# Patient Record
Sex: Male | Born: 1965 | Race: White | Hispanic: No | Marital: Married | Smoking: Never smoker
Health system: Southern US, Community
[De-identification: ages and names within clinical notes are randomized; demographics above are authoritative.]

## PROBLEM LIST (undated history)

## (undated) DIAGNOSIS — T7840XA Allergy, unspecified, initial encounter: Secondary | ICD-10-CM

## (undated) DIAGNOSIS — A6920 Lyme disease, unspecified: Secondary | ICD-10-CM

## (undated) DIAGNOSIS — M1990 Unspecified osteoarthritis, unspecified site: Secondary | ICD-10-CM

## (undated) DIAGNOSIS — N200 Calculus of kidney: Secondary | ICD-10-CM

## (undated) DIAGNOSIS — J189 Pneumonia, unspecified organism: Secondary | ICD-10-CM

## (undated) DIAGNOSIS — I10 Essential (primary) hypertension: Secondary | ICD-10-CM

## (undated) DIAGNOSIS — I319 Disease of pericardium, unspecified: Secondary | ICD-10-CM

## (undated) DIAGNOSIS — J45909 Unspecified asthma, uncomplicated: Secondary | ICD-10-CM

## (undated) DIAGNOSIS — R011 Cardiac murmur, unspecified: Secondary | ICD-10-CM

## (undated) HISTORY — DX: Pneumonia, unspecified organism: J18.9

## (undated) HISTORY — DX: Disease of pericardium, unspecified: I31.9

## (undated) HISTORY — DX: Essential (primary) hypertension: I10

## (undated) HISTORY — DX: Allergy, unspecified, initial encounter: T78.40XA

## (undated) HISTORY — DX: Calculus of kidney: N20.0

## (undated) HISTORY — PX: HERNIA REPAIR: SHX51

## (undated) HISTORY — DX: Cardiac murmur, unspecified: R01.1

## (undated) HISTORY — DX: Unspecified asthma, uncomplicated: J45.909

## (undated) HISTORY — DX: Unspecified osteoarthritis, unspecified site: M19.90

---

## 2002-04-21 ENCOUNTER — Encounter: Payer: Self-pay | Admitting: Family Medicine

## 2002-04-21 ENCOUNTER — Ambulatory Visit (HOSPITAL_COMMUNITY): Admission: RE | Admit: 2002-04-21 | Discharge: 2002-04-21 | Payer: Self-pay | Admitting: Family Medicine

## 2003-03-01 ENCOUNTER — Encounter: Payer: Self-pay | Admitting: Internal Medicine

## 2004-11-06 ENCOUNTER — Ambulatory Visit: Payer: Self-pay | Admitting: Internal Medicine

## 2004-11-18 ENCOUNTER — Ambulatory Visit: Payer: Self-pay | Admitting: Internal Medicine

## 2005-03-12 ENCOUNTER — Ambulatory Visit: Payer: Self-pay | Admitting: Internal Medicine

## 2005-07-31 ENCOUNTER — Ambulatory Visit: Payer: Self-pay | Admitting: Internal Medicine

## 2005-09-01 ENCOUNTER — Ambulatory Visit: Payer: Self-pay | Admitting: Internal Medicine

## 2006-03-02 ENCOUNTER — Ambulatory Visit: Payer: Self-pay | Admitting: Internal Medicine

## 2006-03-08 ENCOUNTER — Encounter: Payer: Self-pay | Admitting: Internal Medicine

## 2006-06-30 ENCOUNTER — Ambulatory Visit: Payer: Self-pay | Admitting: Internal Medicine

## 2006-08-31 ENCOUNTER — Ambulatory Visit: Payer: Self-pay | Admitting: Internal Medicine

## 2007-03-01 ENCOUNTER — Ambulatory Visit: Payer: Self-pay | Admitting: Internal Medicine

## 2008-02-25 DIAGNOSIS — J45909 Unspecified asthma, uncomplicated: Secondary | ICD-10-CM | POA: Insufficient documentation

## 2008-02-25 DIAGNOSIS — J309 Allergic rhinitis, unspecified: Secondary | ICD-10-CM | POA: Insufficient documentation

## 2008-02-25 DIAGNOSIS — R29818 Other symptoms and signs involving the nervous system: Secondary | ICD-10-CM | POA: Insufficient documentation

## 2008-02-28 ENCOUNTER — Ambulatory Visit: Payer: Self-pay | Admitting: Internal Medicine

## 2009-02-26 ENCOUNTER — Ambulatory Visit: Payer: Self-pay | Admitting: Internal Medicine

## 2010-10-18 ENCOUNTER — Emergency Department (HOSPITAL_COMMUNITY)
Admission: EM | Admit: 2010-10-18 | Discharge: 2010-10-18 | Payer: Self-pay | Source: Home / Self Care | Admitting: Emergency Medicine

## 2010-10-18 LAB — BASIC METABOLIC PANEL
BUN: 11 mg/dL (ref 6–23)
CO2: 30 mEq/L (ref 19–32)
Calcium: 9.5 mg/dL (ref 8.4–10.5)
Chloride: 105 mEq/L (ref 96–112)
Creatinine, Ser: 0.97 mg/dL (ref 0.4–1.5)
GFR calc Af Amer: >60 mL/min (ref >60)
GFR calc non Af Amer: >60 mL/min (ref >60)
Glucose, Bld: 107 mg/dL — ABNORMAL HIGH (ref 70–99)
Potassium: 4.2 mEq/L (ref 3.5–5.1)
Sodium: 141 mEq/L (ref 135–145)

## 2010-10-18 LAB — SEDIMENTATION RATE: Sed Rate: 5 mm/hr (ref 0–16)

## 2010-10-18 LAB — DIFFERENTIAL
Basophils Absolute: 0 10*3/uL (ref 0.0–0.1)
Basophils Relative: 0 % (ref 0–1)
Eosinophils Absolute: 0 10*3/uL (ref 0.0–0.7)
Eosinophils Relative: 0 % (ref 0–5)
Lymphocytes Relative: 8 % — ABNORMAL LOW (ref 12–46)
Lymphs Abs: 1 10*3/uL (ref 0.7–4.0)
Monocytes Absolute: 1.1 10*3/uL — ABNORMAL HIGH (ref 0.1–1.0)
Monocytes Relative: 8 % (ref 3–12)
Neutro Abs: 10.3 10*3/uL — ABNORMAL HIGH (ref 1.7–7.7)
Neutrophils Relative %: 83 % — ABNORMAL HIGH (ref 43–77)

## 2010-10-18 LAB — CBC
HCT: 41.7 % (ref 39.0–52.0)
Hemoglobin: 14.3 g/dL (ref 13.0–17.0)
MCH: 28.5 pg (ref 26.0–34.0)
MCHC: 34.3 g/dL (ref 30.0–36.0)
MCV: 83.1 fL (ref 78.0–100.0)
Platelets: 252 10*3/uL (ref 150–400)
RBC: 5.02 MIL/uL (ref 4.22–5.81)
RDW: 13.3 % (ref 11.5–15.5)
WBC: 12.4 10*3/uL — ABNORMAL HIGH (ref 4.0–10.5)

## 2010-10-18 LAB — PROTIME-INR
INR: 0.99 (ref 0.00–1.49)
Prothrombin Time: 13.3 seconds (ref 11.6–15.2)

## 2010-10-18 LAB — CK TOTAL AND CKMB (NOT AT ARMC)
CK, MB: 1.3 ng/mL (ref 0.3–4.0)
Relative Index: INVALID (ref 0.0–2.5)
Total CK: 90 U/L (ref 7–232)

## 2010-10-18 LAB — TROPONIN I: Troponin I: 0.01 ng/mL (ref 0.00–0.06)

## 2010-10-18 LAB — APTT: aPTT: 34 seconds (ref 24–37)

## 2011-02-25 NOTE — Assessment & Plan Note (Signed)
[facility]                             PULMONARY OFFICE NOTE   NAME:[name] [name] CWIKLA                      MRN:          [id number]  DATE:03/01/2007                            DOB:          1966-04-01    PULMONARY OFFICE FOLLOW-UP:   PROBLEMS:  1. Allergic rhinitis.  2. Asthma.  3. Musculoskeletal pain.   HISTORY:  He dropped off his allergy vaccine in February to see how he  would do.  He put off restarting it when he realized there was no pollen  problem in the winter and now the spring pollens have caused itching and  burning of eyes, watering and tearing, nasal congestion and sneezing.  He is using an antihistamine, usually Claritin, rare need for Advair,  which he has used only as a p.r.n. medication.  We discussed this.   MEDICATIONS:  1. Advair 100/50 mcg, not being used.  2. Occasional Claritin.   No medication allergy.   OBJECTIVE:  VITAL SIGNS:  Weight 170 pounds, BP 128/84, pulse 63, room  air saturation 97%.  HEENT:  Conjunctivae are clear now with no excessive lacrimation, no  significant nasal congestion.  CHEST:  Clear.  HEART:  Heart sounds normal.   IMPRESSION:  1. Seasonal exacerbation of allergic rhinitis.  2. Minimal intermittent asthma.   PLAN:  We discussed conservative measures.  I suggested that since he  was off of allergy vaccine we watch to see how he would do through the  summer and next winter, reserving the option to restart vaccine.  I do  not think he is going to be consistent about sticking with it unless he  really feels he needs it.  We are considering Advair discontinued and he  is going to try cheaper albuterol HFA two puffs q.i.d. as a p.r.n.  rescue medication, which we discussed carefully.  Saline lavage.  Schedule return 1 year, earlier p.r.n.    Clinton D. Maple Hudson, MD, Tonny Bollman, FACP  Electronically Signed   CDY/MedQ  DD: 03/08/2007  DT: 03/08/2007  Job #: [id number]
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
## 2011-09-27 ENCOUNTER — Ambulatory Visit (INDEPENDENT_AMBULATORY_CARE_PROVIDER_SITE_OTHER): Payer: 59

## 2011-09-27 DIAGNOSIS — S90569A Insect bite (nonvenomous), unspecified ankle, initial encounter: Secondary | ICD-10-CM

## 2011-09-27 DIAGNOSIS — J209 Acute bronchitis, unspecified: Secondary | ICD-10-CM

## 2011-09-27 DIAGNOSIS — J9801 Acute bronchospasm: Secondary | ICD-10-CM

## 2011-11-21 ENCOUNTER — Ambulatory Visit (INDEPENDENT_AMBULATORY_CARE_PROVIDER_SITE_OTHER): Payer: 59 | Admitting: Physician Assistant

## 2011-11-21 VITALS — BP 125/75 | HR 62 | Temp 97.8°F | Resp 16 | Ht 68.0 in | Wt 177.0 lb

## 2011-11-21 DIAGNOSIS — J069 Acute upper respiratory infection, unspecified: Secondary | ICD-10-CM

## 2011-11-21 MED ORDER — PROMETHAZINE-DM 6.25-15 MG/5ML PO SYRP: 5.0000 mL | ORAL_SOLUTION | Freq: Every day | ORAL | Status: AC

## 2011-11-21 MED ORDER — IPRATROPIUM BROMIDE 0.06 % NA SOLN: 2.0000 | Freq: Four times a day (QID) | NASAL | Status: DC

## 2011-11-21 NOTE — Progress Notes (Signed)
  Subjective:    Patient ID: [name] [name], male    DOB: [birth year], 46 y.o.   MRN: [id number]  HPI [name] [name] c/o uri sx for 5 days.  St, head congestion with thick drainage, cough.  ST has resolved. Feels chest and nose are burning.  Using Mucinex D   Review of Systems  Constitutional: Negative for fever and chills.  HENT: Positive for congestion and sore throat. Negative for ear pain.   Respiratory: Positive for cough and wheezing.   Neurological: Positive for headaches.       Objective:   Physical Exam  Constitutional: He appears well-developed and well-nourished.  HENT:  Right Ear: Tympanic membrane normal.  Left Ear: Tympanic membrane normal.  Nose: Mucosal edema present.  Mouth/Throat: Posterior oropharyngeal erythema present. No posterior oropharyngeal edema.  Cardiovascular: Normal rate and regular rhythm.   Pulmonary/Chest: Effort normal. He has wheezes (faint) in the right lower field and the left lower field.          Assessment & Plan:   1. URI (upper respiratory infection)  promethazine-dextromethorphan (PROMETHAZINE-DM) 6.25-15 MG/5ML syrup, ipratropium (ATROVENT) 0.06 % nasal spray   Use Mucinex D Use Albuterol every 4-6 hours Call if symptoms worsen

## 2011-11-21 NOTE — Patient Instructions (Signed)
Use Mucinex D Use Albuterol every 4-6 hours Call if symptoms worsen

## 2011-11-28 ENCOUNTER — Ambulatory Visit (INDEPENDENT_AMBULATORY_CARE_PROVIDER_SITE_OTHER): Payer: 59 | Admitting: Family Medicine

## 2011-11-28 VITALS — BP 137/83 | HR 91 | Temp 98.0°F | Resp 16 | Ht 67.0 in | Wt 177.2 lb

## 2011-11-28 DIAGNOSIS — J069 Acute upper respiratory infection, unspecified: Secondary | ICD-10-CM

## 2011-11-28 DIAGNOSIS — J019 Acute sinusitis, unspecified: Secondary | ICD-10-CM

## 2011-11-28 MED ORDER — AMOXICILLIN 500 MG PO CAPS: 1000.0000 mg | ORAL_CAPSULE | Freq: Two times a day (BID) | ORAL | Status: AC

## 2011-11-28 MED ORDER — ALBUTEROL SULFATE HFA 108 (90 BASE) MCG/ACT IN AERS: 2.0000 | INHALATION_SPRAY | Freq: Four times a day (QID) | RESPIRATORY_TRACT | Status: DC | PRN

## 2011-11-28 NOTE — Patient Instructions (Signed)
Drink lots of fluids. Interestingly well hydrated the secretions will be thinner. Take antibiotics as ordered. Continue using the fluticasone nose spray. Return if worse

## 2011-11-28 NOTE — Progress Notes (Signed)
  Subjective:    Patient ID: [name] [name], male    DOB: 01-24-1966, 46 y.o.   MRN: [id number]  HPI Patient had a URI for 10 days. He came here week ago and was just treated symptomatically for viral infection. It has persisted. Blowing out a lot of green snot out of his nose.   Review of Systems    no fever. No ear problems except for a little tinnitus on the left. There is no longer sore. He only has a minimal cough. Objective:   Physical Exam TMs normal nose congested tender over his maxillary and frontal sinuses throat clear neck supple without significant nodes chest is clear to auscultation heart regular without any murmurs.      Assessment & Plan:  URI with secondary sinusitis  No labs were ordered he will be treated with antibiotics.

## 2011-12-11 ENCOUNTER — Ambulatory Visit (INDEPENDENT_AMBULATORY_CARE_PROVIDER_SITE_OTHER): Payer: 59 | Admitting: Physician Assistant

## 2011-12-11 VITALS — BP 144/80 | HR 77 | Temp 98.8°F | Resp 16 | Ht 67.0 in | Wt 179.0 lb

## 2011-12-11 DIAGNOSIS — J019 Acute sinusitis, unspecified: Secondary | ICD-10-CM

## 2011-12-11 DIAGNOSIS — J45909 Unspecified asthma, uncomplicated: Secondary | ICD-10-CM

## 2011-12-11 MED ORDER — HYDROCOD POLST-CHLORPHEN POLST 10-8 MG/5ML PO LQCR: 5.0000 mL | Freq: Two times a day (BID) | ORAL | Status: DC | PRN

## 2011-12-11 MED ORDER — PREDNISONE 20 MG PO TABS: ORAL_TABLET | ORAL | Status: AC

## 2011-12-11 MED ORDER — MOXIFLOXACIN HCL 400 MG PO TABS: 400.0000 mg | ORAL_TABLET | Freq: Every day | ORAL | Status: AC
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
## 2011-12-11 NOTE — Patient Instructions (Signed)
Rest.  Drink at least 64 ounces of water each day.  You MAY add Crystal Light to the water.

## 2011-12-12 ENCOUNTER — Encounter: Payer: Self-pay | Admitting: Physician Assistant

## 2011-12-16 ENCOUNTER — Encounter: Payer: Self-pay | Admitting: Physician Assistant

## 2012-08-18 ENCOUNTER — Ambulatory Visit (INDEPENDENT_AMBULATORY_CARE_PROVIDER_SITE_OTHER): Payer: 59 | Admitting: Internal Medicine

## 2012-08-18 ENCOUNTER — Encounter: Payer: Self-pay | Admitting: Internal Medicine

## 2012-08-18 VITALS — BP 118/72 | HR 62 | Temp 99.3°F | Resp 16 | Ht 67.0 in | Wt 174.2 lb

## 2012-08-18 DIAGNOSIS — Z0000 Encounter for general adult medical examination without abnormal findings: Secondary | ICD-10-CM

## 2012-08-18 LAB — COMPREHENSIVE METABOLIC PANEL
ALT: 28 U/L (ref 0–53)
AST: 13 U/L (ref 0–37)
Albumin: 4.5 g/dL (ref 3.5–5.2)
Alkaline Phosphatase: 72 U/L (ref 39–117)
BUN: 12 mg/dL (ref 6–23)
CO2: 28 mEq/L (ref 19–32)
Calcium: 9.8 mg/dL (ref 8.4–10.5)
Chloride: 101 mEq/L (ref 96–112)
Creat: 0.85 mg/dL (ref 0.50–1.35)
Glucose, Bld: 113 mg/dL — ABNORMAL HIGH (ref 70–99)
Potassium: 4.7 mEq/L (ref 3.5–5.3)
Sodium: 138 mEq/L (ref 135–145)
Total Bilirubin: 0.7 mg/dL (ref 0.3–1.2)
Total Protein: 7 g/dL (ref 6.0–8.3)

## 2012-08-18 LAB — LIPID PANEL
Cholesterol: 218 mg/dL — ABNORMAL HIGH (ref 0–200)
HDL: 43 mg/dL (ref >39)
LDL Cholesterol: 153 mg/dL — ABNORMAL HIGH (ref 0–99)
Total CHOL/HDL Ratio: 5.1 Ratio
Triglycerides: 109 mg/dL (ref <150)
VLDL: 22 mg/dL (ref 0–40)

## 2012-08-18 LAB — POCT URINALYSIS DIPSTICK
Bilirubin, UA: NEGATIVE
Blood, UA: NEGATIVE
Glucose, UA: NEGATIVE
Ketones, UA: NEGATIVE
Leukocytes, UA: NEGATIVE
Nitrite, UA: NEGATIVE
Protein, UA: NEGATIVE
Spec Grav, UA: 1.015
Urobilinogen, UA: 0.2
pH, UA: 7.5

## 2012-08-18 LAB — CBC WITH DIFFERENTIAL/PLATELET
Basophils Absolute: 0 10*3/uL (ref 0.0–0.1)
Basophils Relative: 1 % (ref 0–1)
Eosinophils Absolute: 0.2 10*3/uL (ref 0.0–0.7)
Eosinophils Relative: 4 % (ref 0–5)
HCT: 42.9 % (ref 39.0–52.0)
Hemoglobin: 15 g/dL (ref 13.0–17.0)
Lymphocytes Relative: 21 % (ref 12–46)
Lymphs Abs: 1.3 10*3/uL (ref 0.7–4.0)
MCH: 27.8 pg (ref 26.0–34.0)
MCHC: 35 g/dL (ref 30.0–36.0)
MCV: 79.6 fL (ref 78.0–100.0)
Monocytes Absolute: 0.5 10*3/uL (ref 0.1–1.0)
Monocytes Relative: 8 % (ref 3–12)
Neutro Abs: 4.4 10*3/uL (ref 1.7–7.7)
Neutrophils Relative %: 66 % (ref 43–77)
Platelets: 339 10*3/uL (ref 150–400)
RBC: 5.39 MIL/uL (ref 4.22–5.81)
RDW: 13.7 % (ref 11.5–15.5)
WBC: 6.5 10*3/uL (ref 4.0–10.5)

## 2012-08-18 LAB — PSA: PSA: 1.73 ng/mL (ref <=4.00)

## 2012-08-18 LAB — IFOBT (OCCULT BLOOD): IFOBT: NEGATIVE

## 2012-08-18 MED ORDER — TRIAMCINOLONE 0.1 % CREAM:EUCERIN CREAM 1:1: 1.0000 "application " | TOPICAL_CREAM | Freq: Two times a day (BID) | CUTANEOUS | Status: DC
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
## 2012-08-20 ENCOUNTER — Encounter: Payer: Self-pay | Admitting: Internal Medicine

## 2012-08-22 ENCOUNTER — Other Ambulatory Visit: Payer: Self-pay | Admitting: Physician Assistant

## 2012-08-22 MED ORDER — TRIAMCINOLONE ACETONIDE 0.1 % EX CREA: TOPICAL_CREAM | Freq: Two times a day (BID) | CUTANEOUS | Status: DC

## 2012-08-27 ENCOUNTER — Ambulatory Visit: Payer: 59

## 2012-08-27 ENCOUNTER — Ambulatory Visit (INDEPENDENT_AMBULATORY_CARE_PROVIDER_SITE_OTHER): Payer: 59 | Admitting: Emergency Medicine

## 2012-08-27 ENCOUNTER — Encounter: Payer: Self-pay | Admitting: Physician Assistant

## 2012-08-27 VITALS — BP 120/76 | HR 71 | Temp 98.0°F | Resp 16 | Ht 67.0 in | Wt 178.2 lb

## 2012-08-27 DIAGNOSIS — R0981 Nasal congestion: Secondary | ICD-10-CM

## 2012-08-27 DIAGNOSIS — J3489 Other specified disorders of nose and nasal sinuses: Secondary | ICD-10-CM

## 2012-08-27 DIAGNOSIS — R05 Cough: Secondary | ICD-10-CM

## 2012-08-27 DIAGNOSIS — R059 Cough, unspecified: Secondary | ICD-10-CM

## 2012-08-27 DIAGNOSIS — J189 Pneumonia, unspecified organism: Secondary | ICD-10-CM

## 2012-08-27 LAB — POCT CBC
Granulocyte percent: 72.2 %G (ref 37–80)
HCT, POC: 44.7 % (ref 43.5–53.7)
Hemoglobin: 14 g/dL — AB (ref 14.1–18.1)
Lymph, poc: 2.5 (ref 0.6–3.4)
MCH, POC: 27.4 pg (ref 27–31.2)
MCHC: 31.3 g/dL — AB (ref 31.8–35.4)
MCV: 87.4 fL (ref 80–97)
MID (cbc): 0.8 (ref 0–0.9)
MPV: 7.6 fL (ref 0–99.8)
POC Granulocyte: 8.7 — AB (ref 2–6.9)
POC LYMPH PERCENT: 20.9 %L (ref 10–50)
POC MID %: 6.9 %M (ref 0–12)
Platelet Count, POC: 409 10*3/uL (ref 142–424)
RBC: 5.11 M/uL (ref 4.69–6.13)
RDW, POC: 13.7 %
WBC: 12 10*3/uL — AB (ref 4.6–10.2)

## 2012-08-27 MED ORDER — LEVOFLOXACIN 500 MG PO TABS: 500.0000 mg | ORAL_TABLET | Freq: Every day | ORAL | Status: DC

## 2012-08-27 MED ORDER — BENZONATATE 100 MG PO CAPS: 100.0000 mg | ORAL_CAPSULE | Freq: Three times a day (TID) | ORAL | Status: DC | PRN

## 2012-08-27 MED ORDER — CEFTRIAXONE SODIUM 1 G IJ SOLR
1.0000 g | INTRAMUSCULAR | Status: DC
  Administered 2012-08-27: 1 g via INTRAMUSCULAR

## 2012-08-27 MED ORDER — HYDROCOD POLST-CHLORPHEN POLST 10-8 MG/5ML PO LQCR: 5.0000 mL | Freq: Two times a day (BID) | ORAL | Status: DC | PRN

## 2012-08-27 MED ORDER — IPRATROPIUM BROMIDE 0.03 % NA SOLN: 2.0000 | Freq: Two times a day (BID) | NASAL | Status: DC

## 2012-08-27 NOTE — Patient Instructions (Addendum)
We have given you a shot of antibiotics in the office.  Begin taking the Levaquin (also an antibiotic) today.  Call us in 48 hours and let us know how you are doing.  If you are worsening (start running a fever, worsening cough, new symptoms) come back in sooner.    Use Tessalon for cough during the day and Tussionex for cough at night.  Tussionex may make you sleepy, so do not take this during the day.  Continue using Mucinex twice a day (use the plain Mucinex, not the -D).  I have sent Atrovent nasal spray to the pharmacy, you may use this for nasal congestion if needed.  I would begin using a nasal saline spray to keep the nasal membrane moist.  I think you are experiencing nosebleeds due to dryness.

## 2012-08-27 NOTE — Progress Notes (Signed)
Subjective:    Patient ID: [name] [name], male    DOB: [birth year], 46 y.o.   MRN: [id number]  HPI  [name] [name] is a 46 yr old male with 1 week of URI symptoms.  He felt like he was improving midweek, but is now worsening.  Symptoms began 6 days ago with sore throat and sneezing.  Sore throat has largely resolved, but he now has sinus pressure, rhinorrhea, epistaxis, and productive cough.  He is experiencing thick greenish nasal drainage that is sometimes blood tinged.  He is experiencing some epistaxis from the right nare.  He feels like the cough is worsening.  He is coughing up "brown chunky" mucus, occasionally blood tinged.  He states he's been feeling hot and cold but has not had a fever.  Does have some body aches.  He has had a flu shot.  Has been using Mucinex, Nyquil, Tylenol cold all with minimal relief.  He does have a history of well controlled asthma but has been needing albuterol more frequently.     Review of Systems  Constitutional: Negative for fever and chills.  HENT: Positive for nosebleeds, congestion, rhinorrhea and sinus pressure. Negative for ear pain, sore throat, neck pain, neck stiffness and postnasal drip.   Respiratory: Positive for cough, wheezing and stridor.   Cardiovascular: Negative.   Gastrointestinal: Negative.   Musculoskeletal: Positive for myalgias and arthralgias.  Skin: Negative.   Neurological: Positive for headaches. Negative for dizziness, syncope and light-headedness.       Objective:   Physical Exam  Vitals reviewed. Constitutional: He is oriented to person, place, and time. He appears well-developed and well-nourished. No distress.  HENT:  Head: Normocephalic and atraumatic.  Right Ear: Tympanic membrane and ear canal normal.  Left Ear: Tympanic membrane and ear canal normal.  Nose: Mucosal edema and rhinorrhea present. Right sinus exhibits no maxillary sinus tenderness and no frontal sinus tenderness. Left sinus exhibits no maxillary sinus  tenderness and no frontal sinus tenderness.  Mouth/Throat: Uvula is midline, oropharynx is clear and moist and mucous membranes are normal.       Some dried blood in the right nare  Cardiovascular: Normal rate, regular rhythm, normal heart sounds and intact distal pulses.  Exam reveals no gallop and no friction rub.   No murmur heard. Pulmonary/Chest: Effort normal. No accessory muscle usage. Not tachypneic. No respiratory distress. He has no decreased breath sounds. He has wheezes in the left lower field. He has no rhonchi. He has no rales.  Neurological: He is alert and oriented to person, place, and time.  Skin: Skin is warm and dry.  Psychiatric: He has a normal mood and affect. His behavior is normal.     Filed Vitals:   08/27/12 1038  BP: 120/76  Pulse: 71  Temp: 98 F (36.7 C)  Resp: 16      Results for orders placed in visit on 08/27/12  POCT CBC      Component Value Range   WBC 12.0 (*) 4.6 - 10.2 K/uL   Lymph, poc 2.5  0.6 - 3.4   POC LYMPH PERCENT 20.9  10 - 50 %L   MID (cbc) 0.8  0 - 0.9   POC MID % 6.9  0 - 12 %M   POC Granulocyte 8.7 (*) 2 - 6.9   Granulocyte percent 72.2  37 - 80 %G   RBC 5.11  4.69 - 6.13 M/uL   Hemoglobin 14.0 (*) 14.1 - 18.1 g/dL   HCT,  POC 44.7  43.5 - 53.7 %   MCV 87.4  80 - 97 fL   MCH, POC 27.4  27 - 31.2 pg   MCHC 31.3 (*) 31.8 - 35.4 g/dL   RDW, POC 16.1     Platelet Count, POC 409  142 - 424 K/uL   MPV 7.6  0 - 99.8 fL     [facility] reading (PRIMARY) by  Dr. Cleta Alberts - RUL infiltrate and lingular infiltrate.       Assessment & Plan:   1. Cough  POCT CBC, DG Chest 2 View, benzonatate (TESSALON) 100 MG capsule, chlorpheniramine-HYDROcodone (TUSSIONEX PENNKINETIC ER) 10-8 MG/5ML LQCR  2. Pneumonia  levofloxacin (LEVAQUIN) 500 MG tablet, cefTRIAXone (ROCEPHIN) injection 1 g  3. Nasal congestion  ipratropium (ATROVENT) 0.03 % nasal spray    [name] [name] is a 46 yr old male with pneumonia.  Right upper lobe and lingular infiltrates  visible on CXR.  Have given 1g Rocephin here in the office, and will treat with PO Levaquin.  He is afebrile and well appearing in clinic today.  Lungs are CTA aside from a few wheezes.  Vitals are WNL, pulse ox 97% on RA.  Have instructed him to call us in 48 hours and let us know how he is doing.  If he is not improving at that time, he will need to come back in.  Gave clear instructions to RTC sooner if worsening or new symptoms develop.  He voiced understanding and is in agreement with this plan.  I have sent Tessalon, Tussionex, and Atrovent for symptom relief.  Encouraged nasal saline as I suspect dryness is contributing to his intermittent epistaxis from the right nare.  Encouraged fluids and rest.

## 2012-08-30 ENCOUNTER — Telehealth: Payer: Self-pay | Admitting: Physician Assistant

## 2012-08-30 NOTE — Telephone Encounter (Signed)
Please call patient and see how he is doing.  Hopefully he is improving on the antibiotics, if not, he should RTC

## 2012-08-30 NOTE — Telephone Encounter (Signed)
Left message for patient to return call.

## 2012-09-01 NOTE — Telephone Encounter (Signed)
Please try patient again to ensure that he is responding to abx

## 2012-09-02 NOTE — Telephone Encounter (Signed)
Yes, patient is feeling better. He is advised to call if he needs anything further

## 2012-09-07 ENCOUNTER — Telehealth: Payer: Self-pay | Admitting: Radiology

## 2012-09-07 NOTE — Telephone Encounter (Signed)
Please give me a call at [phone] and I will try to answer questions regarding this patient

## 2012-09-07 NOTE — Telephone Encounter (Signed)
Dr Cleta Alberts, I have a form for [employer] I have filled out for patient, I need your help with this, I have a couple of questions. Thank you Nechelle Petrizzo

## 2012-10-19 ENCOUNTER — Telehealth: Payer: Self-pay

## 2012-11-08 ENCOUNTER — Other Ambulatory Visit: Payer: Self-pay | Admitting: Podiatrist

## 2012-11-08 DIAGNOSIS — M7660 Achilles tendinitis, unspecified leg: Secondary | ICD-10-CM

## 2012-11-10 ENCOUNTER — Ambulatory Visit
Admission: RE | Admit: 2012-11-10 | Discharge: 2012-11-10 | Disposition: A | Discharge status: Home / Self Care | Payer: Managed Care, Other (non HMO) | Source: Ambulatory Visit | Attending: Podiatrist | Admitting: Podiatrist

## 2012-11-10 DIAGNOSIS — M7660 Achilles tendinitis, unspecified leg: Secondary | ICD-10-CM

## 2012-11-10 MED ORDER — GADOBENATE DIMEGLUMINE 529 MG/ML IV SOLN
7.0000 mL | Freq: Once | INTRAVENOUS | Status: AC | PRN
  Administered 2012-11-10: 7 mL via INTRAVENOUS

## 2012-11-27 ENCOUNTER — Other Ambulatory Visit: Payer: Self-pay

## 2012-12-01 ENCOUNTER — Encounter (HOSPITAL_COMMUNITY): Payer: Self-pay | Admitting: *Deleted

## 2012-12-01 ENCOUNTER — Emergency Department (HOSPITAL_COMMUNITY)
Admission: EM | Admit: 2012-12-01 | Discharge: 2012-12-01 | Disposition: A | Discharge status: Home / Self Care | Payer: Worker's Compensation | Attending: Emergency Medicine | Admitting: Emergency Medicine

## 2012-12-01 ENCOUNTER — Emergency Department (HOSPITAL_COMMUNITY): Payer: Worker's Compensation

## 2012-12-01 DIAGNOSIS — Z79899 Other long term (current) drug therapy: Secondary | ICD-10-CM | POA: Insufficient documentation

## 2012-12-01 DIAGNOSIS — Y929 Unspecified place or not applicable: Secondary | ICD-10-CM | POA: Insufficient documentation

## 2012-12-01 DIAGNOSIS — W1789XA Other fall from one level to another, initial encounter: Secondary | ICD-10-CM | POA: Insufficient documentation

## 2012-12-01 DIAGNOSIS — IMO0002 Reserved for concepts with insufficient information to code with codable children: Secondary | ICD-10-CM | POA: Insufficient documentation

## 2012-12-01 DIAGNOSIS — S2232XB Fracture of one rib, left side, initial encounter for open fracture: Secondary | ICD-10-CM

## 2012-12-01 DIAGNOSIS — R109 Unspecified abdominal pain: Secondary | ICD-10-CM | POA: Insufficient documentation

## 2012-12-01 DIAGNOSIS — S2239XA Fracture of one rib, unspecified side, initial encounter for closed fracture: Secondary | ICD-10-CM | POA: Insufficient documentation

## 2012-12-01 DIAGNOSIS — Y9301 Activity, walking, marching and hiking: Secondary | ICD-10-CM | POA: Insufficient documentation

## 2012-12-01 MED ORDER — DIAZEPAM 5 MG PO TABS
5.0000 mg | ORAL_TABLET | Freq: Once | ORAL | Status: AC
  Administered 2012-12-01: 5 mg via ORAL
  Filled 2012-12-01: qty 1

## 2012-12-01 MED ORDER — HYDROCODONE-ACETAMINOPHEN 5-325 MG PO TABS
2.0000 | ORAL_TABLET | Freq: Once | ORAL | Status: AC
  Administered 2012-12-01: 2 via ORAL
  Filled 2012-12-01: qty 2

## 2012-12-01 MED ORDER — METHOCARBAMOL 500 MG PO TABS: 500.0000 mg | ORAL_TABLET | Freq: Three times a day (TID) | ORAL | Status: DC

## 2012-12-01 MED ORDER — HYDROCODONE-ACETAMINOPHEN 5-325 MG PO TABS: ORAL_TABLET | ORAL | Status: DC

## 2012-12-01 NOTE — ED Provider Notes (Signed)
History  This chart was scribed for non-physician practitioner working with Flint Melter, MD by Ardeen Jourdain, ED Scribe. This patient was seen in room TR11C/TR11C and the patient's care was started at 2115.  CSN: [id number]  Arrival date & time 12/01/12  1849   None     Chief Complaint  Patient presents with  . Fall    Patient is a 47 y.o. male presenting with fall. The history is provided by the patient. No language interpreter was used.  Fall The accident occurred 3 to 5 hours ago. The fall occurred while walking. He fell from a height of 3 to 5 ft. There was no blood loss. He was ambulatory at the scene. There was no entrapment after the fall. There was no drug use involved in the accident. There was no alcohol use involved in the accident. Associated symptoms include abdominal pain. Pertinent negatives include no numbness, no bowel incontinence, no nausea, no vomiting, no hematuria, no headaches, no hearing loss, no loss of consciousness and no tingling.    [name] [name] is a 48 y.o. male who presents to the Emergency Department complaining of left rib pain from a fall. He states he was leaning over a metal bar when his feet slipped and hit his side on the bar. He denies any other injuries at this time. He states his pain is aggravated by deep breaths. He rates the pain at a 4 out of 10. He states he has fractured his ribs in the past and the current pain feels similar.    History reviewed. No pertinent past medical history.  History reviewed. No pertinent past surgical history.  Family History  Problem Relation Age of Onset  . Diabetes Mother   . Cancer Mother     CHF  . Heart disease Mother     CHF  . Cancer Father     prostate  . Diabetes Father   . Kidney disease Father     CANCER  . Stroke Daughter   . Diabetes Maternal Grandmother   . Heart disease Maternal Grandfather     HEART ATTACK    History  Substance Use Topics  . Smoking status: Never Smoker   .  Smokeless tobacco: Not on file  . Alcohol Use: No      Review of Systems  Gastrointestinal: Positive for abdominal pain. Negative for nausea, vomiting and bowel incontinence.  Genitourinary: Negative for hematuria.  Skin: Negative for wound.  Neurological: Negative for tingling, loss of consciousness, weakness, numbness and headaches.  All other systems reviewed and are negative.    Allergies  Review of patient's allergies indicates no known allergies.  Home Medications   Current Outpatient Rx  Name  Route  Sig  Dispense  Refill  . albuterol (PROVENTIL HFA;VENTOLIN HFA) 108 (90 BASE) MCG/ACT inhaler   Inhalation   Inhale 2 puffs into the lungs every 6 (six) hours as needed for wheezing.         . fluticasone (FLONASE) 50 MCG/ACT nasal spray   Nasal   Place 2 sprays into the nose as needed for rhinitis or allergies.          . Fluticasone-Salmeterol (ADVAIR) 250-50 MCG/DOSE AEPB   Inhalation   Inhale 1 puff into the lungs 2 (two) times daily as needed (allergies).          Marland Kitchen ipratropium (ATROVENT) 0.03 % nasal spray   Nasal   Place 2 sprays into the nose 2 (two) times daily  as needed for rhinitis.         . MELOXICAM PO   Oral   Take 1 tablet by mouth 2 (two) times daily as needed (rheumatoid arthritis).           Triage Vitals: BP 146/81  Pulse 72  Temp(Src) 98 F (36.7 C) (Oral)  Resp 18  SpO2 98%  Physical Exam  Nursing note and vitals reviewed. Constitutional: He is oriented to person, place, and time. He appears well-developed and well-nourished. No distress.  HENT:  Head: Normocephalic and atraumatic.  Eyes: EOM are normal. Pupils are equal, round, and reactive to light.  Neck: Normal range of motion. Neck supple. No tracheal deviation present.  Cardiovascular: Normal rate, regular rhythm and normal heart sounds.  Exam reveals no gallop and no friction rub.   No murmur heard. Pulmonary/Chest: Effort normal and breath sounds normal. No  respiratory distress. He has no wheezes. He has no rales. He exhibits tenderness.  Anterior lateral left chest wall tenderness, no crepitance, no bruise noted, symmetrical rise and fall of the chest, speaks in complete sentances  Abdominal: Soft. Bowel sounds are normal. He exhibits no distension and no mass. There is no tenderness. There is no rebound and no guarding.  Musculoskeletal: Normal range of motion. He exhibits no edema.  Neurological: He is alert and oriented to person, place, and time.  Skin: Skin is warm and dry.  Psychiatric: He has a normal mood and affect. His behavior is normal.    ED Course  Procedures : FRACTURE CARE - RIBS.   Patient identified by arm band. Procedural time out taken before care for fracture of the left 10th rib.  The patient had a fall today at work and injured the left rib on a metal bar. X-ray reveals a chip fracture of the left 10th rib. The findings were discussed with the patient in terms which he understood. Questions were answered.  Incentives barometer was ordered for the patient and he was given instructions on its use of 3-4 times daily. Prescription for Robaxin 3 times daily and Norco every 4 hours given to the patient. Patient given instructions to return if any changes or problems reported. Any hemoptysis or fever. Patient verbalized understanding of instructions.  DIAGNOSTIC STUDIES: Oxygen Saturation is 98% on room air, normal by my interpretation.    COORDINATION OF CARE:  9:23 PM: Discussed treatment plan which includes x-ray of the left ribs and chest with pt at bedside and pt agreed to plan.     Labs Reviewed - No data to display Dg Ribs Unilateral W/chest Left  12/01/2012  *RADIOLOGY REPORT*  Clinical Data: Left-sided rib pain.  LEFT RIBS AND CHEST - 3+ VIEW  Comparison: Chest x-ray 10/18/2010.  Findings: The cardiac silhouette, mediastinal and hilar contours are stable.  The lungs are clear.  No pleural effusion, pneumothorax or  pulmonary contusion.  There are remote left-sided healed rib fractures.  Dedicated views of the left ribs demonstrate a subtle fracture involving the anterior aspect of the tenth rib.  Remote healed fractures are noted.  IMPRESSION:  1.  Subtle fracture violating anterior aspect of the left tenth rib. 2.  Remote healed rib fractures. 3.  No acute cardiopulmonary findings.   Original Report Authenticated By: Rudie Meyer, M.D.      No diagnosis found.    MDM  I have reviewed nursing notes, vital signs, and all appropriate lab and imaging results for this patient. The x-ray of the left ribs  and chest reveals a chip fracture of the anterior aspect of the left 10th rib. The lungs were within normal limits. Patient was given Insurance account manager instructions. He is given a prescription for Robaxin and Norco. He is to return if any changes, problems, or concerns.       Kathie Dike, [place] 12/01/12 2206

## 2012-12-01 NOTE — ED Notes (Signed)
Reports falling at work today, hit left ribs on metal bar and now having pain. No resp distress noted. Airway intact

## 2012-12-02 NOTE — ED Provider Notes (Signed)
Medical screening examination/treatment/procedure(s) were performed by non-physician practitioner and as supervising physician I was immediately available for consultation/collaboration.   Flint Melter, MD 12/02/12 Jacinta Shoe

## 2013-03-25 ENCOUNTER — Ambulatory Visit (INDEPENDENT_AMBULATORY_CARE_PROVIDER_SITE_OTHER): Payer: Managed Care, Other (non HMO) | Admitting: Family Medicine

## 2013-03-25 VITALS — BP 125/77 | HR 57 | Temp 98.0°F | Resp 17 | Wt 178.0 lb

## 2013-03-25 DIAGNOSIS — J209 Acute bronchitis, unspecified: Secondary | ICD-10-CM

## 2013-03-25 DIAGNOSIS — J45909 Unspecified asthma, uncomplicated: Secondary | ICD-10-CM

## 2013-03-25 DIAGNOSIS — J019 Acute sinusitis, unspecified: Secondary | ICD-10-CM

## 2013-03-25 DIAGNOSIS — J302 Other seasonal allergic rhinitis: Secondary | ICD-10-CM

## 2013-03-25 DIAGNOSIS — J309 Allergic rhinitis, unspecified: Secondary | ICD-10-CM

## 2013-03-25 MED ORDER — AZITHROMYCIN 250 MG PO TABS: ORAL_TABLET | ORAL | Status: DC

## 2013-03-25 MED ORDER — ALBUTEROL SULFATE HFA 108 (90 BASE) MCG/ACT IN AERS: 2.0000 | INHALATION_SPRAY | Freq: Four times a day (QID) | RESPIRATORY_TRACT | Status: DC | PRN

## 2013-03-25 MED ORDER — IPRATROPIUM BROMIDE 0.03 % NA SOLN: 2.0000 | Freq: Two times a day (BID) | NASAL | Status: DC

## 2013-03-25 MED ORDER — FLUTICASONE-SALMETEROL 100-50 MCG/DOSE IN AEPB: 1.0000 | INHALATION_SPRAY | Freq: Two times a day (BID) | RESPIRATORY_TRACT | Status: DC

## 2013-03-25 NOTE — Progress Notes (Signed)
[facility]:  Office Visit  Chief Complaint:  Chief Complaint  Patient presents with  . URI    HPI: [name] [name] is a 47 y.o. male who complains of  History allergy and asthma, nose is running, congested after mowing lawn on Wednesday. Has treated sxs otc, Tuesday and Wednesday at night . + thick mucus, sinus congestion. Has tried mucinex, claritin, dayquil, albuterol and advair. He has a h/o of asthma and allergies but does not take Advair regular, just when he needs it. NO wheezing, SOB  Past Medical History  Diagnosis Date  . Allergy   . Asthma   . Heart murmur    Past Surgical History  Procedure Laterality Date  . Hernia repair     History   Social History  . Marital Status: Married    Spouse Name: N/A    Number of Children: N/A  . Years of Education: N/A   Social History Main Topics  . Smoking status: Never Smoker   . Smokeless tobacco: None  . Alcohol Use: No  . Drug Use: No  . Sexually Active: Yes   Other Topics Concern  . None   Social History Narrative  . None   Family History  Problem Relation Age of Onset  . Diabetes Mother   . Cancer Mother     CHF  . Heart disease Mother     CHF  . Cancer Father     prostate  . Diabetes Father   . Kidney disease Father     CANCER  . Stroke Daughter   . Diabetes Maternal Grandmother   . Heart disease Maternal Grandfather     HEART ATTACK   No Known Allergies Prior to Admission medications   Medication Sig Start Date End Date Taking? Authorizing Provider  albuterol (PROVENTIL HFA;VENTOLIN HFA) 108 (90 BASE) MCG/ACT inhaler Inhale 2 puffs into the lungs every 6 (six) hours as needed for wheezing.   Yes Historical Provider, MD  Fluticasone-Salmeterol (ADVAIR) 250-50 MCG/DOSE AEPB Inhale 1 puff into the lungs 2 (two) times daily as needed (allergies).    Yes Historical Provider, MD  ipratropium (ATROVENT) 0.03 % nasal spray Place 2 sprays into the nose 2 (two) times daily as needed for  rhinitis.   Yes Historical Provider, MD  fluticasone (FLONASE) 50 MCG/ACT nasal spray Place 2 sprays into the nose as needed for rhinitis or allergies.     Historical Provider, MD  HYDROcodone-acetaminophen (NORCO/VICODIN) 5-325 MG per tablet 1 OR 2 PO Q4H PRN PAIN 12/01/12   Kathie Dike, PA-C  MELOXICAM PO Take 1 tablet by mouth 2 (two) times daily as needed (rheumatoid arthritis).    Historical Provider, MD  methocarbamol (ROBAXIN) 500 MG tablet Take 1 tablet (500 mg total) by mouth 3 (three) times daily. 12/01/12   Kathie Dike, PA-C     ROS: The patient denies fevers, chills, night sweats, unintentional weight loss, chest pain, palpitations, wheezing, dyspnea on exertion, nausea, vomiting, abdominal pain, dysuria, hematuria, melena, numbness, weakness, or tingling.   All other systems have been reviewed and were otherwise negative with the exception of those mentioned in the HPI and as above.    PHYSICAL EXAM: Filed Vitals:   03/25/13 1144  BP: 125/77  Pulse: 57  Temp: 98 F (36.7 C)  Resp: 17   Filed Vitals:   03/25/13 1144  Weight: 178 lb (80.74 kg)   Body mass index is 27.87 kg/(m^2).  General: Alert, no acute distress  HEENT:  Normocephalic, atraumatic, oropharynx patent. + sinus tenderness Cardiovascular:  Regular rate and rhythm, no rubs murmurs or gallops.  No Carotid bruits, radial pulse intact. No pedal edema.  Respiratory: Clear to auscultation bilaterally.  No wheezes, rales, or rhonchi.  No cyanosis, no use of accessory musculature GI: No organomegaly, abdomen is soft and non-tender, positive bowel sounds.  No masses. Skin: No rashes. Neurologic: Facial musculature symmetric. Psychiatric: Patient is appropriate throughout our interaction. Lymphatic: No cervical lymphadenopathy Musculoskeletal: Gait intact.   LABS: Results for orders placed in visit on 08/27/12  POCT CBC      Result Value Range   WBC 12.0 (*) 4.6 - 10.2 K/uL   Lymph, poc 2.5  0.6 - 3.4    POC LYMPH PERCENT 20.9  10 - 50 %L   MID (cbc) 0.8  0 - 0.9   POC MID % 6.9  0 - 12 %M   POC Granulocyte 8.7 (*) 2 - 6.9   Granulocyte percent 72.2  37 - 80 %G   RBC 5.11  4.69 - 6.13 M/uL   Hemoglobin 14.0 (*) 14.1 - 18.1 g/dL   HCT, POC 16.1  09.6 - 53.7 %   MCV 87.4  80 - 97 fL   MCH, POC 27.4  27 - 31.2 pg   MCHC 31.3 (*) 31.8 - 35.4 g/dL   RDW, POC 04.5     Platelet Count, POC 409  142 - 424 K/uL   MPV 7.6  0 - 99.8 fL     EKG/XRAY:   Primary read interpreted by Dr. Conley Rolls at [facility].   ASSESSMENT/PLAN: Encounter Diagnoses  Name Primary?  . Acute sinusitis Yes  . Acute bronchitis   . Seasonal allergies   . Asthma, chronic, unspecified asthma severity, uncomplicated    Rx Advair Rx Azithromycin Rx Atrovent NS F/u prn    LE, THAO PHUONG, DO 03/25/2013 12:43 PM

## 2013-08-11 ENCOUNTER — Ambulatory Visit (INDEPENDENT_AMBULATORY_CARE_PROVIDER_SITE_OTHER): Payer: Managed Care, Other (non HMO) | Admitting: Emergency Medicine

## 2013-08-11 VITALS — BP 142/76 | HR 92 | Temp 99.3°F | Resp 18 | Ht 66.5 in | Wt 174.2 lb

## 2013-08-11 DIAGNOSIS — L0231 Cutaneous abscess of buttock: Secondary | ICD-10-CM

## 2013-08-11 DIAGNOSIS — K6289 Other specified diseases of anus and rectum: Secondary | ICD-10-CM

## 2013-08-11 MED ORDER — HYDROCODONE-ACETAMINOPHEN 5-325 MG PO TABS: ORAL_TABLET | ORAL | Status: DC

## 2013-08-11 MED ORDER — SULFAMETHOXAZOLE-TRIMETHOPRIM 800-160 MG PO TABS: 1.0000 | ORAL_TABLET | Freq: Two times a day (BID) | ORAL | Status: DC

## 2013-08-11 MED ORDER — METRONIDAZOLE 500 MG PO TABS: 500.0000 mg | ORAL_TABLET | Freq: Three times a day (TID) | ORAL | Status: DC

## 2013-08-11 NOTE — Progress Notes (Signed)
Perianal Abscess Procedural Note  Local anesthesia with 2 cc with 2% plain lidocaine Sterile prep Incision with 11 blade Moderate to large thick purulence expressed Irrigated wound cavity with 3 cc 2% plain lidocaine Gently packed with small amount of 1/4 inch plain packing Cleansed and dressed  Wound culture pending

## 2013-08-11 NOTE — Patient Instructions (Signed)
Abscess An abscess is an infected area that contains a collection of pus and debris.It can occur in almost any part of the body. An abscess is also known as a furuncle or boil. CAUSES  An abscess occurs when tissue gets infected. This can occur from blockage of oil or sweat glands, infection of hair follicles, or a minor injury to the skin. As the body tries to fight the infection, pus collects in the area and creates pressure under the skin. This pressure causes pain. People with weakened immune systems have difficulty fighting infections and get certain abscesses more often.  SYMPTOMS Usually an abscess develops on the skin and becomes a painful mass that is red, warm, and tender. If the abscess forms under the skin, you may feel a moveable soft area under the skin. Some abscesses break open (rupture) on their own, but most will continue to get worse without care. The infection can spread deeper into the body and eventually into the bloodstream, causing you to feel ill.  DIAGNOSIS  Your caregiver will take your medical history and perform a physical exam. A sample of fluid may also be taken from the abscess to determine what is causing your infection. TREATMENT  Your caregiver may prescribe antibiotic medicines to fight the infection. However, taking antibiotics alone usually does not cure an abscess. Your caregiver may need to make a small cut (incision) in the abscess to drain the pus. In some cases, gauze is packed into the abscess to reduce pain and to continue draining the area. HOME CARE INSTRUCTIONS   Only take over-the-counter or prescription medicines for pain, discomfort, or fever as directed by your caregiver.  If you were prescribed antibiotics, take them as directed. Finish them even if you start to feel better.  If gauze is used, follow your caregiver's directions for changing the gauze.  To avoid spreading the infection:  Keep your draining abscess covered with a  bandage.  Wash your hands well.  Do not share personal care items, towels, or whirlpools with others.  Avoid skin contact with others.  Keep your skin and clothes clean around the abscess.  Keep all follow-up appointments as directed by your caregiver. SEEK MEDICAL CARE IF:   You have increased pain, swelling, redness, fluid drainage, or bleeding.  You have muscle aches, chills, or a general ill feeling.  You have a fever. MAKE SURE YOU:   Understand these instructions.  Will watch your condition.  Will get help right away if you are not doing well or get worse. Document Released: 07/09/2005 Document Revised: 03/30/2012 Document Reviewed: 12/12/2011 [employer] Patient Information 2014 [employer].  

## 2013-08-11 NOTE — Progress Notes (Signed)
I directly supervised and participated in the procedure and agree with the student's documentation.  

## 2013-08-11 NOTE — Progress Notes (Signed)
[facility] [address] [place] [address] [phone]  Date:  08/11/2013   Name:  [name] [name]   DOB:  Jan 23, 1966   MRN:  [id number]  PCP:  Pcp Not In System    Chief Complaint: Mass   History of Present Illness:  [name] [name] is a 47 y.o. very pleasant male patient who presents with the following:  Has a painful lesion on his intergluteal cleft just superior to anus.  No fever or chills.  Developed yesterday   Patient Active Problem List   Diagnosis Date Noted  . ALLERGIC RHINITIS 02/25/2008  . ASTHMA 02/25/2008  . MUSCULOSKELETAL PAIN 02/25/2008    Past Medical History  Diagnosis Date  . Allergy   . Asthma   . Heart murmur     Past Surgical History  Procedure Laterality Date  . Hernia repair      History  Substance Use Topics  . Smoking status: Never Smoker   . Smokeless tobacco: Not on file  . Alcohol Use: No    Family History  Problem Relation Age of Onset  . Diabetes Mother   . Cancer Mother     CHF  . Heart disease Mother     CHF  . Cancer Father     prostate  . Diabetes Father   . Kidney disease Father     CANCER  . Stroke Daughter   . Diabetes Maternal Grandmother   . Heart disease Maternal Grandfather     HEART ATTACK    No Known Allergies  Medication list has been reviewed and updated.  Current Outpatient Prescriptions on File Prior to Visit  Medication Sig Dispense Refill  . albuterol (PROVENTIL HFA;VENTOLIN HFA) 108 (90 BASE) MCG/ACT inhaler Inhale 2 puffs into the lungs every 6 (six) hours as needed for wheezing.  1 Inhaler  5  . Fluticasone-Salmeterol (ADVAIR) 100-50 MCG/DOSE AEPB Inhale 1 puff into the lungs 2 (two) times daily.  1 each  5  . azithromycin (ZITHROMAX) 250 MG tablet Take 2 tabs po now then 1 tab po daily  6 tablet  0  . HYDROcodone-acetaminophen (NORCO/VICODIN) 5-325 MG per tablet 1 OR 2 PO Q4H PRN PAIN  20 tablet  0  . ipratropium (ATROVENT) 0.03 % nasal spray Place 2 sprays into  the nose 2 (two) times daily as needed for rhinitis.      Marland Kitchen ipratropium (ATROVENT) 0.03 % nasal spray Place 2 sprays into the nose every 12 (twelve) hours.  30 mL  6  . MELOXICAM PO Take 1 tablet by mouth 2 (two) times daily as needed (rheumatoid arthritis).      . methocarbamol (ROBAXIN) 500 MG tablet Take 1 tablet (500 mg total) by mouth 3 (three) times daily.  21 tablet  0   No current facility-administered medications on file prior to visit.    Review of Systems:  As per HPI, otherwise negative.    Physical Examination: Filed Vitals:   08/11/13 1915  BP: 142/76  Pulse: 92  Temp: 99.3 F (37.4 C)  Resp: 18   Filed Vitals:   08/11/13 1915  Height: 5' 6.5" (1.689 m)  Weight: 174 lb 3.2 oz (79.017 kg)   Body mass index is 27.7 kg/(m^2). Ideal Body Weight: Weight in (lb) to have BMI = 25: 156.9   GEN: WDWN, NAD, Non-toxic, Alert & Oriented x 3 HEENT: Atraumatic, Normocephalic.  Ears and Nose: No external deformity. EXTR: No clubbing/cyanosis/edema NEURO: Normal gait.  PSYCH: Normally interactive. Conversant. Not depressed or anxious appearing.  Calm demeanor.  RECTAL:  Numerous skin tags.  Tender abscess superior to anus in intergluteal cleft  Assessment and Plan:  Abscess    Signed,  Phillips Odor, MD

## 2013-08-13 ENCOUNTER — Ambulatory Visit (INDEPENDENT_AMBULATORY_CARE_PROVIDER_SITE_OTHER): Payer: Managed Care, Other (non HMO) | Admitting: Family Medicine

## 2013-08-13 VITALS — BP 126/72 | HR 68 | Temp 98.0°F | Resp 16 | Ht 68.0 in | Wt 179.0 lb

## 2013-08-13 DIAGNOSIS — Z5189 Encounter for other specified aftercare: Secondary | ICD-10-CM

## 2013-08-13 NOTE — Progress Notes (Signed)
[facility] [address], [place] [address] [phone]  Date:  08/13/2013   Name:  [name] [name]   DOB:  1966-05-14   MRN:  [id number]  PCP:  Pcp Not In System    Chief Complaint: Cellulitis   History of Present Illness:  [name] [name] is a 47 y.o. very pleasant male patient who presents with the following:  Here today for a WC recheck.  He was here 2 days ago with a painful lesion in his intergluteal cleft.  He had an I and D and packing.   He has not noted any fever, and is tolerating the abx well.    Patient Active Problem List   Diagnosis Date Noted  . ALLERGIC RHINITIS 02/25/2008  . ASTHMA 02/25/2008  . MUSCULOSKELETAL PAIN 02/25/2008    Past Medical History  Diagnosis Date  . Allergy   . Asthma   . Heart murmur     Past Surgical History  Procedure Laterality Date  . Hernia repair      History  Substance Use Topics  . Smoking status: Never Smoker   . Smokeless tobacco: Not on file  . Alcohol Use: No    Family History  Problem Relation Age of Onset  . Diabetes Mother   . Cancer Mother     CHF  . Heart disease Mother     CHF  . Cancer Father     prostate  . Diabetes Father   . Kidney disease Father     CANCER  . Stroke Daughter   . Diabetes Maternal Grandmother   . Heart disease Maternal Grandfather     HEART ATTACK    No Known Allergies  Medication list has been reviewed and updated.  Current Outpatient Prescriptions on File Prior to Visit  Medication Sig Dispense Refill  . albuterol (PROVENTIL HFA;VENTOLIN HFA) 108 (90 BASE) MCG/ACT inhaler Inhale 2 puffs into the lungs every 6 (six) hours as needed for wheezing.  1 Inhaler  5  . azithromycin (ZITHROMAX) 250 MG tablet Take 2 tabs po now then 1 tab po daily  6 tablet  0  . Fluticasone-Salmeterol (ADVAIR) 100-50 MCG/DOSE AEPB Inhale 1 puff into the lungs 2 (two) times daily.  1 each  5  . HYDROcodone-acetaminophen (NORCO/VICODIN) 5-325 MG per tablet 1 OR 2 PO  Q4H PRN PAIN  20 tablet  0  . ipratropium (ATROVENT) 0.03 % nasal spray Place 2 sprays into the nose 2 (two) times daily as needed for rhinitis.      Marland Kitchen ipratropium (ATROVENT) 0.03 % nasal spray Place 2 sprays into the nose every 12 (twelve) hours.  30 mL  6  . MELOXICAM PO Take 1 tablet by mouth 2 (two) times daily as needed (rheumatoid arthritis).      . methocarbamol (ROBAXIN) 500 MG tablet Take 1 tablet (500 mg total) by mouth 3 (three) times daily.  21 tablet  0  . metroNIDAZOLE (FLAGYL) 500 MG tablet Take 1 tablet (500 mg total) by mouth 3 (three) times daily. DO NOT CONSUME ALCOHOL WHILE TAKING THIS MEDICATION.  30 tablet  0  . sulfamethoxazole-trimethoprim (BACTRIM DS,SEPTRA DS) 800-160 MG per tablet Take 1 tablet by mouth 2 (two) times daily.  20 tablet  0   No current facility-administered medications on file prior to visit.    Review of Systems:  As per HPI- otherwise negative.   Physical Examination: Filed Vitals:   08/13/13 1425  BP: 126/72  Pulse:  68  Temp: 98 F (36.7 C)  Resp: 16   Filed Vitals:   08/13/13 1425  Height: 5\' 8"  (1.727 m)  Weight: 179 lb (81.194 kg)   Body mass index is 27.22 kg/(m^2). Ideal Body Weight: Weight in (lb) to have BMI = 25: 164.1   GEN: WDWN, NAD, Non-toxic, Alert & Oriented x 3 HEENT: Atraumatic, Normocephalic.  Ears and Nose: No external deformity. EXTR: No clubbing/cyanosis/edema NEURO: Normal gait.  PSYCH: Normally interactive. Conversant. Not depressed or anxious appearing.  Calm demeanor.  Removed bandage from area of I and D.  Packing is no loner present- he reports that it came out this morning.  No discharge or purulence from the wound.  No redness or tenderness. Appears to be healing very well Applied a bandage but no packing  Assessment and Plan: Encounter for wound care  Removed bandage as above.  Did not repack.  Follow-up as needed.  Doing well  Signed Abbe Amsterdam, MD

## 2013-08-14 LAB — WOUND CULTURE
Gram Stain: NONE SEEN
Gram Stain: NONE SEEN

## 2013-08-18 ENCOUNTER — Other Ambulatory Visit: Payer: Self-pay

## 2013-09-28 NOTE — Telephone Encounter (Signed)
Error

## 2013-11-15 ENCOUNTER — Ambulatory Visit (INDEPENDENT_AMBULATORY_CARE_PROVIDER_SITE_OTHER): Payer: Managed Care, Other (non HMO) | Admitting: Emergency Medicine

## 2013-11-15 VITALS — BP 132/82 | HR 66 | Temp 98.4°F | Resp 17 | Ht 67.0 in | Wt 177.0 lb

## 2013-11-15 DIAGNOSIS — Z0000 Encounter for general adult medical examination without abnormal findings: Secondary | ICD-10-CM

## 2013-11-15 LAB — LIPID PANEL
CHOL/HDL RATIO: 3.7 ratio
CHOLESTEROL: 197 mg/dL (ref 0–200)
HDL: 53 mg/dL (ref >39)
LDL Cholesterol: 129 mg/dL — ABNORMAL HIGH (ref 0–99)
Triglycerides: 73 mg/dL (ref <150)
VLDL: 15 mg/dL (ref 0–40)

## 2013-11-15 LAB — COMPREHENSIVE METABOLIC PANEL WITH GFR
ALT: 25 U/L (ref 0–53)
AST: 11 U/L (ref 0–37)
Albumin: 4.7 g/dL (ref 3.5–5.2)
Alkaline Phosphatase: 68 U/L (ref 39–117)
BUN: 10 mg/dL (ref 6–23)
CO2: 28 meq/L (ref 19–32)
Calcium: 9.9 mg/dL (ref 8.4–10.5)
Chloride: 104 meq/L (ref 96–112)
Creat: 0.83 mg/dL (ref 0.50–1.35)
Glucose, Bld: 118 mg/dL — ABNORMAL HIGH (ref 70–99)
Potassium: 4.9 meq/L (ref 3.5–5.3)
Sodium: 140 meq/L (ref 135–145)
Total Bilirubin: 0.7 mg/dL (ref 0.2–1.2)
Total Protein: 7.2 g/dL (ref 6.0–8.3)

## 2013-11-15 LAB — POCT CBC
Granulocyte percent: 70.7 % (ref 37–80)
HCT, POC: 47.4 % (ref 43.5–53.7)
Hemoglobin: 15.1 g/dL (ref 14.1–18.1)
Lymph, poc: 1.4 (ref 0.6–3.4)
MCH, POC: 28.1 pg (ref 27–31.2)
MCHC: 31.9 g/dL (ref 31.8–35.4)
MCV: 88.3 fL (ref 80–97)
MID (cbc): 0.4 (ref 0–0.9)
MPV: 8 fL (ref 0–99.8)
POC Granulocyte: 4.3 (ref 2–6.9)
POC LYMPH PERCENT: 23.5 % (ref 10–50)
POC MID %: 5.8 % (ref 0–12)
Platelet Count, POC: 343 10*3/uL (ref 142–424)
RBC: 5.37 M/uL (ref 4.69–6.13)
RDW, POC: 14.6 %
WBC: 6.1 10*3/uL (ref 4.6–10.2)

## 2013-11-15 LAB — POCT URINALYSIS DIPSTICK
BILIRUBIN UA: NEGATIVE
Glucose, UA: NEGATIVE
KETONES UA: NEGATIVE
LEUKOCYTES UA: NEGATIVE
Nitrite, UA: NEGATIVE
PH UA: 5.5
Protein, UA: NEGATIVE
RBC UA: NEGATIVE
Spec Grav, UA: 1.015
Urobilinogen, UA: 0.2

## 2013-11-15 LAB — TSH: TSH: 1.031 u[IU]/mL (ref 0.350–4.500)

## 2013-11-15 LAB — PSA: PSA: 0.8 ng/mL (ref <=4.00)

## 2013-11-15 NOTE — Progress Notes (Signed)
[facility] [address], [place] [address] [phone]  Date:  11/15/2013   Name:  [name] [name]   DOB:  May 16, 1966   MRN:  [id number]  PCP:  Pcp Not In System    Chief Complaint: Annual Exam   History of Present Illness:  [name] [name] is a 48 y.o. very pleasant male patient who presents with the following:  For wellness examination. No current medical complaints.  History of season allergic rhinitis.  Some asthma symptoms. No current medications.  Non smoker.  Works as a Glass blower/designer.  No improvement with over the counter medications or other home remedies. Denies other complaint or health concern today.   Patient Active Problem List   Diagnosis Date Noted  . ALLERGIC RHINITIS 02/25/2008  . ASTHMA 02/25/2008  . MUSCULOSKELETAL PAIN 02/25/2008    Past Medical History  Diagnosis Date  . Allergy   . Asthma   . Heart murmur   . Arthritis     Past Surgical History  Procedure Laterality Date  . Hernia repair      History  Substance Use Topics  . Smoking status: Never Smoker   . Smokeless tobacco: Not on file  . Alcohol Use: No    Family History  Problem Relation Age of Onset  . Diabetes Mother   . Cancer Mother     CHF  . Heart disease Mother     CHF  . Cancer Father     prostate  . Diabetes Father   . Kidney disease Father     CANCER  . Stroke Daughter   . Diabetes Maternal Grandmother   . Heart disease Maternal Grandfather     HEART ATTACK    No Known Allergies  Medication list has been reviewed and updated.  Current Outpatient Prescriptions on File Prior to Visit  Medication Sig Dispense Refill  . albuterol (PROVENTIL HFA;VENTOLIN HFA) 108 (90 BASE) MCG/ACT inhaler Inhale 2 puffs into the lungs every 6 (six) hours as needed for wheezing.  1 Inhaler  5  . Fluticasone-Salmeterol (ADVAIR) 100-50 MCG/DOSE AEPB Inhale 1 puff into the lungs 2 (two) times daily.  1 each  5  . ipratropium (ATROVENT) 0.03 % nasal spray Place 2  sprays into the nose 2 (two) times daily as needed for rhinitis.      Marland Kitchen ipratropium (ATROVENT) 0.03 % nasal spray Place 2 sprays into the nose every 12 (twelve) hours.  30 mL  6   No current facility-administered medications on file prior to visit.    Review of Systems:  As per HPI, otherwise negative.    Physical Examination: Filed Vitals:   11/15/13 0843  BP: 132/82  Pulse: 66  Temp: 98.4 F (36.9 C)  Resp: 17   Filed Vitals:   11/15/13 0843  Height: 5\' 7"  (1.702 m)  Weight: 177 lb (80.287 kg)   Body mass index is 27.72 kg/(m^2). Ideal Body Weight: Weight in (lb) to have BMI = 25: 159.3  GEN: WDWN, NAD, Non-toxic, A & O x 3 HEENT: Atraumatic, Normocephalic. Neck supple. No masses, No LAD. Ears and Nose: No external deformity. CV: RRR, No M/G/R. No JVD. No thrill. No extra heart sounds. PULM: CTA B, no wheezes, crackles, rhonchi. No retractions. No resp. distress. No accessory muscle use. ABD: S, NT, ND, +BS. No rebound. No HSM. EXTR: No c/c/e NEURO Normal gait.  PSYCH: Normally interactive. Conversant. Not depressed or anxious appearing.  Calm demeanor.  DRE: nomral  Assessment and Plan: Labs Follow up based on labs   Signed,  Ellison Carwin, MD

## 2013-11-17 ENCOUNTER — Other Ambulatory Visit: Payer: Self-pay

## 2013-11-17 DIAGNOSIS — R739 Hyperglycemia, unspecified: Secondary | ICD-10-CM

## 2013-11-18 ENCOUNTER — Other Ambulatory Visit (INDEPENDENT_AMBULATORY_CARE_PROVIDER_SITE_OTHER): Payer: Managed Care, Other (non HMO)

## 2013-11-18 DIAGNOSIS — R7309 Other abnormal glucose: Secondary | ICD-10-CM

## 2013-11-18 DIAGNOSIS — R739 Hyperglycemia, unspecified: Secondary | ICD-10-CM

## 2013-11-18 LAB — POCT GLYCOSYLATED HEMOGLOBIN (HGB A1C): Hemoglobin A1C: 6.1

## 2013-11-22 ENCOUNTER — Telehealth: Payer: Self-pay

## 2013-11-22 NOTE — Telephone Encounter (Signed)
Pt called saying that he had gotten a call about his lab. He had already gotten results of those done on 11/15/13, but came back for A1C. I do not see notes from Dr review but did give him the result and advised that if Dr Ouida Sills has any concerns or instr's regarding result that we will call him back with that. Otherwise, we will keep a check on his glucose at reg checkups. Dr Ouida Sills, please advise if we need to contact pt back.

## 2014-01-10 ENCOUNTER — Ambulatory Visit (INDEPENDENT_AMBULATORY_CARE_PROVIDER_SITE_OTHER): Payer: Managed Care, Other (non HMO) | Admitting: Family Medicine

## 2014-01-10 VITALS — BP 130/66 | HR 73 | Temp 97.9°F | Resp 18 | Ht 67.0 in | Wt 180.2 lb

## 2014-01-10 DIAGNOSIS — J45909 Unspecified asthma, uncomplicated: Secondary | ICD-10-CM

## 2014-01-10 DIAGNOSIS — J069 Acute upper respiratory infection, unspecified: Secondary | ICD-10-CM

## 2014-01-10 MED ORDER — HYDROCOD POLST-CHLORPHEN POLST 10-8 MG/5ML PO LQCR: 5.0000 mL | Freq: Two times a day (BID) | ORAL | Status: DC | PRN

## 2014-01-10 MED ORDER — CEFDINIR 300 MG PO CAPS: 600.0000 mg | ORAL_CAPSULE | Freq: Every day | ORAL | Status: DC

## 2014-01-10 NOTE — Progress Notes (Signed)
   Subjective:    Patient ID: [name] [name], male    DOB: 1966-03-03, 48 y.o.   MRN: [id number]  HPI Patient present today with one week history of productive cough, bloody nasal drainage. Cough became progressively worse with coughing all through the night last night. Took some Mucinex with loosening of chest secretions. Has taken alka seltzer with minimal relief.  Patient with history of asthma. Has Advair and albuterol inhalers, but does not regularly use. Has allergic rhinitis in the spring and fall, has used atrovent nasal spray in the past, has not been using recently.   Review of Systems Right ear pressure, sinus headaches above eyes, no sore throat, + post nasal drainage, some wheezing, chest feels tight with deep breath. No SOB.    Objective:   Physical Exam  Vitals reviewed. Constitutional: He is oriented to person, place, and time. He appears well-developed and well-nourished.  HENT:  Head: Normocephalic and atraumatic.  Right Ear: Tympanic membrane, external ear and ear canal normal.  Left Ear: Tympanic membrane, external ear and ear canal normal.  Nose: Mucosal edema and rhinorrhea present. Right sinus exhibits no maxillary sinus tenderness and no frontal sinus tenderness. Left sinus exhibits no maxillary sinus tenderness and no frontal sinus tenderness.  Mouth/Throat: Oropharyngeal exudate present. No posterior oropharyngeal edema, posterior oropharyngeal erythema or tonsillar abscesses.  Eyes: Conjunctivae are normal. Right eye exhibits no discharge. Left eye exhibits no discharge.  Neck: Normal range of motion. Neck supple.  Cardiovascular: Normal rate, regular rhythm and normal heart sounds.   Pulmonary/Chest: Effort normal and breath sounds normal.  Musculoskeletal: Normal range of motion.  Lymphadenopathy:    He has no cervical adenopathy.  Neurological: He is alert and oriented to person, place, and time.  Skin: Skin is warm and dry.  Psychiatric: He has a normal mood  and affect. His behavior is normal. Judgment and thought content normal.       Assessment & Plan:  1. Upper respiratory infection - cefdinir (OMNICEF) 300 MG capsule; Take 2 capsules (600 mg total) by mouth daily.  Dispense: 20 capsule; Refill: 0 - chlorpheniramine-HYDROcodone (TUSSIONEX PENNKINETIC ER) 10-8 MG/5ML LQCR; Take 5 mLs by mouth every 12 (twelve) hours as needed for cough (cough).  Dispense: 70 mL; Refill: 0  2. Asthma -Discussed with patient. Instructions provided regarding medication and need to use Advair daily. Patient reports that he has been told this before.    Elby Beck, FNP-BC  [facility]  01/10/2014 12:01 PM   Discussed with Ms. Carlean Purl, NP and agree with above.  Christell Faith, MHS, PA-C [facility] [place], [place] [address] [place] [facility] 01/13/2014 7:27 PM

## 2014-01-10 NOTE — Patient Instructions (Addendum)
Use Advair every day Take antibiotic as prescribed Drink 8-10 glasses fluid a day Follow up if no improvement in 5-7 days Continue Mucinex, can add Afrin for up to 3 days.

## 2014-03-27 ENCOUNTER — Other Ambulatory Visit: Payer: Self-pay | Admitting: Occupational Medicine

## 2014-03-27 ENCOUNTER — Ambulatory Visit: Payer: Self-pay

## 2014-03-27 DIAGNOSIS — M79642 Pain in left hand: Secondary | ICD-10-CM

## 2014-05-11 ENCOUNTER — Other Ambulatory Visit: Payer: Self-pay | Admitting: Family Medicine

## 2014-05-12 ENCOUNTER — Other Ambulatory Visit: Payer: Self-pay | Admitting: Family Medicine

## 2014-05-18 ENCOUNTER — Ambulatory Visit (INDEPENDENT_AMBULATORY_CARE_PROVIDER_SITE_OTHER): Payer: Managed Care, Other (non HMO) | Admitting: Family Medicine

## 2014-05-18 ENCOUNTER — Ambulatory Visit (INDEPENDENT_AMBULATORY_CARE_PROVIDER_SITE_OTHER): Payer: Managed Care, Other (non HMO)

## 2014-05-18 VITALS — BP 126/78 | HR 63 | Temp 97.7°F | Resp 17 | Ht 67.0 in | Wt 178.0 lb

## 2014-05-18 DIAGNOSIS — S139XXA Sprain of joints and ligaments of unspecified parts of neck, initial encounter: Secondary | ICD-10-CM

## 2014-05-18 DIAGNOSIS — S161XXA Strain of muscle, fascia and tendon at neck level, initial encounter: Secondary | ICD-10-CM

## 2014-05-18 DIAGNOSIS — M542 Cervicalgia: Secondary | ICD-10-CM

## 2014-05-18 MED ORDER — PREDNISONE 20 MG PO TABS: ORAL_TABLET | ORAL | Status: DC

## 2014-05-18 MED ORDER — METHOCARBAMOL 500 MG PO TABS: 500.0000 mg | ORAL_TABLET | Freq: Four times a day (QID) | ORAL | Status: DC

## 2014-05-18 MED ORDER — HYDROCODONE-ACETAMINOPHEN 5-325 MG PO TABS: 1.0000 | ORAL_TABLET | Freq: Four times a day (QID) | ORAL | Status: DC | PRN

## 2014-05-18 NOTE — Progress Notes (Addendum)
Subjective:   This chart was scribed for Wardell Honour, MD by Forrestine Him, [facility] Scribe. This patient was seen in room 1 and the patient's care was started 8:50 PM.    Patient ID: [name] [name], male    DOB: [birth year], 48 y.o.   MRN: [id number]  05/18/2014  Chest Pain, Shortness of Breath and Arm Pain   HPI  HPI Comments: [name] [name] is a 48 y.o. male who [facility] complaining of constant, moderate L shoulder pain x 3 days that is unchanged. He also reports neck pain and L arm pain. He describes this pain as burning and tight. Pt states he noted the pain when he was cleaning outside. Pain is exacerbated with certain movements and palpation which causes shooting pain down the L arm. Discomfort is mildly alleviated with abduction of the arms bilaterally. He has tried OTC Tylenol and Ibuprofen without any improvements for symptoms. He has also tried his wife's prescribed Vicodin and muscle relaxer with no relief. He denies any chest pain, fever, or chills. 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 No known allergies to medications. No other concerns this visit.  Pt concerned with cardiac etiology to symptoms. Leaving for the beach tomorrow.   Review of Systems  Constitutional: Negative for fever and chills.  Respiratory: Negative for shortness of breath.   Cardiovascular: Negative for chest pain, palpitations and leg swelling.  Gastrointestinal: Negative for nausea.  Musculoskeletal: Positive for arthralgias (L shoulder and arm) and neck pain.  Skin: Negative for rash.  Neurological: Negative for weakness, numbness and headaches.  Psychiatric/Behavioral: Negative for confusion.    Past Medical History  Diagnosis Date  . Allergy   . Asthma   . Heart murmur   . Arthritis    Past Surgical History  Procedure Laterality Date  . Hernia repair     No Known Allergies Current Outpatient  Prescriptions  Medication Sig Dispense Refill  . ADVAIR DISKUS 100-50 MCG/DOSE AEPB INHALE 1 PUFF INTO THE LUNGS TWICE DAILY  60 each  1  . ipratropium (ATROVENT) 0.03 % nasal spray Place 2 sprays into the nose 2 (two) times daily as needed for rhinitis.      Marland Kitchen ipratropium (ATROVENT) 0.03 % nasal spray Place 2 sprays into the nose every 12 (twelve) hours.  30 mL  6  . PROVENTIL HFA 108 (90 BASE) MCG/ACT inhaler INHALE 2 PUFFS BY MOUTH INTO THE LUNGS EVERY 6 HOURS AS NEEDED FOR WHEEZING  6.7 g  1  . HYDROcodone-acetaminophen (NORCO/VICODIN) 5-325 MG per tablet Take 1 tablet by mouth every 6 (six) hours as needed for moderate pain.  30 tablet  0  . methocarbamol (ROBAXIN) 500 MG tablet Take 1 tablet (500 mg total) by mouth 4 (four) times daily.  40 tablet  0  . predniSONE (DELTASONE) 20 MG tablet Three tablets daily x 2 days then two tablets daily x 5 days ten one tablet daily x 4 days  20 tablet  0   No current facility-administered medications for this visit.   History   Social History  . Marital Status: Married    Spouse Name: N/A    Number of Children: N/A  . Years of Education: N/A   Occupational History  . Not on file.   Social History Main Topics  . Smoking status: Never Smoker   . Smokeless tobacco: Not on file  . Alcohol  Use: No  . Drug Use: No  . Sexual Activity: Yes   Other Topics Concern  . Not on file   Social History Narrative  . No narrative on file   Family History  Problem Relation Age of Onset  . Diabetes Mother   . Cancer Mother     CHF  . Heart disease Mother     CHF  . Cancer Father     prostate  . Diabetes Father   . Kidney disease Father     CANCER  . Stroke Daughter   . Diabetes Maternal Grandmother   . Heart disease Maternal Grandfather     HEART ATTACK       Objective:    BP 126/78  Pulse 63  Temp(Src) 97.7 F (36.5 C) (Oral)  Resp 17  Ht 5\' 7"  (1.702 m)  Wt 178 lb (80.74 kg)  BMI 27.87 kg/m2  SpO2 98% Physical Exam  Nursing  note and vitals reviewed. Constitutional: He is oriented to person, place, and time. He appears well-developed and well-nourished. No distress.  HENT:  Head: Normocephalic and atraumatic.  Mouth/Throat: Oropharynx is clear and moist.  Eyes: Conjunctivae and EOM are normal. Pupils are equal, round, and reactive to light.  Neck: Trachea normal. Neck supple. Muscular tenderness present. No spinous process tenderness present. Carotid bruit is not present. No rigidity. Decreased range of motion present. No edema and no erythema present. No thyromegaly present.  Cardiovascular: Normal rate, regular rhythm, normal heart sounds and intact distal pulses.  Exam reveals no gallop and no friction rub.   No murmur heard. Pulmonary/Chest: Effort normal and breath sounds normal. He has no wheezes. He has no rales.  Abdominal: He exhibits no distension.  Musculoskeletal: He exhibits tenderness.       Right shoulder: Normal. He exhibits normal range of motion, no tenderness, no bony tenderness, no spasm, normal pulse and normal strength.       Left shoulder: He exhibits normal range of motion, no tenderness, no bony tenderness, no swelling, no pain, no spasm, normal pulse and normal strength.       Cervical back: He exhibits decreased range of motion, tenderness, pain and spasm. He exhibits no bony tenderness.  Cervical spine: painful ROM in all directions; +TTP L trapezius region and along occipital ridge on L.    Lymphadenopathy:    He has no cervical adenopathy.  Neurological: He is alert and oriented to person, place, and time. No cranial nerve deficit.  Skin: Skin is warm and dry. No rash noted. He is not diaphoretic.  Psychiatric: He has a normal mood and affect. His behavior is normal.    [facility] reading (PRIMARY) by  Dr. Tamala Julian. CERVICAL SPINE:  NAD; MILD SPURRING; ?COMPRESSION OF C5.      Assessment & Plan:   1. Neck pain   2. Neck strain, initial encounter    1. Neck pain/strain:  New.  Rx for  Prednisone, Robaxin, Hydrocodone provided; home exercise program reviewed and provided. Recommend heat to the area bid for 15-20 minutes each time.  Call if no improvement in 2 weeks for ortho referral.  Meds ordered this encounter  Medications  . predniSONE (DELTASONE) 20 MG tablet    Sig: Three tablets daily x 2 days then two tablets daily x 5 days ten one tablet daily x 4 days    Dispense:  20 tablet    Refill:  0  . methocarbamol (ROBAXIN) 500 MG tablet    Sig: Take 1  tablet (500 mg total) by mouth 4 (four) times daily.    Dispense:  40 tablet    Refill:  0  . HYDROcodone-acetaminophen (NORCO/VICODIN) 5-325 MG per tablet    Sig: Take 1 tablet by mouth every 6 (six) hours as needed for moderate pain.    Dispense:  30 tablet    Refill:  0    No Follow-up on file.    I personally performed the services described in this documentation, which was scribed in my presence. The recorded information has been reviewed and is accurate.   Reginia Forts, M.D.  [facility] [address] [place], [place]  [phone] phone [phone] fax

## 2014-07-24 ENCOUNTER — Ambulatory Visit (INDEPENDENT_AMBULATORY_CARE_PROVIDER_SITE_OTHER): Payer: Managed Care, Other (non HMO)

## 2014-07-24 ENCOUNTER — Ambulatory Visit (INDEPENDENT_AMBULATORY_CARE_PROVIDER_SITE_OTHER): Payer: Managed Care, Other (non HMO) | Admitting: Internal Medicine

## 2014-07-24 VITALS — BP 140/82 | HR 64 | Temp 98.1°F | Resp 18 | Ht 67.0 in | Wt 176.4 lb

## 2014-07-24 DIAGNOSIS — R0781 Pleurodynia: Secondary | ICD-10-CM

## 2014-07-24 DIAGNOSIS — R072 Precordial pain: Secondary | ICD-10-CM

## 2014-07-24 DIAGNOSIS — Z8679 Personal history of other diseases of the circulatory system: Secondary | ICD-10-CM

## 2014-07-24 DIAGNOSIS — R062 Wheezing: Secondary | ICD-10-CM

## 2014-07-24 LAB — POCT CBC
Granulocyte percent: 68 %G (ref 37–80)
HEMATOCRIT: 44.2 % (ref 43.5–53.7)
Hemoglobin: 14.2 g/dL (ref 14.1–18.1)
LYMPH, POC: 2.3 (ref 0.6–3.4)
MCH: 27.4 pg (ref 27–31.2)
MCHC: 32.1 g/dL (ref 31.8–35.4)
MCV: 85.6 fL (ref 80–97)
MID (cbc): 0.9 (ref 0–0.9)
MPV: 6.7 fL (ref 0–99.8)
POC GRANULOCYTE: 6.8 (ref 2–6.9)
POC LYMPH %: 23.1 % (ref 10–50)
POC MID %: 8.9 %M (ref 0–12)
Platelet Count, POC: 416 10*3/uL (ref 142–424)
RBC: 5.16 M/uL (ref 4.69–6.13)
RDW, POC: 13.5 %
WBC: 10 10*3/uL (ref 4.6–10.2)

## 2014-07-24 MED ORDER — AZITHROMYCIN 500 MG PO TABS: 500.0000 mg | ORAL_TABLET | Freq: Every day | ORAL | Status: DC

## 2014-07-24 MED ORDER — PREDNISONE 10 MG PO TABS: ORAL_TABLET | ORAL | Status: DC

## 2014-07-24 MED ORDER — HYDROCODONE-ACETAMINOPHEN 7.5-325 MG/15ML PO SOLN: 10.0000 mL | Freq: Four times a day (QID) | ORAL | Status: DC | PRN

## 2014-07-24 MED ORDER — FLUTICASONE-SALMETEROL 100-50 MCG/DOSE IN AEPB: 1.0000 | INHALATION_SPRAY | Freq: Two times a day (BID) | RESPIRATORY_TRACT | Status: DC

## 2014-07-24 MED ORDER — ALBUTEROL SULFATE HFA 108 (90 BASE) MCG/ACT IN AERS: 2.0000 | INHALATION_SPRAY | RESPIRATORY_TRACT | Status: DC | PRN

## 2014-07-24 NOTE — Progress Notes (Signed)
Subjective:    Patient ID: [name] [name], male    DOB: 1965/10/22, 48 y.o.   MRN: [id number]  HPI Pt is here with 2 week history of SOB especially with exertion and while trying to sleep. He states he has noticed a "crackling" noise when trying to sleep. His wife noted some wheezing while sleeping as well. He reports having to use the Albuterol and Advair for the past 2 weeks with no relief. He states he does have a history of asthma but says it is typically exacerbated during allergy, but he is usually able to control it. He wants 90day refills of asthma inhalers to new pharmacy.  He also notes some associated chest pressure and tightness on the right side intermittently with the SOB. He has noted this chest pain when trying to sleep as well. Patient states he has been unable to sleep due to this pain and SOB. He states he has been taking ibuprofen for the chest pain with little to no relief.  Patient denies respiratory symptoms. He denies fever.  Has past hx of pericarditis, nonsmoker. Minimal cough, past hx also of pneumonia. Phx of fx 5 ribs and clavicle Review of Systems     Objective:   Physical Exam  Constitutional: He is oriented to person, place, and time. He appears well-developed and well-nourished.  HENT:  Head: Normocephalic.  Right Ear: External ear normal.  Left Ear: External ear normal.  Nose: Nose normal.  Mouth/Throat: Oropharynx is clear and moist.  Eyes: Conjunctivae and EOM are normal. Pupils are equal, round, and reactive to light. No scleral icterus.  Neck: Normal range of motion. Neck supple.  Cardiovascular: Normal rate, regular rhythm and normal heart sounds.  Exam reveals no gallop and no friction rub.   No murmur heard. Pulmonary/Chest: Effort normal. No accessory muscle usage. Not tachypneic. No respiratory distress. He has no decreased breath sounds. He has wheezes. He has rhonchi. He exhibits tenderness.  Abdominal: Soft. He exhibits no mass. There is no  tenderness.  Musculoskeletal: Normal range of motion. He exhibits no tenderness.  Lymphadenopathy:    He has no cervical adenopathy.    He has no axillary adenopathy.       Right: No inguinal adenopathy present.  Neurological: He is alert and oriented to person, place, and time. He exhibits normal muscle tone. Coordination normal.  Skin: No rash noted. He is diaphoretic.  Psychiatric: He has a normal mood and affect. His behavior is normal.   EKG is normal   [facility] reading (PRIMARY) by  Dr.Dakiyah Heinke cxr infiltate possibly RML, possible fluid culdesac on lateral Results for orders placed in visit on 07/24/14  POCT CBC      Result Value Ref Range   WBC 10.0  4.6 - 10.2 K/uL   Lymph, poc 2.3  0.6 - 3.4   POC LYMPH PERCENT 23.1  10 - 50 %L   MID (cbc) 0.9  0 - 0.9   POC MID % 8.9  0 - 12 %M   POC Granulocyte 6.8  2 - 6.9   Granulocyte percent 68.0  37 - 80 %G   RBC 5.16  4.69 - 6.13 M/uL   Hemoglobin 14.2  14.1 - 18.1 g/dL   HCT, POC 44.2  43.5 - 53.7 %   MCV 85.6  80 - 97 fL   MCH, POC 27.4  27 - 31.2 pg   MCHC 32.1  31.8 - 35.4 g/dL   RDW, POC 13.5  Platelet Count, POC 416  142 - 424 K/uL   MPV 6.7  0 - 99.8 fL        Assessment & Plan:  Chest pain pleuritic Abnormal cxr radiology report normal Asthma/Pleurisy Prednisone/zithromax 500mg /lortab for pain or cough RF meds 1 year

## 2014-07-24 NOTE — Patient Instructions (Signed)
Pleurisy Pleurisy is an inflammation and swelling of the lining of the lungs (pleura). Because of this inflammation, it hurts to breathe. It can be aggravated by coughing, laughing, or deep breathing. Pleurisy is often caused by an underlying infection or disease.  HOME CARE INSTRUCTIONS  Monitor your pleurisy for any changes. The following actions may help to alleviate any discomfort you are experiencing:  Medicine may help with pain. Only take over-the-counter or prescription medicines for pain, discomfort, or fever as directed by your health care provider.  Only take antibiotic medicine as directed. Make sure to finish it even if you start to feel better. SEEK MEDICAL CARE IF:   Your pain is not controlled with medicine or is increasing.  You have an increase in pus-like (purulent) secretions brought up with coughing. SEEK IMMEDIATE MEDICAL CARE IF:   You have blue or dark lips, fingernails, or toenails.  You are coughing up blood.  You have increased difficulty breathing.  You have continuing pain unrelieved by medicine or pain lasting more than 1 week.  You have pain that radiates into your neck, arms, or jaw.  You develop increased shortness of breath or wheezing.  You develop a fever, rash, vomiting, fainting, or other serious symptoms. MAKE SURE YOU:  Understand these instructions.   Will watch your condition.   Will get help right away if you are not doing well or get worse.  Document Released: 09/29/2005 Document Revised: 06/01/2013 Document Reviewed: 03/13/2013 [facility] Patient Information 2015 [place], [place]. This information is not intended to replace advice given to you by your health care provider. Make sure you discuss any questions you have with your health care provider. Asthma Attack Prevention Although there is no way to prevent asthma from starting, you can take steps to control the disease and reduce its symptoms. Learn about your asthma and how to  control it. Take an active role to control your asthma by working with your health care provider to create and follow an asthma action plan. An asthma action plan guides you in:  Taking your medicines properly.  Avoiding things that set off your asthma or make your asthma worse (asthma triggers).  Tracking your level of asthma control.  Responding to worsening asthma.  Seeking emergency care when needed. To track your asthma, keep records of your symptoms, check your peak flow number using a handheld device that shows how well air moves out of your lungs (peak flow meter), and get regular asthma checkups.  WHAT ARE SOME WAYS TO PREVENT AN ASTHMA ATTACK?  Take medicines as directed by your health care provider.  Keep track of your asthma symptoms and level of control.  With your health care provider, write a detailed plan for taking medicines and managing an asthma attack. Then be sure to follow your action plan. Asthma is an ongoing condition that needs regular monitoring and treatment.  Identify and avoid asthma triggers. Many outdoor allergens and irritants (such as pollen, mold, cold air, and air pollution) can trigger asthma attacks. Find out what your asthma triggers are and take steps to avoid them.  Monitor your breathing. Learn to recognize warning signs of an attack, such as coughing, wheezing, or shortness of breath. Your lung function may decrease before you notice any signs or symptoms, so regularly measure and record your peak airflow with a home peak flow meter.  Identify and treat attacks early. If you act quickly, you are less likely to have a severe attack. You will also need  less medicine to control your symptoms. When your peak flow measurements decrease and alert you to an upcoming attack, take your medicine as instructed and immediately stop any activity that may have triggered the attack. If your symptoms do not improve, get medical help.  Pay attention to increasing  quick-relief inhaler use. If you find yourself relying on your quick-relief inhaler, your asthma is not under control. See your health care provider about adjusting your treatment. WHAT CAN MAKE MY SYMPTOMS WORSE? A number of common things can set off or make your asthma symptoms worse and cause temporary increased inflammation of your airways. Keep track of your asthma symptoms for several weeks, detailing all the environmental and emotional factors that are linked with your asthma. When you have an asthma attack, go back to your asthma diary to see which factor, or combination of factors, might have contributed to it. Once you know what these factors are, you can take steps to control many of them. If you have allergies and asthma, it is important to take asthma prevention steps at home. Minimizing contact with the substance to which you are allergic will help prevent an asthma attack. Some triggers and ways to avoid these triggers are: Animal Dander:  Some people are allergic to the flakes of skin or dried saliva from animals with fur or feathers.   There is no such thing as a hypoallergenic dog or cat breed. All dogs or cats can cause allergies, even if they don't shed.  Keep these pets out of your home.  If you are not able to keep a pet outdoors, keep the pet out of your bedroom and other sleeping areas at all times, and keep the door closed.  Remove carpets and furniture covered with cloth from your home. If that is not possible, keep the pet away from fabric-covered furniture and carpets. Dust Mites: Many people with asthma are allergic to dust mites. Dust mites are tiny bugs that are found in every home in mattresses, pillows, carpets, fabric-covered furniture, bedcovers, clothes, stuffed toys, and other fabric-covered items.   Cover your mattress in a special dust-proof cover.  Cover your pillow in a special dust-proof cover, or wash the pillow each week in hot water. Water must be hotter  than 130 F (54.4 C) to kill dust mites. Cold or warm water used with detergent and bleach can also be effective.  Wash the sheets and blankets on your bed each week in hot water.  Try not to sleep or lie on cloth-covered cushions.  Call ahead when traveling and ask for a smoke-free hotel room. Bring your own bedding and pillows in case the hotel only supplies feather pillows and down comforters, which may contain dust mites and cause asthma symptoms.  Remove carpets from your bedroom and those laid on concrete, if you can.  Keep stuffed toys out of the bed, or wash the toys weekly in hot water or cooler water with detergent and bleach. Cockroaches: Many people with asthma are allergic to the droppings and remains of cockroaches.   Keep food and garbage in closed containers. Never leave food out.  Use poison baits, traps, powders, gels, or paste (for example, boric acid).  If a spray is used to kill cockroaches, stay out of the room until the odor goes away. Indoor Mold:  Fix leaky faucets, pipes, or other sources of water that have mold around them.  Clean floors and moldy surfaces with a fungicide or diluted bleach.  Avoid using  humidifiers, vaporizers, or swamp coolers. These can spread molds through the air. Pollen and Outdoor Mold:  When pollen or mold spore counts are high, try to keep your windows closed.  Stay indoors with windows closed from late morning to afternoon. Pollen and some mold spore counts are highest at that time.  Ask your health care provider whether you need to take anti-inflammatory medicine or increase your dose of the medicine before your allergy season starts. Other Irritants to Avoid:  Tobacco smoke is an irritant. If you smoke, ask your health care provider how you can quit. Ask family members to quit smoking, too. Do not allow smoking in your home or car.  If possible, do not use a wood-burning stove, kerosene heater, or fireplace. Minimize  exposure to all sources of smoke, including incense, candles, fires, and fireworks.  Try to stay away from strong odors and sprays, such as perfume, talcum powder, hair spray, and paints.  Decrease humidity in your home and use an indoor air cleaning device. Reduce indoor humidity to below 60%. Dehumidifiers or central air conditioners can do this.  Decrease house dust exposure by changing furnace and air cooler filters frequently.  Try to have someone else vacuum for you once or twice a week. Stay out of rooms while they are being vacuumed and for a short while afterward.  If you vacuum, use a dust mask from a hardware store, a double-layered or microfilter vacuum cleaner bag, or a vacuum cleaner with a HEPA filter.  Sulfites in foods and beverages can be irritants. Do not drink beer or wine or eat dried fruit, processed potatoes, or shrimp if they cause asthma symptoms.  Cold air can trigger an asthma attack. Cover your nose and mouth with a scarf on cold or windy days.  Several health conditions can make asthma more difficult to manage, including a runny nose, sinus infections, reflux disease, psychological stress, and sleep apnea. Work with your health care provider to manage these conditions.  Avoid close contact with people who have a respiratory infection such as a cold or the flu, since your asthma symptoms may get worse if you catch the infection. Wash your hands thoroughly after touching items that may have been handled by people with a respiratory infection.  Get a flu shot every year to protect against the flu virus, which often makes asthma worse for days or weeks. Also get a pneumonia shot if you have not previously had one. Unlike the flu shot, the pneumonia shot does not need to be given yearly. Medicines:  Talk to your health care provider about whether it is safe for you to take aspirin or non-steroidal anti-inflammatory medicines (NSAIDs). In a small number of people with  asthma, aspirin and NSAIDs can cause asthma attacks. These medicines must be avoided by people who have known aspirin-sensitive asthma. It is important that people with aspirin-sensitive asthma read labels of all over-the-counter medicines used to treat pain, colds, coughs, and fever.  Beta-blockers and ACE inhibitors are other medicines you should discuss with your health care provider. HOW CAN I FIND OUT WHAT I AM ALLERGIC TO? Ask your asthma health care provider about allergy skin testing or blood testing (the RAST test) to identify the allergens to which you are sensitive. If you are found to have allergies, the most important thing to do is to try to avoid exposure to any allergens that you are sensitive to as much as possible. Other treatments for allergies, such as medicines and  allergy shots (immunotherapy) are available.  CAN I EXERCISE? Follow your health care provider's advice regarding asthma treatment before exercising. It is important to maintain a regular exercise program, but vigorous exercise or exercise in cold, humid, or dry environments can cause asthma attacks, especially for those people who have exercise-induced asthma. Document Released: 09/17/2009 Document Revised: 10/04/2013 Document Reviewed: 04/06/2013 [facility] Patient Information 2015 [place], [place]. This information is not intended to replace advice given to you by your health care provider. Make sure you discuss any questions you have with your health care provider.

## 2014-07-27 ENCOUNTER — Telehealth: Payer: Self-pay

## 2014-07-27 NOTE — Telephone Encounter (Signed)
Pt's pharmacy called to let Dr. Gwyndolyn Kaufman that rx on 10/12 is not covered by insurance, but proair is

## 2014-07-28 NOTE — Telephone Encounter (Signed)
FYI- Notified pharmacy ok to switch to Proair- these are the same medications.

## 2014-07-29 NOTE — Telephone Encounter (Signed)
Substitute as needed

## 2014-09-09 ENCOUNTER — Ambulatory Visit (INDEPENDENT_AMBULATORY_CARE_PROVIDER_SITE_OTHER): Payer: Managed Care, Other (non HMO) | Admitting: Emergency Medicine

## 2014-09-09 ENCOUNTER — Ambulatory Visit (INDEPENDENT_AMBULATORY_CARE_PROVIDER_SITE_OTHER): Payer: Managed Care, Other (non HMO)

## 2014-09-09 VITALS — BP 124/78 | HR 87 | Temp 98.2°F | Resp 28 | Ht 67.0 in | Wt 180.2 lb

## 2014-09-09 DIAGNOSIS — R0609 Other forms of dyspnea: Secondary | ICD-10-CM

## 2014-09-09 DIAGNOSIS — R938 Abnormal findings on diagnostic imaging of other specified body structures: Secondary | ICD-10-CM

## 2014-09-09 DIAGNOSIS — R9389 Abnormal findings on diagnostic imaging of other specified body structures: Secondary | ICD-10-CM

## 2014-09-09 DIAGNOSIS — R942 Abnormal results of pulmonary function studies: Secondary | ICD-10-CM

## 2014-09-09 LAB — POCT CBC
GRANULOCYTE PERCENT: 73.9 % (ref 37–80)
HCT, POC: 47.1 % (ref 43.5–53.7)
HEMOGLOBIN: 15.3 g/dL (ref 14.1–18.1)
Lymph, poc: 2 (ref 0.6–3.4)
MCH, POC: 27.5 pg (ref 27–31.2)
MCHC: 32.6 g/dL (ref 31.8–35.4)
MCV: 84.3 fL (ref 80–97)
MID (CBC): 0.9 (ref 0–0.9)
MPV: 6.6 fL (ref 0–99.8)
PLATELET COUNT, POC: 411 10*3/uL (ref 142–424)
POC GRANULOCYTE: 8.3 — AB (ref 2–6.9)
POC LYMPH PERCENT: 18 %L (ref 10–50)
POC MID %: 8.1 % (ref 0–12)
RBC: 5.59 M/uL (ref 4.69–6.13)
RDW, POC: 13.7 %
WBC: 11.2 10*3/uL — AB (ref 4.6–10.2)

## 2014-09-09 LAB — POCT SEDIMENTATION RATE: POCT SED RATE: 34 mm/h — AB (ref 0–22)

## 2014-09-09 NOTE — Progress Notes (Signed)
Subjective:    Patient ID: [name] [name], male    DOB: [birth year], 48 y.o.   MRN: [id number]  Pcp Not In System  Chief Complaint  Patient presents with  . Wheezing  . Fatigue  . Shortness of Breath  . Pain in Ribs   Patient Active Problem List   Diagnosis Date Noted  . Hx of viral pericarditis 07/24/2014  . ALLERGIC RHINITIS 02/25/2008  . ASTHMA 02/25/2008  . MUSCULOSKELETAL PAIN 02/25/2008   Prior to Admission medications   Medication Sig Start Date End Date Taking? Authorizing Provider  albuterol (PROVENTIL HFA) 108 (90 BASE) MCG/ACT inhaler Inhale 2 puffs into the lungs every 4 (four) hours as needed for wheezing or shortness of breath. 07/24/14  Yes Orma Flaming, MD  Fluticasone-Salmeterol (ADVAIR DISKUS) 100-50 MCG/DOSE AEPB Inhale 1 puff into the lungs 2 (two) times daily. 07/24/14  Yes Orma Flaming, MD  loratadine (CLARITIN) 10 MG tablet Take 10 mg by mouth daily.   Yes Historical Provider, MD  ipratropium (ATROVENT) 0.03 % nasal spray Place 2 sprays into the nose 2 (two) times daily as needed for rhinitis.    Historical Provider, MD   Medications, allergies, past medical history, surgical history, family history, social history and problem list reviewed and updated.  HPI  51 yom with PMH asthma and allergic rhinitis presents with DOE, wheezing, and chest pain.   He has a hx of several pneumonia infxs over past couple yrs. He was started on both albuterol prn and bid advair at least 10 yrs ago. He is unsure who started these. Doesn't recall ever formally being diagnosed with asthma or copd. Never been a smoker. No fam hx lung dz. He cannot recall ever having seen pulm.   He was seen here 6 weeks ago, diagnosed with RML pneumonia, and started on azithro and pred. 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 This lipoma is not mentioned in prior cxrs. Today he states his breathing maybe improved a slight amount for a bit but has continued on for past 6 weeks  and has been slightly worse the past 2 weeks. He denies any SOB at rest. Denies PND or orthopnea. He has been having DOE with moderate exertion that has not improved since he was last seen here. He states this actually goes back approx 5 years. Denies any fever at home. Slight nonprod cough past week. Improved with hydrocodone.   He also mentions chest tightness around both the right and left sides along his ribs. He was in an accident in 2008 and broke his collarbone and 5 ribs on left side. Chest tightness has been present since then. No exertional CP. Chest tightness comes on when he takes deep breaths. He exercises in gym 1-2x/week without CP. Chest tightness has not worsened past 6 weeks. No syncope, palps. No htn, high cho, dm2.   He has hx heartburn for yrs. Has been worse past week with Thanksgiving. Tums 1-2x/month provides relief.   Review of Systems No fever, chills, unintentional weight loss, night sweats.     Objective:   Physical Exam  Constitutional: He is oriented to person, place, and time. He appears well-developed and well-nourished.  Non-toxic appearance. He does not have a sickly appearance. He does not appear ill. No distress.  BP 124/78 mmHg  Pulse 87  Temp(Src) 98.2 F (36.8 C) (Oral)  Resp 28  Ht 5' 7"  (1.702 m)  Wt 180 lb 4 oz (81.761 kg)  BMI 28.22 kg/m2  SpO2 96%   Cardiovascular: Normal rate, regular rhythm, S1 normal and S2 normal.   Murmur heard.  Systolic murmur is present with a grade of 2/6   No diastolic murmur is present  Pulses:      Posterior tibial pulses are 2+ on the right side, and 2+ on the left side.  2/6 SEM loudest at RSB. No JVD. No TTP over chest. Carotids normal without bruit.   Pulmonary/Chest: Effort normal and breath sounds normal. No respiratory distress. He has no decreased breath sounds. He has no wheezes. He has no rhonchi. He has no rales.  Neurological: He is alert and oriented to person, place, and time.  Psychiatric: He has a  normal mood and affect. His speech is normal.   EKG read by Dr Everlene Farrier: Normal  Spirometry: FEV1 57% predicted. FVC 59% predicted. FEV1/FVC 96%. Suggestive of restrictive lung dz.   [facility] reading (PRIMARY) by  Dr. Everlene Farrier. Findings: Large loculated effusion in right lung.   Results for orders placed or performed in visit on 09/09/14  POCT CBC  Result Value Ref Range   WBC 11.2 (A) 4.6 - 10.2 K/uL   Lymph, poc 2.0 0.6 - 3.4   POC LYMPH PERCENT 18.0 10 - 50 %L   MID (cbc) 0.9 0 - 0.9   POC MID % 8.1 0 - 12 %M   POC Granulocyte 8.3 (A) 2 - 6.9   Granulocyte percent 73.9 37 - 80 %G   RBC 5.59 4.69 - 6.13 M/uL   Hemoglobin 15.3 14.1 - 18.1 g/dL   HCT, POC 47.1 43.5 - 53.7 %   MCV 84.3 80 - 97 fL   MCH, POC 27.5 27 - 31.2 pg   MCHC 32.6 31.8 - 35.4 g/dL   RDW, POC 13.7 %   Platelet Count, POC 411 142 - 424 K/uL   MPV 6.6 0 - 99.8 fL      Assessment & Plan:   38 yom with PMH asthma and allergic rhinitis presents with DOE, wheezing, and chest pain.   Dyspnea on exertion - Plan: PFT PULM FXN SPIROMETRY (94010), EKG 12-Lead, DG Chest 2 View Abnormal pulmonary function test - Plan: Ambulatory referral to Pulmonology --ekg normal, low concern for cardiac as has been present for many years --restrictive lung dz pattern on spirometry --pt without known exposures or family hx --pulm referral --possible cardiology eval in future if sx persist despite pulm tx  Abnormal chest xray - Plan: POCT CBC, Sedimentation Rate, Ambulatory referral to Interventional Radiology --See above read --Dr Everlene Farrier spoke with IR, thoracentesis tomorrow morning. Pt aware.  --will f/u on results --cbc mild leukocytosis, no fever today --esr pending  Julieta Gutting, PA-C Physician Assistant-Certified [facility]  09/09/2014 3:36 PM

## 2014-09-09 NOTE — Patient Instructions (Addendum)
Your pulmonary function test today was abnormal. It showed that the most likely cause of your breathing trouble is restrictive lung disease. We have referred you to a pulmonary specialist. They will be in contact with you to schedule.  Your EKG looked normal today. Depending on what the lung doctors think they may also want you to see cardiology. At this point, however, your symptoms sound more like lung issues than heart issues.  Your chest xray showed a buildup of fluid in the right lung. This will need to be drained by radiology and the fluid will need to be analyzed to to help determine what the cause is. We have contacted them, plan is for tomorrow. You should go to Interventional Radiology around [place] to [facility] and go straight to Radiology department, then to Interventional Radiology. Dr. Vernard Gambles is aware.

## 2014-09-10 ENCOUNTER — Ambulatory Visit (HOSPITAL_COMMUNITY)
Admission: RE | Admit: 2014-09-10 | Discharge: 2014-09-10 | Disposition: A | Discharge status: Home / Self Care | Payer: Managed Care, Other (non HMO) | Source: Ambulatory Visit | Attending: Emergency Medicine | Admitting: Emergency Medicine

## 2014-09-10 ENCOUNTER — Ambulatory Visit (HOSPITAL_COMMUNITY)
Admission: RE | Admit: 2014-09-10 | Discharge: 2014-09-10 | Disposition: A | Discharge status: Home / Self Care | Payer: Managed Care, Other (non HMO) | Source: Ambulatory Visit | Attending: Interventional Radiology | Admitting: Interventional Radiology

## 2014-09-10 DIAGNOSIS — R0609 Other forms of dyspnea: Secondary | ICD-10-CM

## 2014-09-10 DIAGNOSIS — Z9889 Other specified postprocedural states: Secondary | ICD-10-CM

## 2014-09-10 DIAGNOSIS — J90 Pleural effusion, not elsewhere classified: Secondary | ICD-10-CM | POA: Diagnosis present

## 2014-09-10 DIAGNOSIS — R9389 Abnormal findings on diagnostic imaging of other specified body structures: Secondary | ICD-10-CM

## 2014-09-10 LAB — PH, BODY FLUID: PH, FLUID: 8

## 2014-09-10 LAB — BODY FLUID CELL COUNT WITH DIFFERENTIAL
EOS FL: 27 %
Lymphs, Fluid: 53 %
MONOCYTE-MACROPHAGE-SEROUS FLUID: 17 % — AB (ref 50–90)
Neutrophil Count, Fluid: 3 % (ref 0–25)
Total Nucleated Cell Count, Fluid: 3722 cu mm — ABNORMAL HIGH (ref 0–1000)

## 2014-09-10 LAB — GLUCOSE, SEROUS FLUID: GLUCOSE FL: 182 mg/dL

## 2014-09-10 LAB — PROTEIN, BODY FLUID: Total protein, fluid: 5 g/dL

## 2014-09-10 LAB — LACTATE DEHYDROGENASE, PLEURAL OR PERITONEAL FLUID: LD, Fluid: 546 U/L — ABNORMAL HIGH (ref 3–23)

## 2014-09-10 MED ORDER — LIDOCAINE HCL (PF) 1 % IJ SOLN
INTRAMUSCULAR | Status: AC
  Filled 2014-09-10: qty 10

## 2014-09-10 NOTE — Addendum Note (Signed)
Addended by: Orion Crook on: 09/10/2014 10:48 AM   Modules accepted: Orders

## 2014-09-10 NOTE — Procedures (Signed)
[place] R thoracentesis 976ml amber fluid Mild self limited vagal response. CXR pending EBL nil ] DDH

## 2014-09-12 ENCOUNTER — Telehealth: Payer: Self-pay | Admitting: Radiology

## 2014-09-12 ENCOUNTER — Telehealth: Payer: Self-pay | Admitting: *Deleted

## 2014-09-12 NOTE — Telephone Encounter (Signed)
-----   Message from Tanda Rockers, MD sent at 09/12/2014  8:50 AM EST ----- Consult by end of week to f/u pleural effusion

## 2014-09-12 NOTE — Telephone Encounter (Signed)
I called [facility] to get sooner appt for patient. Dr Everlene Farrier states Dec 15th is not acceptable. Dr Everlene Farrier has spoken to on call physician Dr Melvyn Novas, they will contact him today and get him in. I have left message for patient to call me back.

## 2014-09-12 NOTE — Telephone Encounter (Signed)
Spoke with the pt's spouse and scheduled him to see MW at 3:30 pm tomorrow  Office address was given and aware to arrive 15 min prior to appt time

## 2014-09-12 NOTE — Telephone Encounter (Signed)
Dr Everlene Farrier has spoken to patient. I have spoken to patient. He is still short of breath. He will call me back if he does not hear back from pulmonology by 2pm today.

## 2014-09-12 NOTE — Telephone Encounter (Signed)
[phone] wife Glenard Haring

## 2014-09-12 NOTE — Telephone Encounter (Signed)
Patient has gotten message to schedule with pulmonology. He is returning call now he is asking for work note to cover for time missed yesterday, note provided.

## 2014-09-12 NOTE — Telephone Encounter (Signed)
LMTCB

## 2014-09-13 ENCOUNTER — Ambulatory Visit (INDEPENDENT_AMBULATORY_CARE_PROVIDER_SITE_OTHER): Payer: Managed Care, Other (non HMO) | Admitting: Internal Medicine

## 2014-09-13 ENCOUNTER — Other Ambulatory Visit (INDEPENDENT_AMBULATORY_CARE_PROVIDER_SITE_OTHER): Payer: Managed Care, Other (non HMO)

## 2014-09-13 ENCOUNTER — Encounter: Payer: Self-pay | Admitting: Internal Medicine

## 2014-09-13 VITALS — BP 126/82 | HR 82 | Ht 67.0 in | Wt 184.6 lb

## 2014-09-13 DIAGNOSIS — R059 Cough, unspecified: Secondary | ICD-10-CM

## 2014-09-13 DIAGNOSIS — J90 Pleural effusion, not elsewhere classified: Secondary | ICD-10-CM

## 2014-09-13 DIAGNOSIS — R05 Cough: Secondary | ICD-10-CM

## 2014-09-13 LAB — CBC WITH DIFFERENTIAL/PLATELET
BASOS PCT: 0.5 % (ref 0.0–3.0)
Basophils Absolute: 0.1 10*3/uL (ref 0.0–0.1)
EOS ABS: 0.9 10*3/uL — AB (ref 0.0–0.7)
Eosinophils Relative: 8.9 % — ABNORMAL HIGH (ref 0.0–5.0)
HCT: 41.7 % (ref 39.0–52.0)
HEMOGLOBIN: 13.7 g/dL (ref 13.0–17.0)
Lymphocytes Relative: 16.2 % (ref 12.0–46.0)
Lymphs Abs: 1.6 10*3/uL (ref 0.7–4.0)
MCHC: 33 g/dL (ref 30.0–36.0)
MCV: 83.5 fl (ref 78.0–100.0)
MONO ABS: 0.8 10*3/uL (ref 0.1–1.0)
Monocytes Relative: 8.8 % (ref 3.0–12.0)
NEUTROS ABS: 6.3 10*3/uL (ref 1.4–7.7)
Neutrophils Relative %: 65.6 % (ref 43.0–77.0)
Platelets: 385 10*3/uL (ref 150.0–400.0)
RBC: 4.99 Mil/uL (ref 4.22–5.81)
RDW: 13.7 % (ref 11.5–15.5)
WBC: 9.6 10*3/uL (ref 4.0–10.5)

## 2014-09-13 LAB — BODY FLUID CULTURE: Culture: NO GROWTH

## 2014-09-13 LAB — SEDIMENTATION RATE: Sed Rate: 46 mm/hr — ABNORMAL HIGH (ref 0–22)

## 2014-09-13 MED ORDER — PREDNISONE 10 MG PO TABS: ORAL_TABLET | ORAL | Status: DC

## 2014-09-13 MED ORDER — HYDROCODONE-HOMATROPINE 5-1.5 MG/5ML PO SYRP: 5.0000 mL | ORAL_SOLUTION | Freq: Four times a day (QID) | ORAL | Status: DC | PRN

## 2014-09-13 NOTE — Progress Notes (Signed)
Subjective:    Patient ID: [name] [name], male    DOB: [birth year],  MRN: [id number]  HPI  10 yowm never really smoked at all told he asthma as child and could not play basketball in middle school due to sob but no w/u apparently done at that point, no sports in HS >  Did ok but since 2010 every time gets cold ends up in chest needs abx and prednisone and given inhalers and eventually advair but did not take it as maint Jan 2012 dx pericarditis no surgery needed and resolved s surgery or cardiology eval then around early Oct 2015 experienced acute onset  coughing >>dark brown sputum and pain R chest worse lying down rx with abx then bilateral cp R > L and new cough with R pl effusion Tapped 09/10/14 x 970 ml with increased eos and referred to pulmonary clinic  09/13/2014 by Dr Everlene Farrier   09/13/2014 1st [facility] office visit/ [name] [name]   Chief Complaint  Patient presents with  . Pulmonary Consult    Referred by Dr. Laney Pastor for eval of pleural effusion. Pt c/o DOE on and off "for a while" worse x 2 months. He also c/o non prod cough and occ CP with exertion.   Feeling better since tap with min residual cp with deep breath.  No obvious other patterns in day to day or daytime variabilty or assoc chronic cough or cp or chest tightness, subjective wheeze overt sinus or hb symptoms. No unusual exp hx or h/o childhood pna/ asthma or knowledge of premature birth.  Sleeping ok without nocturnal  or early am exacerbation  of respiratory  c/o's or need for noct saba. Also denies any obvious fluctuation of symptoms with weather or environmental changes or other aggravating or alleviating factors except as outlined above   Current Medications, Allergies, Complete Past Medical History, Past Surgical History, Family History, and Social History were reviewed in [employer] record.            Review of Systems  Constitutional: Negative for fever, chills, activity change, appetite  change and unexpected weight change.  HENT: Positive for congestion. Negative for dental problem, postnasal drip, rhinorrhea, sneezing, sore throat, trouble swallowing and voice change.   Eyes: Negative for visual disturbance.  Respiratory: Positive for cough and shortness of breath. Negative for choking.   Cardiovascular: Positive for chest pain. Negative for leg swelling.  Gastrointestinal: Negative for nausea, vomiting and abdominal pain.  Genitourinary: Negative for difficulty urinating.       Acid heartburn  Musculoskeletal: Negative for arthralgias.  Skin: Negative for rash.  Psychiatric/Behavioral: Negative for behavioral problems and confusion.       Objective:   Physical Exam  amb wm nad  Wt Readings from Last 3 Encounters:  09/13/14 184 lb 9.6 oz (83.734 kg)  09/09/14 180 lb 4 oz (81.761 kg)  07/24/14 176 lb 6.4 oz (80.015 kg)    Vital signs reviewed   HEENT: nl dentition, turbinates, and orophanx. Nl external ear canals without cough reflex   NECK :  without JVD/Nodes/TM/ nl carotid upstrokes bilaterally   LUNGS: no acc muscle use, clear to A and P bilaterally without cough on insp or exp maneuvers   CV:  RRR  no s3 or murmur or increase in P2, no edema   ABD:  soft and nontender with nl excursion in the supine position. No bruits or organomegaly, bowel sounds nl  MS:  warm without deformities, calf tenderness, cyanosis or  clubbing  SKIN: warm and dry without lesions    NEURO:  alert, approp, no deficits    09/10/14 cxr Resolution of right pleural effusion post thoracentesis, with no Pneumothorax.  Lab Results  Component Value Date   WBC 9.6 09/13/2014   HGB 13.7 09/13/2014   HCT 41.7 09/13/2014   MCV 83.5 09/13/2014   PLT 385.0 09/13/2014   with Eos 8.9%      Assessment & Plan:

## 2014-09-13 NOTE — Patient Instructions (Addendum)
Most likely diagnosis is parapneumonic process late phase   Please remember to go to the lab  department downstairs for your tests - we will call you with the results when they are available.  Prednisone 10 mg take  4 each am x 2 days,   2 each am x 2 days,  1 each am x 2 days and stop   Pepcid ac 20 mg along with chloretrimeton 4 mg x 2 take at bedtime, if still hacking ok to use hydromet 1-2 tsp at bedimt   Please schedule a follow up office visit in 1 week, sooner if needed with a cxr on return

## 2014-09-14 NOTE — Progress Notes (Signed)
Quick Note:  Spoke with pt and notified of results per Dr. Wert. Pt verbalized understanding and denied any questions.  ______ 

## 2014-09-16 DIAGNOSIS — J3489 Other specified disorders of nose and nasal sinuses: Secondary | ICD-10-CM | POA: Insufficient documentation

## 2014-09-16 DIAGNOSIS — R05 Cough: Secondary | ICD-10-CM | POA: Insufficient documentation

## 2014-09-16 DIAGNOSIS — R058 Other specified cough: Secondary | ICD-10-CM | POA: Insufficient documentation

## 2014-09-16 HISTORY — DX: Other specified disorders of nose and nasal sinuses: J34.89

## 2014-09-16 NOTE — Assessment & Plan Note (Signed)
?   Cough variant asthma vs upper airway cough syndrome  Will try hs h1 and h2  And very short course prednisone then regroup in one week

## 2014-09-16 NOTE — Assessment & Plan Note (Addendum)
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   If not, vats bx/ pleurodesis will need to be considered.    F/u planned in one week

## 2014-09-22 ENCOUNTER — Ambulatory Visit (INDEPENDENT_AMBULATORY_CARE_PROVIDER_SITE_OTHER): Payer: Managed Care, Other (non HMO) | Admitting: Internal Medicine

## 2014-09-22 ENCOUNTER — Encounter: Payer: Self-pay | Admitting: Internal Medicine

## 2014-09-22 ENCOUNTER — Ambulatory Visit (INDEPENDENT_AMBULATORY_CARE_PROVIDER_SITE_OTHER)
Admission: RE | Admit: 2014-09-22 | Discharge: 2014-09-22 | Disposition: A | Discharge status: Home / Self Care | Payer: Managed Care, Other (non HMO) | Source: Ambulatory Visit | Attending: Internal Medicine | Admitting: Internal Medicine

## 2014-09-22 VITALS — BP 118/80 | HR 50 | Ht 67.0 in | Wt 179.0 lb

## 2014-09-22 DIAGNOSIS — J948 Other specified pleural conditions: Secondary | ICD-10-CM

## 2014-09-22 DIAGNOSIS — J90 Pleural effusion, not elsewhere classified: Secondary | ICD-10-CM

## 2014-09-22 DIAGNOSIS — Z23 Encounter for immunization: Secondary | ICD-10-CM

## 2014-09-22 NOTE — Patient Instructions (Signed)
Pneuomovax now and prevnar 13 in one year   Return here immediately if persistent cough, worse breathing or need rescue more than twice weekly

## 2014-09-22 NOTE — Progress Notes (Signed)
Subjective:    Patient ID: [name] [name], male    DOB: 06-05-1966,  MRN: [id number]    Brief patient profile:  29 yowm never really smoked at all told he asthma as child and could not play basketball in middle school due to sob but no w/u apparently done at that point, no sports in HS >  Did ok but since 2010 every time gets cold ends up in chest needs abx and prednisone and given inhalers and eventually advair rec  but did not take it as maint Jan 2012 dx pericarditis no surgery needed and resolved s surgery or cardiology eval then around early Oct 2015 experienced acute onset  coughing >>dark brown sputum and pain R chest worse lying down rx with abx then bilateral cp R > L and new cough with R pl effusion Tapped 09/10/14 x 970 ml with increased eos and referred to pulmonary clinic  09/13/2014 by Dr Everlene Farrier    History of Present Illness  09/13/2014 1st [facility] [facility] office visit/ Wert   Chief Complaint  Patient presents with  . Pulmonary Consult    Referred by Dr. Laney Pastor for eval of pleural effusion. Pt c/o DOE on and off "for a while" worse x 2 months. He also c/o non prod cough and occ CP with exertion.   Feeling better since tap with min residual cp with deep breath. 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 Follow-up    CXR done. Cough, DOE and chest discomfort are much improved. No new co's today.    Not limited by breathing from desired activities    No obvious day to day or daytime variabilty or assoc chronic cough or cp or chest tightness, subjective wheeze overt sinus or hb symptoms. No unusual exp hx or h/o childhood pna/  asthma or knowledge of premature birth.  Sleeping ok without nocturnal  or early am exacerbation  of respiratory  c/o's or need for noct saba. Also denies any obvious fluctuation of symptoms with weather or environmental changes or other aggravating or alleviating factors except as outlined above   Current Medications, Allergies, Complete Past Medical History, Past Surgical History, Family History, and Social History were reviewed in [employer] record.  ROS  The following are not active complaints unless bolded sore throat, dysphagia, dental problems, itching, sneezing,  nasal congestion or excess/ purulent secretions, ear ache,   fever, chills, sweats, unintended wt loss, pleuritic or exertional cp, hemoptysis,  orthopnea pnd or leg swelling, presyncope, palpitations, heartburn, abdominal pain, anorexia, nausea, vomiting, diarrhea  or change in bowel or urinary habits, change in stools or urine, dysuria,hematuria,  rash, arthralgias, visual complaints, headache, numbness weakness or ataxia or problems with walking or coordination,  change in mood/affect or memory.                 Objective:   Physical Exam  amb wm nad   Wt Readings from Last 3 Encounters:  09/13/14 184 lb 9.6 oz (83.734 kg)  09/09/14 180 lb 4 oz (81.761  kg)  07/24/14 176 lb 6.4 oz (80.015 kg)    Vital signs reviewed    HEENT: nl dentition, turbinates, and orophanx. Nl external ear canals without cough reflex   NECK :  without JVD/Nodes/TM/ nl carotid upstrokes bilaterally   LUNGS: no acc muscle use, clear to A and P bilaterally without cough on insp or exp maneuvers   CV:  RRR  no s3 or murmur or increase in P2, no edema   ABD:  soft and nontender with nl excursion in the supine position. No bruits or organomegaly, bowel sounds nl  MS:  warm without deformities, calf tenderness, cyanosis or clubbing  SKIN: warm and dry without lesions    NEURO:  alert, approp, no deficits         Lab Results  Component Value Date   WBC 9.6 09/13/2014   HGB 13.7 09/13/2014   HCT 41.7 09/13/2014   MCV 83.5 09/13/2014   PLT 385.0 09/13/2014   with Eos 8.9%    Lab Results  Component Value Date   ESRSEDRATE 46* 09/13/2014   ESRSEDRATE 5 10/18/2010     CXR PA and Lateral:   09/22/2014 : Tiny right pleural effusion. Mild right base atelectasis   Assessment & Plan:

## 2014-09-23 ENCOUNTER — Encounter: Payer: Self-pay | Admitting: Internal Medicine

## 2014-09-24 NOTE — Assessment & Plan Note (Addendum)
-    R Tap x 970 ml x 09/10/14 exudative with wbc 3722 27% eos  Vs 8% peripheally on 09/13/14  And nl glucose/ ph c/w uncomplicated late parapneumoic process  - ESR 09/13/2014  46 > rx Prednisone 10 mg take  4 each am x 2 days,   2 each am x 2 days,  1 each am x 2 days and stop  There is very little residual effusion on R on today's cxr and he feels fine  Reviewed the literature with pts with eosinophilic effusions which are common in pts with any kind of trauma to chest introducing air or blood into the pleural space but can be seen with just about any other cause of effusion and don't really narrow or change the ddx  Most likely this  was parapneumonic  And will not need further evaluation but asked pt to call back immediately for any recurrent chest symptoms with next step likely vats bx

## 2014-09-26 ENCOUNTER — Institutional Professional Consult (permissible substitution): Payer: Self-pay | Admitting: Pulmonary Disease

## 2014-09-27 ENCOUNTER — Encounter: Payer: Self-pay | Admitting: Emergency Medicine

## 2014-10-06 LAB — FUNGUS CULTURE W SMEAR: Fungal Smear: NONE SEEN

## 2014-10-16 ENCOUNTER — Encounter: Payer: Self-pay | Admitting: Internal Medicine

## 2014-10-16 ENCOUNTER — Ambulatory Visit (INDEPENDENT_AMBULATORY_CARE_PROVIDER_SITE_OTHER)
Admission: RE | Admit: 2014-10-16 | Discharge: 2014-10-16 | Disposition: A | Discharge status: Home / Self Care | Payer: Managed Care, Other (non HMO) | Source: Ambulatory Visit | Attending: Internal Medicine | Admitting: Internal Medicine

## 2014-10-16 ENCOUNTER — Ambulatory Visit (INDEPENDENT_AMBULATORY_CARE_PROVIDER_SITE_OTHER): Payer: Managed Care, Other (non HMO) | Admitting: Internal Medicine

## 2014-10-16 ENCOUNTER — Other Ambulatory Visit (INDEPENDENT_AMBULATORY_CARE_PROVIDER_SITE_OTHER): Payer: Managed Care, Other (non HMO)

## 2014-10-16 VITALS — BP 142/80 | HR 60 | Temp 98.0°F | Ht 67.0 in | Wt 190.0 lb

## 2014-10-16 DIAGNOSIS — R059 Cough, unspecified: Secondary | ICD-10-CM

## 2014-10-16 DIAGNOSIS — J90 Pleural effusion, not elsewhere classified: Secondary | ICD-10-CM

## 2014-10-16 DIAGNOSIS — J948 Other specified pleural conditions: Secondary | ICD-10-CM

## 2014-10-16 DIAGNOSIS — R05 Cough: Secondary | ICD-10-CM

## 2014-10-16 LAB — CBC WITH DIFFERENTIAL/PLATELET
BASOS PCT: 0.6 % (ref 0.0–3.0)
Basophils Absolute: 0 10*3/uL (ref 0.0–0.1)
EOS ABS: 0.5 10*3/uL (ref 0.0–0.7)
Eosinophils Relative: 6.1 % — ABNORMAL HIGH (ref 0.0–5.0)
HCT: 41.3 % (ref 39.0–52.0)
HEMOGLOBIN: 13.6 g/dL (ref 13.0–17.0)
Lymphocytes Relative: 15.4 % (ref 12.0–46.0)
Lymphs Abs: 1.2 10*3/uL (ref 0.7–4.0)
MCHC: 32.9 g/dL (ref 30.0–36.0)
MCV: 82.6 fl (ref 78.0–100.0)
MONO ABS: 0.7 10*3/uL (ref 0.1–1.0)
Monocytes Relative: 10 % (ref 3.0–12.0)
NEUTROS ABS: 5.1 10*3/uL (ref 1.4–7.7)
Neutrophils Relative %: 67.9 % (ref 43.0–77.0)
Platelets: 338 10*3/uL (ref 150.0–400.0)
RBC: 5.01 Mil/uL (ref 4.22–5.81)
RDW: 14.3 % (ref 11.5–15.5)
WBC: 7.5 10*3/uL (ref 4.0–10.5)

## 2014-10-16 LAB — SEDIMENTATION RATE: Sed Rate: 21 mm/hr (ref 0–22)

## 2014-10-16 MED ORDER — AMOXICILLIN-POT CLAVULANATE 875-125 MG PO TABS: 1.0000 | ORAL_TABLET | Freq: Two times a day (BID) | ORAL | Status: DC

## 2014-10-16 MED ORDER — PANTOPRAZOLE SODIUM 40 MG PO TBEC: 40.0000 mg | DELAYED_RELEASE_TABLET | Freq: Every day | ORAL | Status: DC

## 2014-10-16 NOTE — Patient Instructions (Addendum)
Please remember to go to the lab and x-ray department downstairs for your tests - we will call you with the results when they are available.    Ok to take hycodan as needed but best non-sedating cough med =  delsym 2 tsp every 12 hours  Pantoprazole (protonix) 40 mg   Take 30-60 min before first meal of the day and Pepcid 20 mg one bedtime until return to office - this is the best way to tell whether stomach acid is contributing to your problem.    GERD (REFLUX)  is an extremely common cause of respiratory symptoms just like yours , many times with no obvious heartburn at all.    It can be treated with medication, but also with lifestyle changes including avoidance of late meals, excessive alcohol, smoking cessation, and avoid fatty foods, chocolate, peppermint, colas, red wine, and acidic juices such as orange juice.  NO MINT OR MENTHOL PRODUCTS SO NO COUGH DROPS  USE SUGARLESS CANDY INSTEAD (Jolley ranchers or Stover's or Life Savers) or even ice chips will also do - the key is to swallow to prevent all throat clearing. NO OIL BASED VITAMINS - use powdered substitutes.  Please see patient coordinator before you leave today  to schedule sinus CT   Augmentin 875 mg take one pill twice daily  X 10 days - take at breakfast and supper with large glass of water.  It would help reduce the usual side effects (diarrhea and yeast infections) if you ate cultured yogurt at lunch.   Call if not better in one week - sooner if worsen

## 2014-10-16 NOTE — Progress Notes (Signed)
Subjective:    Patient ID: [name] [name], male    DOB: January 19, 1966   MRN: [id number]    Brief patient profile:  39 yowm never really smoked at all told he asthma as child and could not play basketball in middle school due to sob but no w/u apparently done at that point, no sports in HS >  Did ok but since 2010 every time gets cold ends up in chest needs abx and prednisone and given inhalers and eventually advair rec  but did not take it as maint Jan 2012 dx pericarditis no surgery needed and resolved s surgery or cardiology eval then around early Oct 2015 experienced acute onset  coughing >>dark brown sputum and pain R chest worse lying down rx with abx then bilateral cp R > L and new cough with R pl effusion Tapped 09/10/14 x 970 ml with increased eos and referred to pulmonary clinic  09/13/2014 by [name] [name]    History of Present Illness  09/13/2014 1st [facility] office visit/ [name] [name]   Chief Complaint  Patient presents with  . Pulmonary Consult    Referred by [name] [name] for eval of pleural effusion. Pt c/o DOE on and off "for a while" worse x 2 months. He also c/o non prod cough and occ CP with exertion.   Feeling better since tap with min residual cp with deep breath. 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 Follow-up    CXR done. Cough, DOE and chest discomfort are much improved. No new co's today.   Not limited by breathing from desired activities   rec Pneuomovax now and prevnar 13 in one year  Return here immediately if persistent cough, worse breathing or need rescue more than twice weekly    10/16/2014 acute   ov/[name] [name] re:  acute cough/ congestion 100% the same except having more hb after supper  Chief Complaint  Patient presents with  . Acute Visit    Pt c/o cough and congestion x 4 days. He is coughing up green to brown sputum.       No obvious day to day or daytime variabilty or assoc sob or cp or chest tightness, subjective wheeze overt sinus or hb symptoms. No unusual exp hx or h/o childhood pna/ asthma or knowledge of premature birth.  Sleeping ok without nocturnal  or early am exacerbation  of respiratory  c/o's or need for noct saba. Also denies any obvious fluctuation of symptoms with weather or environmental changes or other aggravating or alleviating factors except as outlined above   Current Medications, Allergies, Complete Past Medical History, Past Surgical History, Family History, and Social History were reviewed in [employer] record.  ROS  The following are not active complaints unless bolded sore throat, dysphagia, dental problems, itching, sneezing,  nasal congestion or excess/ purulent secretions, ear ache,   fever, chills, sweats, unintended wt loss, pleuritic or exertional cp, hemoptysis,  orthopnea pnd or leg swelling, presyncope, palpitations, heartburn, abdominal pain, anorexia, nausea, vomiting, diarrhea  or change in  bowel or urinary habits, change in stools or urine, dysuria,hematuria,  rash, arthralgias, visual complaints, headache, numbness weakness or ataxia or problems with walking or coordination,  change in mood/affect or memory.                 Objective:   Physical Exam  amb wm nad   Wt Readings from Last 3 Encounters:  09/13/14 184 lb 9.6 oz (83.734 kg)  09/09/14 180 lb 4 oz (81.761 kg)  07/24/14 176 lb 6.4 oz (80.015 kg)    Vital signs reviewed    HEENT: nl dentition, turbinates, and orophanx. Nl external ear canals without cough reflex   NECK :  without JVD/Nodes/TM/ nl carotid upstrokes bilaterally   LUNGS: no acc muscle use, minimal  decrease bs at R base   CV:  RRR  no s3 or murmur or increase in P2, no edema   ABD:  soft and nontender with nl excursion in the supine position. No bruits or organomegaly, bowel sounds nl  MS:  warm without deformities, calf tenderness, cyanosis or clubbing  SKIN: warm and dry without lesions    NEURO:  alert, approp, no deficits       Lab Results  Component Value Date   WBC 7.5 10/16/2014   HGB 13.6 10/16/2014   HCT 41.3 10/16/2014   MCV 82.6 10/16/2014   PLT 338.0 10/16/2014   eos 6.1%     Lab Results  Component Value Date   ESRSEDRATE 21 10/16/2014   ESRSEDRATE 46* 09/13/2014   ESRSEDRATE 5 10/18/2010       CXR PA and Lateral:   10/16/14 Sinus ct  08/07/14 > neg  Added flutter valve to rx 08/02/14  - ent referral 08/14/2014 > [facility] eval c/w severe fungal laryngitis > improved on diflucan - allergy profile 08/14/14  >  IgE 39 with neg RAST   Assessment & Plan:

## 2014-10-17 ENCOUNTER — Ambulatory Visit (INDEPENDENT_AMBULATORY_CARE_PROVIDER_SITE_OTHER)
Admission: RE | Admit: 2014-10-17 | Discharge: 2014-10-17 | Disposition: A | Discharge status: Home / Self Care | Payer: Managed Care, Other (non HMO) | Source: Ambulatory Visit | Attending: Internal Medicine | Admitting: Internal Medicine

## 2014-10-17 DIAGNOSIS — R05 Cough: Secondary | ICD-10-CM

## 2014-10-17 DIAGNOSIS — R059 Cough, unspecified: Secondary | ICD-10-CM

## 2014-10-17 NOTE — Assessment & Plan Note (Addendum)
Recurrent ? Etiology   Needs ct sinus to complete the w/u  And rx with augmentin in meantime

## 2014-10-17 NOTE — Progress Notes (Signed)
Quick Note:  Spoke with pt and notified of results per Dr. Wert. Pt verbalized understanding and denied any questions.  ______ 

## 2014-10-17 NOTE — Assessment & Plan Note (Signed)
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

## 2014-10-19 LAB — QUANTIFERON TB GOLD ASSAY (BLOOD)
Interferon Gamma Release Assay: NEGATIVE
Mitogen value: 9.59 IU/mL
Quantiferon Nil Value: 0.04 IU/mL
Quantiferon Tb Ag Minus Nil Value: 0 IU/mL
TB AG VALUE: 0.04 [IU]/mL

## 2014-10-20 ENCOUNTER — Other Ambulatory Visit: Payer: Self-pay | Admitting: Internal Medicine

## 2014-10-20 MED ORDER — OMEPRAZOLE 40 MG PO CPDR: 40.0000 mg | DELAYED_RELEASE_CAPSULE | Freq: Every day | ORAL | Status: DC

## 2014-10-20 NOTE — Progress Notes (Signed)
Quick Note:  Spoke with pt and notified of results per Dr. Wert. Pt verbalized understanding and denied any questions.  ______ 

## 2014-10-23 LAB — AFB CULTURE WITH SMEAR (NOT AT ARMC): Acid Fast Smear: NONE SEEN

## 2014-10-26 ENCOUNTER — Telehealth: Payer: Self-pay | Admitting: Internal Medicine

## 2014-10-26 MED ORDER — PANTOPRAZOLE SODIUM 40 MG PO TBEC: DELAYED_RELEASE_TABLET | ORAL | Status: DC

## 2014-10-26 NOTE — Telephone Encounter (Signed)
Never heard of that but that's fine either way - the other option is just to request  the PA and I bet we find out on the form that there is one that's covered instead of making [place] play this guessing game about what's covered - in the meantime he can certainly try the prilosec otc

## 2014-10-26 NOTE — Telephone Encounter (Signed)
lmomtcb x1 for pt 

## 2014-10-26 NOTE — Telephone Encounter (Signed)
MW--pts insurance does not cover the omeprazole 40 mg  1 daily.  Would you like to change this to a different medication or would you like to proceed with the PA.  Thanks  No Known Allergies  Current Outpatient Prescriptions on File Prior to Visit  Medication Sig Dispense Refill  . albuterol (PROVENTIL HFA) 108 (90 BASE) MCG/ACT inhaler Inhale 2 puffs into the lungs every 4 (four) hours as needed for wheezing or shortness of breath. 18 g 3  . amoxicillin-clavulanate (AUGMENTIN) 875-125 MG per tablet Take 1 tablet by mouth 2 (two) times daily. 20 tablet 0  . guaiFENesin (MUCINEX) 600 MG 12 hr tablet Take 600 mg by mouth 2 (two) times daily.    Marland Kitchen HYDROcodone-homatropine (HYCODAN) 5-1.5 MG/5ML syrup Take 5 mLs by mouth every 6 (six) hours as needed for cough. 240 mL 0  . omeprazole (PRILOSEC) 40 MG capsule Take 1 capsule (40 mg total) by mouth daily. 30 capsule 5  . Phenyleph-CPM-DM-Aspirin (ALKA-SELTZER PLUS COLD & COUGH) 7.05-14-09-325 MG TBEF Per box directions as needed     No current facility-administered medications on file prior to visit.

## 2014-10-26 NOTE — Telephone Encounter (Signed)
It probably covers Pantoprazole (protonix) 40 mg   Take 30-60 min before first meal of the day

## 2014-10-26 NOTE — Telephone Encounter (Signed)
Pt called back. Aware protonix sent in. Nothing further needed

## 2014-10-26 NOTE — Telephone Encounter (Signed)
Spoke with pt, states that pharmacy cannot fill protonix rx because they need more documentation from doctor, doesn't know what documentation they need.  Called pharmacy, pharmacist states that protonix needs a PA.  There is a telephone note from earlier today saying that omeprazole 40mg  is not covered by insurance either.  Pharmacist states that STEP therapy is required for protonix, so he would need to try and fail at 20mg  daily before pharmacy will cover anything higher.  Dr. Melvyn Novas do you want to bring pt down to protonix 20mg  qd, or have pt buy omeprazole otc?  Thanks

## 2014-10-27 NOTE — Telephone Encounter (Signed)
Pt called back and is aware of below.

## 2014-10-27 NOTE — Telephone Encounter (Signed)
Called for PA protonix. This was approved x 3 years starting with today's date 10/27/14. [facility] aware Called pt and LMTCB x1 to make aware

## 2014-10-27 NOTE — Telephone Encounter (Signed)
Called for PA for protonix- [phone] Pt ID number [id number]  Requested form to be faxed to triage Will await the fax

## 2014-11-16 ENCOUNTER — Ambulatory Visit: Payer: Self-pay | Admitting: Internal Medicine

## 2014-11-21 ENCOUNTER — Emergency Department (HOSPITAL_BASED_OUTPATIENT_CLINIC_OR_DEPARTMENT_OTHER)
Admission: EM | Admit: 2014-11-21 | Discharge: 2014-11-21 | Disposition: A | Discharge status: Home / Self Care | Payer: Managed Care, Other (non HMO) | Attending: Emergency Medicine | Admitting: Emergency Medicine

## 2014-11-21 ENCOUNTER — Emergency Department (HOSPITAL_BASED_OUTPATIENT_CLINIC_OR_DEPARTMENT_OTHER): Payer: Managed Care, Other (non HMO)

## 2014-11-21 ENCOUNTER — Encounter (HOSPITAL_BASED_OUTPATIENT_CLINIC_OR_DEPARTMENT_OTHER): Payer: Self-pay | Admitting: *Deleted

## 2014-11-21 DIAGNOSIS — Z9889 Other specified postprocedural states: Secondary | ICD-10-CM | POA: Diagnosis not present

## 2014-11-21 DIAGNOSIS — N23 Unspecified renal colic: Secondary | ICD-10-CM

## 2014-11-21 DIAGNOSIS — J45909 Unspecified asthma, uncomplicated: Secondary | ICD-10-CM | POA: Diagnosis not present

## 2014-11-21 DIAGNOSIS — Z79899 Other long term (current) drug therapy: Secondary | ICD-10-CM | POA: Diagnosis not present

## 2014-11-21 DIAGNOSIS — Z8739 Personal history of other diseases of the musculoskeletal system and connective tissue: Secondary | ICD-10-CM | POA: Insufficient documentation

## 2014-11-21 DIAGNOSIS — R011 Cardiac murmur, unspecified: Secondary | ICD-10-CM | POA: Diagnosis not present

## 2014-11-21 DIAGNOSIS — R52 Pain, unspecified: Secondary | ICD-10-CM

## 2014-11-21 DIAGNOSIS — R109 Unspecified abdominal pain: Secondary | ICD-10-CM | POA: Diagnosis present

## 2014-11-21 LAB — BASIC METABOLIC PANEL
Anion gap: 5 (ref 5–15)
BUN: 13 mg/dL (ref 6–23)
CO2: 28 mmol/L (ref 19–32)
Calcium: 9 mg/dL (ref 8.4–10.5)
Chloride: 101 mmol/L (ref 96–112)
Creatinine, Ser: 1.11 mg/dL (ref 0.50–1.35)
GFR calc Af Amer: 88 mL/min — ABNORMAL LOW (ref >90)
GFR calc non Af Amer: 76 mL/min — ABNORMAL LOW (ref >90)
GLUCOSE: 184 mg/dL — AB (ref 70–99)
POTASSIUM: 3.7 mmol/L (ref 3.5–5.1)
Sodium: 134 mmol/L — ABNORMAL LOW (ref 135–145)

## 2014-11-21 LAB — CBC WITH DIFFERENTIAL/PLATELET
BASOS ABS: 0 10*3/uL (ref 0.0–0.1)
BASOS PCT: 0 % (ref 0–1)
EOS PCT: 1 % (ref 0–5)
Eosinophils Absolute: 0.1 10*3/uL (ref 0.0–0.7)
HEMATOCRIT: 41 % (ref 39.0–52.0)
HEMOGLOBIN: 13.7 g/dL (ref 13.0–17.0)
Lymphocytes Relative: 18 % (ref 12–46)
Lymphs Abs: 1.8 10*3/uL (ref 0.7–4.0)
MCH: 27.2 pg (ref 26.0–34.0)
MCHC: 33.4 g/dL (ref 30.0–36.0)
MCV: 81.3 fL (ref 78.0–100.0)
MONO ABS: 0.8 10*3/uL (ref 0.1–1.0)
MONOS PCT: 8 % (ref 3–12)
Neutro Abs: 7.5 10*3/uL (ref 1.7–7.7)
Neutrophils Relative %: 73 % (ref 43–77)
Platelets: 396 10*3/uL (ref 150–400)
RBC: 5.04 MIL/uL (ref 4.22–5.81)
RDW: 14.1 % (ref 11.5–15.5)
WBC: 10.3 10*3/uL (ref 4.0–10.5)

## 2014-11-21 LAB — URINE MICROSCOPIC-ADD ON

## 2014-11-21 LAB — URINALYSIS, ROUTINE W REFLEX MICROSCOPIC
Bilirubin Urine: NEGATIVE
Glucose, UA: NEGATIVE mg/dL
KETONES UR: NEGATIVE mg/dL
Leukocytes, UA: NEGATIVE
Nitrite: NEGATIVE
PROTEIN: NEGATIVE mg/dL
Specific Gravity, Urine: 1.024 (ref 1.005–1.030)
Urobilinogen, UA: 0.2 mg/dL (ref 0.0–1.0)
pH: 7 (ref 5.0–8.0)

## 2014-11-21 MED ORDER — HYDROMORPHONE HCL 1 MG/ML IJ SOLN
1.0000 mg | Freq: Once | INTRAMUSCULAR | Status: AC
  Administered 2014-11-21: 1 mg via INTRAVENOUS
  Filled 2014-11-21: qty 1

## 2014-11-21 MED ORDER — KETOROLAC TROMETHAMINE 30 MG/ML IJ SOLN
30.0000 mg | Freq: Once | INTRAMUSCULAR | Status: AC
  Administered 2014-11-21: 30 mg via INTRAVENOUS
  Filled 2014-11-21: qty 1

## 2014-11-21 MED ORDER — ONDANSETRON HCL 4 MG/2ML IJ SOLN
4.0000 mg | Freq: Once | INTRAMUSCULAR | Status: AC
  Administered 2014-11-21: 4 mg via INTRAVENOUS
  Filled 2014-11-21: qty 2

## 2014-11-21 MED ORDER — OXYCODONE-ACETAMINOPHEN 5-325 MG PO TABS
2.0000 | ORAL_TABLET | ORAL | Status: DC | PRN
Start: 2014-11-21 — End: 2014-12-29

## 2014-11-21 MED ORDER — TAMSULOSIN HCL 0.4 MG PO CAPS: 0.4000 mg | ORAL_CAPSULE | Freq: Every day | ORAL | Status: DC

## 2014-11-21 MED ORDER — SODIUM CHLORIDE 0.9 % IV SOLN
INTRAVENOUS | Status: DC
  Administered 2014-11-21: 17:00:00 via INTRAVENOUS

## 2014-11-21 NOTE — ED Notes (Signed)
Pt c/o sudden onset of right flank pain x 4 days ago

## 2014-11-21 NOTE — Discharge Instructions (Signed)

## 2014-11-21 NOTE — ED Notes (Signed)
Pt comes in today with a c/o right lower flank pain. Pt states this started about 2 weeks ago and has been intermittent but about 1.5 hours ago the pain got extremely worse. Pt denies any hx of kidney stones. Pt has some nausea as well.

## 2014-11-21 NOTE — ED Provider Notes (Signed)
CSN: [id number]     Arrival date & time 11/21/14  1613 History   First MD Initiated Contact with Patient 11/21/14 1627     Chief Complaint  Patient presents with  . Flank Pain     (Consider location/radiation/quality/duration/timing/severity/associated sxs/prior Treatment) HPI Comments: Patient here complaining of 2 weeks of intermittent right-sided flank pain with radiation to his right lower quadrant. Also notes urinary urgency without dysuria or hematuria. Has had nausea no vomiting. No fever or chills. No prior history of kidney stones. Symptoms persistent and nothing makes them better or worse. He has been using over-the-counter medications without relief  Patient is a 49 y.o. male presenting with flank pain. The history is provided by the patient.  Flank Pain    Past Medical History  Diagnosis Date  . Allergy   . Asthma   . Heart murmur   . Arthritis    Past Surgical History  Procedure Laterality Date  . Hernia repair     Family History  Problem Relation Age of Onset  . Diabetes Mother   . Cancer Mother     CHF  . Heart disease Mother     CHF  . Cancer Father     prostate  . Diabetes Father   . Kidney disease Father     CANCER  . Stroke Daughter   . Diabetes Maternal Grandmother   . Heart disease Maternal Grandfather     HEART ATTACK  . Asthma Mother   . Lung cancer Mother     smoked   History  Substance Use Topics  . Smoking status: Never Smoker   . Smokeless tobacco: Never Used  . Alcohol Use: No    Review of Systems  Genitourinary: Positive for flank pain.  All other systems reviewed and are negative.     Allergies  Review of patient's allergies indicates no known allergies.  Home Medications   Prior to Admission medications   Medication Sig Start Date End Date Taking? Authorizing Provider  albuterol (PROVENTIL HFA) 108 (90 BASE) MCG/ACT inhaler Inhale 2 puffs into the lungs every 4 (four) hours as needed for wheezing or shortness of  breath. 07/24/14   Orma Flaming, MD  HYDROcodone-homatropine [facility]) 5-1.5 MG/5ML syrup Take 5 mLs by mouth every 6 (six) hours as needed for cough. 09/13/14   Tanda Rockers, MD  pantoprazole (PROTONIX) 40 MG tablet Take 1 tablet 30-60 min before 1st meal of day 10/26/14   Tanda Rockers, MD   BP 143/76 mmHg  Pulse 86  Temp(Src) 98.2 F (36.8 C) (Oral)  Resp 16  Ht 5\' 7"  (1.702 m)  Wt 175 lb (79.379 kg)  BMI 27.40 kg/m2  SpO2 100% Physical Exam  Constitutional: He is oriented to person, place, and time. He appears well-developed and well-nourished.  Non-toxic appearance. No distress.  HENT:  Head: Normocephalic and atraumatic.  Eyes: Conjunctivae, EOM and lids are normal. Pupils are equal, round, and reactive to light.  Neck: Normal range of motion. Neck supple. No tracheal deviation present. No thyroid mass present.  Cardiovascular: Normal rate, regular rhythm and normal heart sounds.  Exam reveals no gallop.   No murmur heard. Pulmonary/Chest: Effort normal and breath sounds normal. No stridor. No respiratory distress. He has no decreased breath sounds. He has no wheezes. He has no rhonchi. He has no rales.  Abdominal: Soft. Normal appearance and bowel sounds are normal. He exhibits no distension. There is no tenderness. There is no rebound and no CVA  tenderness.  Musculoskeletal: Normal range of motion. He exhibits no edema or tenderness.  Neurological: He is alert and oriented to person, place, and time. He has normal strength. No cranial nerve deficit or sensory deficit. GCS eye subscore is 4. GCS verbal subscore is 5. GCS motor subscore is 6.  Skin: Skin is warm and dry. No abrasion and no rash noted.  Psychiatric: He has a normal mood and affect. His speech is normal and behavior is normal.  Nursing note and vitals reviewed.   ED Course  Procedures (including critical care time) Labs Review Labs Reviewed  URINE CULTURE  URINALYSIS, ROUTINE W REFLEX MICROSCOPIC  CBC WITH  DIFFERENTIAL/PLATELET  BASIC METABOLIC PANEL    Imaging Review No results found.   EKG Interpretation None      MDM   Final diagnoses:  Pain    Patient given medications for kidney stone and feels better. He'll be given urology referral    Leota Jacobsen, MD 11/21/14 [phone]

## 2014-11-22 ENCOUNTER — Encounter: Payer: Self-pay | Admitting: Internal Medicine

## 2014-11-22 ENCOUNTER — Ambulatory Visit (INDEPENDENT_AMBULATORY_CARE_PROVIDER_SITE_OTHER)
Admission: RE | Admit: 2014-11-22 | Discharge: 2014-11-22 | Disposition: A | Discharge status: Home / Self Care | Payer: Worker's Compensation | Source: Ambulatory Visit | Attending: Internal Medicine | Admitting: Internal Medicine

## 2014-11-22 ENCOUNTER — Ambulatory Visit (INDEPENDENT_AMBULATORY_CARE_PROVIDER_SITE_OTHER): Payer: Managed Care, Other (non HMO) | Admitting: Internal Medicine

## 2014-11-22 VITALS — BP 140/86 | HR 55 | Ht 67.0 in | Wt 183.0 lb

## 2014-11-22 DIAGNOSIS — J948 Other specified pleural conditions: Secondary | ICD-10-CM | POA: Diagnosis not present

## 2014-11-22 DIAGNOSIS — J90 Pleural effusion, not elsewhere classified: Secondary | ICD-10-CM

## 2014-11-22 DIAGNOSIS — R05 Cough: Secondary | ICD-10-CM

## 2014-11-22 DIAGNOSIS — R059 Cough, unspecified: Secondary | ICD-10-CM

## 2014-11-22 MED ORDER — PANTOPRAZOLE SODIUM 40 MG PO TBEC: 40.0000 mg | DELAYED_RELEASE_TABLET | Freq: Every day | ORAL | Status: DC

## 2014-11-22 MED ORDER — FAMOTIDINE 20 MG PO TABS: ORAL_TABLET | ORAL | Status: DC

## 2014-11-22 NOTE — Progress Notes (Signed)
Subjective:    Patient ID: [name] [name], male    DOB: May 22, 1966   MRN: [id number]    Brief patient profile:  43 yowm never really smoked at all told he asthma as child and could not play basketball in middle school due to sob but no w/u apparently done at that point, no sports in HS >  Did ok but since 2010 every time gets cold ends up in chest needs abx and prednisone and given inhalers and eventually advair rec  but did not take it as maint Jan 2012 dx pericarditis no surgery needed and resolved s surgery or cardiology eval then around early Oct 2015 experienced acute onset  coughing >>dark brown sputum and pain R chest worse lying down rx with abx then bilateral cp R > L and new cough with R pl effusion Tapped 09/10/14 x 970 ml with increased eos and referred to pulmonary clinic  09/13/2014 by Dr Everlene Farrier    History of Present Illness  09/13/2014 1st [facility] office visit/ Ylianna Almanzar   Chief Complaint  Patient presents with  . Pulmonary Consult    Referred by Dr. Laney Pastor for eval of pleural effusion. Pt c/o DOE on and off "for a while" worse x 2 months. He also c/o non prod cough and occ CP with exertion.   Feeling better since tap with min residual cp with deep breath. rec Most likely diagnosis is parapneumonic process late phase  Prednisone 10 mg take  4 each am x 2 days,   2 each am x 2 days,  1 each am x 2 days and stop  Pepcid ac 20 mg along with chloretrimeton 4 mg x 2 take at bedtime, if still hacking ok to use hydromet 1-2 tsp at bed     09/22/2014 f/u ov/Rudell Marlowe re: R parapneumonic effusion with increased Eos Chief Complaint  Patient presents with  . Follow-up    CXR done. Cough, DOE and chest discomfort are much improved. No new co's today.   Not limited by breathing from desired activities   rec Pneuomovax now and prevnar 13 in one year  Return here immediately if persistent cough, worse breathing or need rescue more than twice weekly    10/16/2014 acute   ov/Kais Monje re:  acute cough/ congestion 100% the same except having more hb after supper  Chief Complaint  Patient presents with  . Acute Visit    Pt c/o cough and congestion x 4 days. He is coughing up green to brown sputum.     rec Please remember to go to the lab and x-ray department downstairs for your tests - we will call you with the results when they are available.   Ok to take hycodan as needed but best non-sedating cough med =  delsym 2 tsp every 12 hours Pantoprazole (protonix) 40 mg   Take 30-60 min before first meal of the day and Pepcid 20 mg one bedtime until return to office - this is the best way to tell whether stomach acid is contributing to your problem.   GERD diet Please see patient coordinator before you leave today  to schedule sinus CT  Augmentin 875 mg take one pill twice daily  X 10 days - take at breakfast and supper with large glass of water.  It would help reduce the usual side effects (diarrhea and yeast infections) if you ate cultured yogurt at lunch.     11/22/2014 f/u ov/Lavena Loretto re: f/u effusion/ upper airway cough syndrome Chief  Complaint  Patient presents with  . Follow-up    Pt states that his breathing is doing well. No new co's today.   no real pattern to cough/ doesn't wake him but has sense of too much throat drainage better than it was protonix 30 min before helped some with daytime cough  but am's worse white mucus < a tsp   No obvious day to day or daytime variabilty or assoc sob or cp or chest tightness, subjective wheeze overt sinus or hb symptoms. No unusual exp hx or h/o childhood pna/ asthma or knowledge of premature birth.  Sleeping ok without nocturnal  or early am exacerbation  of respiratory  c/o's or need for noct saba. Also denies any obvious fluctuation of symptoms with weather or environmental changes or other aggravating or alleviating factors except as outlined above   Current Medications, Allergies, Complete Past Medical History, Past Surgical  History, Family History, and Social History were reviewed in [employer] record.  ROS  The following are not active complaints unless bolded sore throat, dysphagia, dental problems, itching, sneezing,  nasal congestion or excess/ purulent secretions, ear ache,   fever, chills, sweats, unintended wt loss, pleuritic or exertional cp, hemoptysis,  orthopnea pnd or leg swelling, presyncope, palpitations, heartburn, abdominal pain, anorexia, nausea, vomiting, diarrhea  or change in bowel or urinary habits, change in stools or urine, dysuria,hematuria,  rash, arthralgias, visual complaints, headache, numbness weakness or ataxia or problems with walking or coordination,  change in mood/affect or memory.                 Objective:   Physical Exam  amb wm nad    Wt Readings from Last 3 Encounters:  11/22/14 183 lb (83.008 kg)  11/21/14 175 lb (79.379 kg)  10/16/14 190 lb (86.183 kg)    Vital signs reviewed   HEENT: nl dentition, turbinates, and orophanx. Nl external ear canals without cough reflex   NECK :  without JVD/Nodes/TM/ nl carotid upstrokes bilaterally   LUNGS: no acc muscle use, minimal decrease bs at R base   CV:  RRR  no s3 or murmur or increase in P2, no edema   ABD:  soft and nontender with nl excursion in the supine position. No bruits or organomegaly, bowel sounds nl  MS:  warm without deformities, calf tenderness, cyanosis or clubbing  SKIN: warm and dry without lesions    NEURO:  alert, approp, no deficits       Lab Results  Component Value Date   WBC 7.5 10/16/2014   HGB 13.6 10/16/2014   HCT 41.3 10/16/2014   MCV 82.6 10/16/2014   PLT 338.0 10/16/2014   eos 6.1%     Lab Results  Component Value Date   ESRSEDRATE 21 10/16/2014   ESRSEDRATE 46* 09/13/2014   ESRSEDRATE 5 10/18/2010         Sinus ct  08/07/14 > neg  Added flutter valve to rx 08/02/14  - ent referral 08/14/2014 > [facility] eval c/w severe fungal laryngitis >  improved on diflucan - allergy profile 08/14/14  >  IgE 39 with neg RAST  CXR:  11/22/14  I personally reviewed images and agree with radiology impression as follows:   1. Cardiomegaly. 2. Bibasilar atelectasis and/or infiltrates with small stable right pleural effusion.    Assessment & Plan:

## 2014-11-22 NOTE — Patient Instructions (Signed)
Pantoprazole (protonix) 40 mg   Take 30-60 min before first meal of the day and Pepcid 20 mg one bedtime until return to see Dr Everlene Farrier  GERD (REFLUX)  is an extremely common cause of respiratory symptoms just like yours , many times with no obvious heartburn at all.    It can be treated with medication, but also with lifestyle changes including avoidance of late meals, excessive alcohol, smoking cessation, and avoid fatty foods, chocolate, peppermint, colas, red wine, and acidic juices such as orange juice.  NO MINT OR MENTHOL PRODUCTS SO NO COUGH DROPS  USE SUGARLESS CANDY INSTEAD (Jolley ranchers or Stover's or Life Savers) or even ice chips will also do - the key is to swallow to prevent all throat clearing. NO OIL BASED VITAMINS - use powdered substitutes.    If not better during the next 3 months with the throat congestion see Redmond Baseman. Otherwise see Dr Everlene Farrier at the end of 3 months   If you are satisfied with your treatment plan,  let your doctor know and he/she can either refill your medications or you can return here when your prescription runs out.     If in any way you are not 100% satisfied,  please tell us.  If 100% better, tell your friends!  Pulmonary follow up is as needed

## 2014-11-23 NOTE — Assessment & Plan Note (Signed)
-   Added flutter valve to rx 08/02/14  - ent referral 08/14/2014 > [facility] eval c/w severe fungal laryngitis > improved on diflucan - allergy profile 08/14/14  >  IgE 39 with neg RAST Sinus ct 10/17/2014 > Minimal mucosal thickening RIGHT maxillary sinus. No additional sinus abnormalities identified.  Most c/w  Classic Upper airway cough syndrome, so named because it's frequently impossible to sort out how much is  CR/sinusitis with freq throat clearing (which can be related to primary GERD)   vs  causing  secondary (" extra esophageal")  GERD from wide swings in gastric pressure that occur with throat clearing, often  promoting self use of mint and menthol lozenges that reduce the lower esophageal sphincter tone and exacerbate the problem further in a cyclical fashion.   These are the same pts (now being labeled as having "irritable larynx syndrome" by some cough centers) who not infrequently have a history of having failed to tolerate ace inhibitors,  dry powder inhalers or biphosphonates or report having atypical reflux symptoms that don't respond to standard doses of PPI , and are easily confused as having aecopd or asthma flares by even experienced allergists/ pulmonologists.  Would max rx for gerd for now and f/u Dr Redmond Baseman prn for "nose drainage"  No pulmonary f/u needed     Each maintenance medication was reviewed in detail including most importantly the difference between maintenance and as needed and under what circumstances the prns are to be used.  Please see instructions for details which were reviewed in writing and the patient given a copy.

## 2014-11-23 NOTE — Assessment & Plan Note (Signed)
-    R Tap x 970 ml x 09/10/14 exudative with wbc 3722 27% eos  Vs 8% peripherally on 09/13/14  And nl glucose/ ph  - ESR 09/13/2014  46 > rx Prednisone 10 mg take  4 each am x 2 days,   2 each am x 2 days,  1 each am x 2 days and stop > improved  -  10/16/14 > small reaccumulation with esr only 21 and eos 6% > quantiferon gold neg   Not enough effuson to cause symptoms, continue to feel this was uncomplicated late parapneumonic process and no f/u needed unless symptoms worsen

## 2014-11-24 LAB — URINE CULTURE: Colony Count: 15000

## 2014-11-25 ENCOUNTER — Telehealth (HOSPITAL_BASED_OUTPATIENT_CLINIC_OR_DEPARTMENT_OTHER): Payer: Self-pay | Admitting: Emergency Medicine

## 2014-11-25 NOTE — Telephone Encounter (Signed)
Post ED Visit - Positive Culture Follow-up  Culture report reviewed by antimicrobial stewardship pharmacist: []  Wes Dulaney, Pharm.D., BCPS [x]  Heide Guile, Pharm.D., BCPS []  Alycia Rossetti, Pharm.D., BCPS []  [place], [place].D., BCPS, AAHIVP []  Legrand Como, Pharm.D., BCPS, AAHIVP []  Isac Sarna, Pharm.D., BCPS  Positive urine culture E. coli Treated with Augmentin, organism sensitive to the same and no further patient follow-up is required at this time.  Hazle Nordmann 11/25/2014, 11:14 AM

## 2014-12-29 ENCOUNTER — Ambulatory Visit (INDEPENDENT_AMBULATORY_CARE_PROVIDER_SITE_OTHER): Payer: Managed Care, Other (non HMO)

## 2014-12-29 ENCOUNTER — Ambulatory Visit (INDEPENDENT_AMBULATORY_CARE_PROVIDER_SITE_OTHER): Payer: Managed Care, Other (non HMO) | Admitting: Family Medicine

## 2014-12-29 VITALS — BP 118/72 | HR 86 | Temp 98.2°F | Resp 17 | Ht 68.0 in | Wt 179.0 lb

## 2014-12-29 DIAGNOSIS — M791 Myalgia, unspecified site: Secondary | ICD-10-CM

## 2014-12-29 DIAGNOSIS — R938 Abnormal findings on diagnostic imaging of other specified body structures: Secondary | ICD-10-CM | POA: Diagnosis not present

## 2014-12-29 DIAGNOSIS — J029 Acute pharyngitis, unspecified: Secondary | ICD-10-CM

## 2014-12-29 DIAGNOSIS — R0789 Other chest pain: Secondary | ICD-10-CM | POA: Diagnosis not present

## 2014-12-29 DIAGNOSIS — J988 Other specified respiratory disorders: Secondary | ICD-10-CM | POA: Diagnosis not present

## 2014-12-29 DIAGNOSIS — R9389 Abnormal findings on diagnostic imaging of other specified body structures: Secondary | ICD-10-CM

## 2014-12-29 DIAGNOSIS — Z8701 Personal history of pneumonia (recurrent): Secondary | ICD-10-CM | POA: Diagnosis not present

## 2014-12-29 LAB — POCT CBC
GRANULOCYTE PERCENT: 74.4 % (ref 37–80)
HCT, POC: 45.5 % (ref 43.5–53.7)
HEMOGLOBIN: 13.8 g/dL — AB (ref 14.1–18.1)
LYMPH, POC: 2.3 (ref 0.6–3.4)
MCH, POC: 25.2 pg — AB (ref 27–31.2)
MCHC: 30.2 g/dL — AB (ref 31.8–35.4)
MCV: 83.3 fL (ref 80–97)
MID (cbc): 1 — AB (ref 0–0.9)
MPV: 7.1 fL (ref 0–99.8)
POC GRANULOCYTE: 9.4 — AB (ref 2–6.9)
POC LYMPH PERCENT: 18 %L (ref 10–50)
POC MID %: 7.6 % (ref 0–12)
Platelet Count, POC: 410 10*3/uL (ref 142–424)
RBC: 5.46 M/uL (ref 4.69–6.13)
RDW, POC: 13.9 %
WBC: 12.6 10*3/uL — AB (ref 4.6–10.2)

## 2014-12-29 LAB — POCT INFLUENZA A/B
INFLUENZA A, POC: NEGATIVE
INFLUENZA B, POC: NEGATIVE

## 2014-12-29 MED ORDER — DOXYCYCLINE HYCLATE 100 MG PO CAPS: 100.0000 mg | ORAL_CAPSULE | Freq: Two times a day (BID) | ORAL | Status: DC

## 2014-12-29 NOTE — Progress Notes (Signed)
MRN: [id number] DOB: 16-Oct-1965  Subjective:   [name] [name] is a 49 y.o. male presenting for chief complaint of Breathing Problem; Generalized Body Aches; Chills; and Nasal Congestion  Reports 2 day history of body aches, headache, chest tightness with deep inspiration, nasal congestion, sore throat x3 weeks, felt like he has had cold symptoms for 3 weeks. Taking Claritin daily for seasonal allergies. Also tried alka-seltzer cold/plus with some relief but no resolution of symptoms. Son was sick early 12/2014, dx with sinusitis, took antibiotic course. Denies fevers, cough, chest pain, shob, wheezing; also denies itchy or watery eyes, ear pain, ear drainage, tooth pain, n/v, abdominal pain. Of note, patient reports history of pneumonia complicated by pleural effusion 11-09/2014, has had close follow up with [facility]. Also reports a history of pericarditis ~4 years ago. Patient admits that his symptoms today are different from his previous symptoms when he had pneumonia and pericarditis, denies chest pain. Denies smoking, states that he has smoked only ~3 packs throughout his entire life. No alcohol use. Denies any other aggravating or relieving factors, no other questions or concerns.  [name] [name] is currently taking Claritin and Protonix. He has No Known Allergies.  [name] [name]  has a past medical history of Allergy; Asthma; Heart murmur; Arthritis; Kidney stones; Pericarditis; and Pneumonia. Also  has past surgical history that includes Hernia repair.  ROS As in subjective.  Objective:   Vitals: BP 118/72 mmHg  Pulse 86  Temp(Src) 98.2 F (36.8 C) (Oral)  Resp 17  Ht 5\' 8"  (1.727 m)  Wt 179 lb (81.194 kg)  BMI 27.22 kg/m2  SpO2 98%  Physical Exam  Constitutional: He is oriented to person, place, and time and well-developed, well-nourished, and in no distress.  HENT:  TM's flat bilaterally, no effusions or erythema. Nasal turbinates inflamed but nasal passages patent, yellow  rhinorrhea. Postnasal drip present, without oropharyngeal exudates.  Eyes: Conjunctivae are normal. Right eye exhibits no discharge. Left eye exhibits no discharge. No scleral icterus.  Cardiovascular: Normal rate, regular rhythm and intact distal pulses.  Exam reveals no gallop and no friction rub.   No murmur heard. Pulmonary/Chest: No stridor. No respiratory distress. He has no wheezes. He has rales (mild crackles at right lower bases). He exhibits no tenderness.  Lung sounds slightly decreased at bases bilaterally.  Musculoskeletal: He exhibits no edema or tenderness.  Lymphadenopathy:    He has no cervical adenopathy.  Neurological: He is alert and oriented to person, place, and time.  Skin: Skin is warm and dry. No rash noted. No erythema. No pallor.   [facility] reading (PRIMARY) by  Dr. Lorelei Pont. CXR: resolving effusion and fluid in right fissure, stable cardiomegaly   Dg Chest 2 View  12/29/2014   CLINICAL DATA:  Chest tightness, history pneumonia  EXAM: CHEST  2 VIEW  COMPARISON:  Radiograph 11/22/2014  FINDINGS: Normal mediastinum and cardiac silhouette. Normal pulmonary vasculature. No evidence of effusion, infiltrate, or pneumothorax. Improved atelectasis from prior No acute bony abnormality.  IMPRESSION: Improved atelectasis.  No acute cardiopulmonary process.   Electronically Signed   By: Suzy Bouchard M.D.   On: 12/29/2014 19:13   Results for orders placed or performed in visit on 12/29/14 (from the past 24 hour(s))  POCT CBC     Status: Abnormal   Collection Time: 12/29/14  7:11 PM  Result Value Ref Range   WBC 12.6 (A) 4.6 - 10.2 K/uL   Lymph, poc 2.3 0.6 - 3.4   POC LYMPH PERCENT 18.0  10 - 50 %L   MID (cbc) 1.0 (A) 0 - 0.9   POC MID % 7.6 0 - 12 %M   POC Granulocyte 9.4 (A) 2 - 6.9   Granulocyte percent 74.4 37 - 80 %G   RBC 5.46 4.69 - 6.13 M/uL   Hemoglobin 13.8 (A) 14.1 - 18.1 g/dL   HCT, POC 45.5 43.5 - 53.7 %   MCV 83.3 80 - 97 fL   MCH, POC 25.2 (A) 27 - 31.2 pg    MCHC 30.2 (A) 31.8 - 35.4 g/dL   RDW, POC 13.9 %   Platelet Count, POC 410 142 - 424 K/uL   MPV 7.1 0 - 99.8 fL  POCT Influenza A/B     Status: None   Collection Time: 12/29/14  7:11 PM  Result Value Ref Range   Influenza A, POC Negative    Influenza B, POC Negative    Assessment and Plan :   1. Myalgia 2. Sore throat 3. Chest tightness 4. History of pneumonia 5. Abnormal chest x-ray 6. Respiratory infection - Likely bacterial in etiology due to elevated wbc with left shift, symptoms; start doxycycline x10 days - Counseled on signs of worsening infection, advised to return to clinic or ED if this develops - Continue follow up with [facility] pending  Jaynee Eagles, PA-C [facility] [phone] 12/29/2014 6:31 PM

## 2014-12-30 LAB — BRAIN NATRIURETIC PEPTIDE: Brain Natriuretic Peptide: 5.6 pg/mL (ref 0.0–100.0)

## 2015-01-31 IMAGING — US US THORACENTESIS ASP PLEURAL SPACE W/IMG GUIDE
1 series · 4 of 4 positions shown · non-contrast
Comparison: none

CLINICAL DATA: Moderate right pleural effusion after recent RML
pneumonia, and started on azithro and pred. Today he states his
breathing maybe improved a slight amount for a bit but has continued
on for past 6 weeks and has been slightly worse the past 2 weeks. He
denies any SOB at rest. Denies IDK or orthopnea. He has been having
BLAJ with moderate exertion that has not improved since he was last
seen here. He states this actually goes back approx 5 years. Denies
any fever at home. Slight nonprod cough.
TECHNIQUE: THORACENTESIS WITH ULTRASOUND
TECHNIQUE: The procedure, risks (including but not limited to
bleeding, infection, organ damage ), benefits, and alternatives were
explained to the patient. Questions regarding the procedure were
encouraged and answered. The patient understands and consents to the
procedure. Survey ultrasound of the right hemithorax was performed
and an appropriate skin entry site was localized. Site was marked,
prepped with Betadine, draped in usual sterile fashion, infiltrated
locally with 1% lidocaine.

[Series 1: us thoracentesis asp pleural space w/img guide · 0.25mm/px · 4 of 4 slices shown]
[im 1/4]
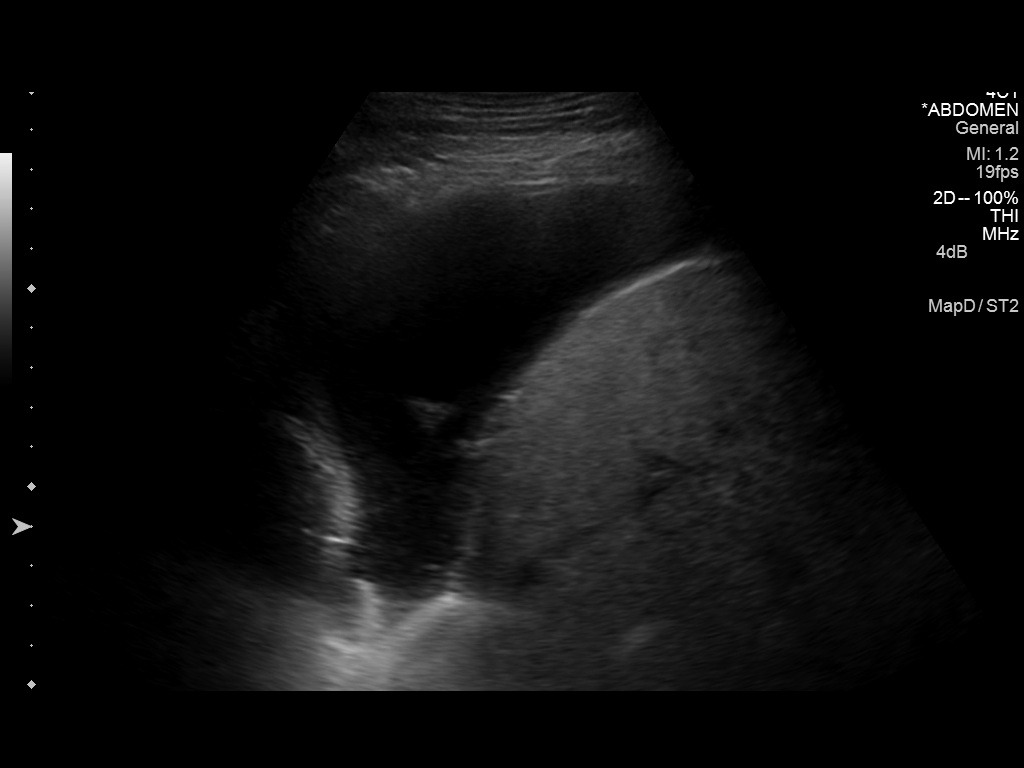
[im 2/4]
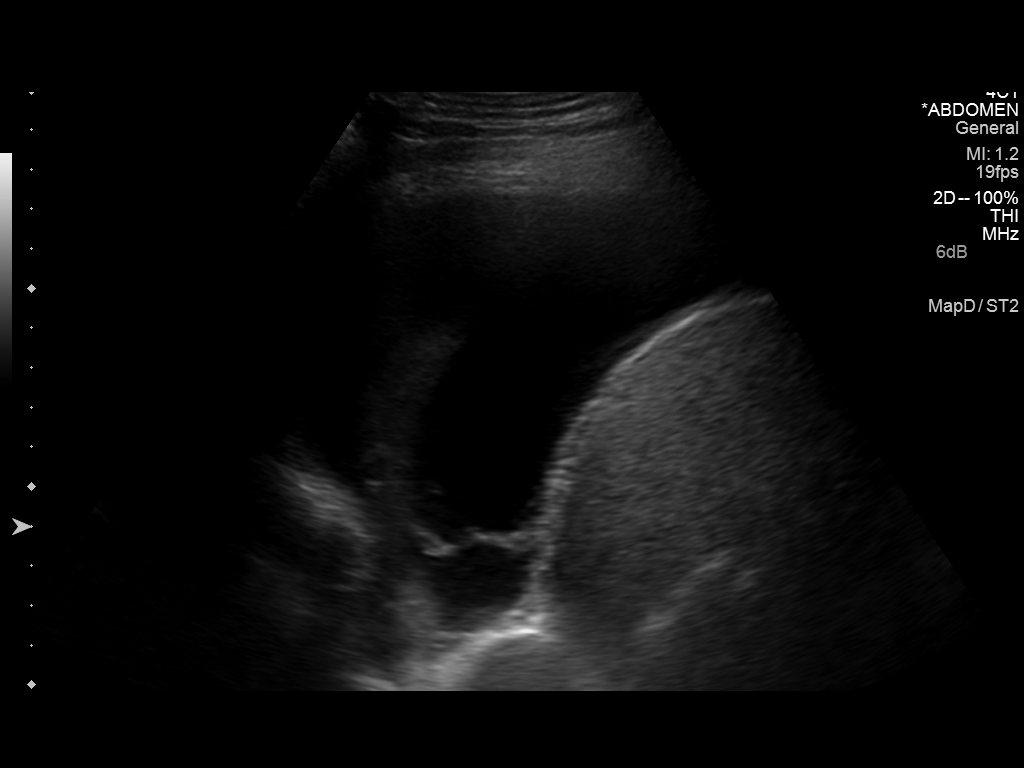
[im 3/4]
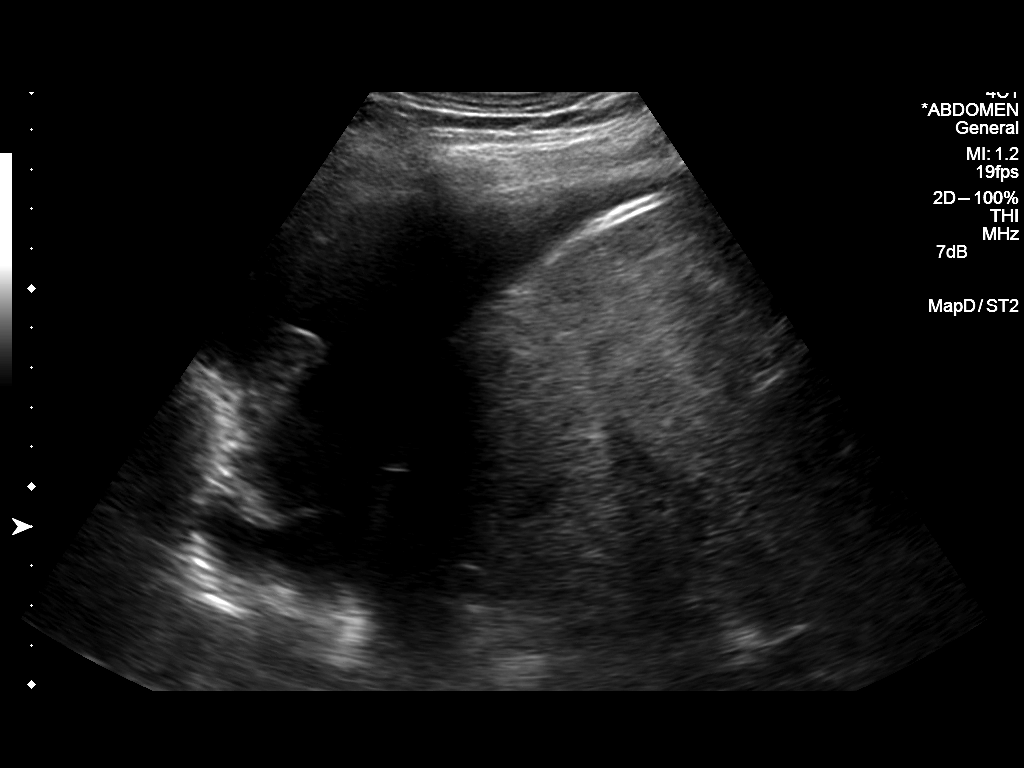
[im 4/4]
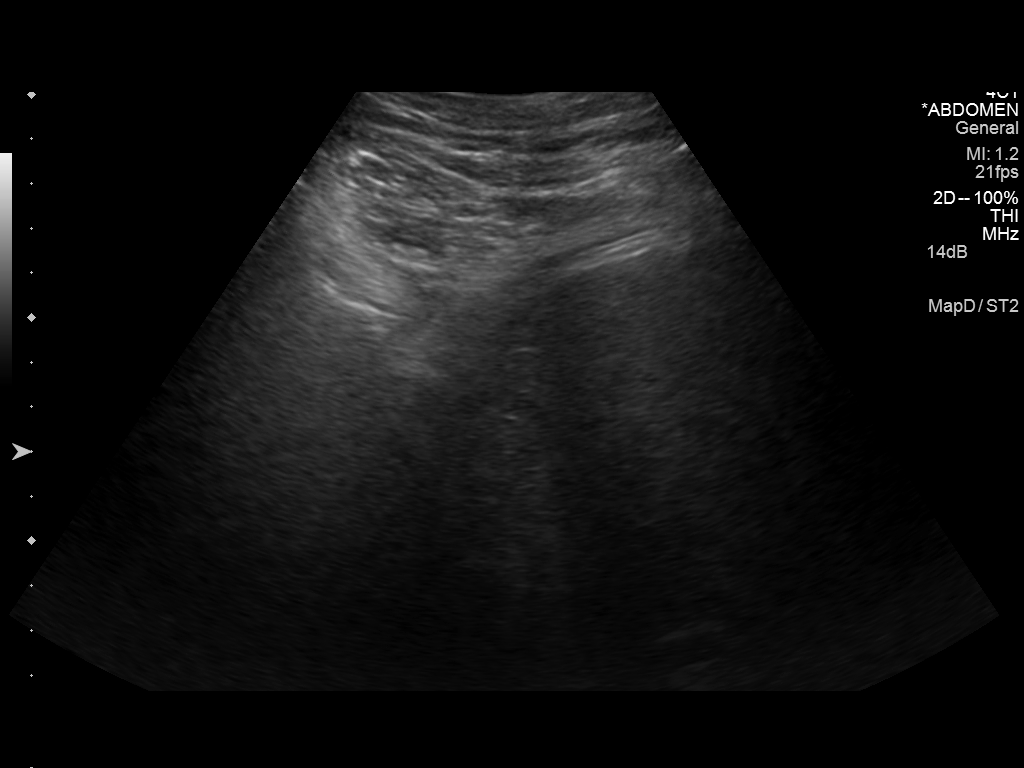

[4 of 4 positions shown; findings below may reference images not displayed]

The Safe-T-Centesis needle was advanced into the pleural space.
Slightly turbid amber colored fluid returned. 970 mL was removed.
Fluid sent for the requested laboratory studies. Post procedure
imaging shows no residual fluid. The patient experienced transient
vagal response of lightheadedness, mild hypertension, and
bradycardia, which resolved spontaneously with conservative support
of care.

COMPLICATIONS:
None immediate
IMPRESSION: Technically successful ultrasound-guided right thoracentesis.
Follow-up chest radiograph pending.

## 2015-03-29 ENCOUNTER — Other Ambulatory Visit (INDEPENDENT_AMBULATORY_CARE_PROVIDER_SITE_OTHER): Payer: Managed Care, Other (non HMO)

## 2015-03-29 ENCOUNTER — Ambulatory Visit (INDEPENDENT_AMBULATORY_CARE_PROVIDER_SITE_OTHER): Payer: Managed Care, Other (non HMO) | Admitting: Internal Medicine

## 2015-03-29 ENCOUNTER — Ambulatory Visit (INDEPENDENT_AMBULATORY_CARE_PROVIDER_SITE_OTHER)
Admission: RE | Admit: 2015-03-29 | Discharge: 2015-03-29 | Disposition: A | Discharge status: Home / Self Care | Payer: Worker's Compensation | Source: Ambulatory Visit | Attending: Internal Medicine | Admitting: Internal Medicine

## 2015-03-29 ENCOUNTER — Encounter: Payer: Self-pay | Admitting: Internal Medicine

## 2015-03-29 VITALS — BP 132/82 | HR 67 | Ht 67.0 in | Wt 178.0 lb

## 2015-03-29 DIAGNOSIS — R0600 Dyspnea, unspecified: Secondary | ICD-10-CM | POA: Diagnosis not present

## 2015-03-29 LAB — CBC WITH DIFFERENTIAL/PLATELET
Basophils Absolute: 0 10*3/uL (ref 0.0–0.1)
Basophils Relative: 0.3 % (ref 0.0–3.0)
Eosinophils Absolute: 0.3 10*3/uL (ref 0.0–0.7)
Eosinophils Relative: 3.4 % (ref 0.0–5.0)
HCT: 42.5 % (ref 39.0–52.0)
Hemoglobin: 14.1 g/dL (ref 13.0–17.0)
LYMPHS PCT: 17.9 % (ref 12.0–46.0)
Lymphs Abs: 1.6 10*3/uL (ref 0.7–4.0)
MCHC: 33.1 g/dL (ref 30.0–36.0)
MCV: 81.3 fl (ref 78.0–100.0)
MONOS PCT: 8.2 % (ref 3.0–12.0)
Monocytes Absolute: 0.7 10*3/uL (ref 0.1–1.0)
NEUTROS PCT: 70.2 % (ref 43.0–77.0)
Neutro Abs: 6.3 10*3/uL (ref 1.4–7.7)
PLATELETS: 413 10*3/uL — AB (ref 150.0–400.0)
RBC: 5.23 Mil/uL (ref 4.22–5.81)
RDW: 14.6 % (ref 11.5–15.5)
WBC: 8.9 10*3/uL (ref 4.0–10.5)

## 2015-03-29 LAB — SEDIMENTATION RATE: Sed Rate: 24 mm/hr — ABNORMAL HIGH (ref 0–22)

## 2015-03-29 LAB — BASIC METABOLIC PANEL
BUN: 12 mg/dL (ref 6–23)
CHLORIDE: 103 meq/L (ref 96–112)
CO2: 30 mEq/L (ref 19–32)
Calcium: 9.9 mg/dL (ref 8.4–10.5)
Creatinine, Ser: 1.08 mg/dL (ref 0.40–1.50)
GFR: 76.92 mL/min (ref >60.00)
GLUCOSE: 101 mg/dL — AB (ref 70–99)
Potassium: 4.3 mEq/L (ref 3.5–5.1)
SODIUM: 137 meq/L (ref 135–145)

## 2015-03-29 LAB — BRAIN NATRIURETIC PEPTIDE: PRO B NATRI PEPTIDE: 23 pg/mL (ref 0.0–100.0)

## 2015-03-29 MED ORDER — FAMOTIDINE 20 MG PO TABS: ORAL_TABLET | ORAL | Status: DC

## 2015-03-29 MED ORDER — PANTOPRAZOLE SODIUM 40 MG PO TBEC: 40.0000 mg | DELAYED_RELEASE_TABLET | Freq: Every day | ORAL | Status: DC

## 2015-03-29 NOTE — Patient Instructions (Signed)
Pantoprazole (protonix) 40 mg   Take  30-60 min before first meal of the day and Pepcid (famotidine)  20 mg one @  bedtime until return to office - this is the best way to tell whether stomach acid is contributing to your problem.    GERD (REFLUX)  is an extremely common cause of respiratory symptoms just like yours , many times with no obvious heartburn at all.    It can be treated with medication, but also with lifestyle changes including elevation of the head of your bed (ideally with 6 inch  bed blocks),  Smoking cessation, avoidance of late meals, excessive alcohol, and avoid fatty foods, chocolate, peppermint, colas, red wine, and acidic juices such as orange juice.  NO MINT OR MENTHOL PRODUCTS SO NO COUGH DROPS  USE SUGARLESS CANDY INSTEAD (Jolley ranchers or Stover's or Life Savers) or even ice chips will also do - the key is to swallow to prevent all throat clearing. NO OIL BASED VITAMINS - use powdered substitutes.    Please schedule a follow up office visit in 4 weeks, sooner if needed with pfts

## 2015-03-29 NOTE — Progress Notes (Signed)
Subjective:    Patient ID: [name] [name], male    DOB: Apr 02, 1966   MRN: [id number]    Brief patient profile:  14 yowm never really smoked at all told he asthma as child and could not play basketball in middle school due to sob but no w/u apparently done at that point, no sports in HS >  Did ok but since 2010 every time gets cold ends up in chest needs abx and prednisone and given inhalers and eventually advair rec  but did not take it as maint Jan 2012 dx pericarditis no surgery needed and resolved s surgery or cardiology eval then around early Oct 2015 experienced acute onset  coughing >>dark brown sputum and pain R chest worse lying down rx with abx then bilateral cp R > L and new cough with R pl effusion Tapped 09/10/14 x 970 ml with increased eos and referred to pulmonary clinic  09/13/2014 by Dr Everlene Farrier    History of Present Illness  09/13/2014 1st [facility] office visit/ Huong Luthi   Chief Complaint  Patient presents with  . Pulmonary Consult    Referred by Dr. Laney Pastor for eval of pleural effusion. Pt c/o DOE on and off "for a while" worse x 2 months. He also c/o non prod cough and occ CP with exertion.   Feeling better since tap with min residual cp with deep breath. 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 Follow-up    CXR done. Cough, DOE and chest discomfort are much improved. No new co's today.   Not limited by breathing from desired activities   rec Pneuomovax now and prevnar 13 in one year  Return here immediately if persistent cough, worse breathing or need rescue more than twice weekly    10/16/2014 acute   ov/Antino Mayabb re:  acute cough/ congestion 100% the same except having more hb after supper  Chief Complaint  Patient presents with  . Acute Visit    Pt c/o cough and congestion x 4 days. He is coughing up green to brown sputum.     rec Please remember to go to the lab and x-ray department downstairs for your tests - we will call you with the results when they are available.   Ok to take hycodan as needed but best non-sedating cough med =  delsym 2 tsp every 12 hours Pantoprazole (protonix) 40 mg   Take 30-60 min before first meal of the day and Pepcid 20 mg one bedtime until return to office - this is the best way to tell whether stomach acid is contributing to your problem.   GERD diet Please see patient coordinator before you leave today  to schedule sinus CT  Augmentin 875 mg take one pill twice daily  X 10 days - take at breakfast and supper with large glass of water.  It would help reduce the usual side effects (diarrhea and yeast infections) if you ate cultured yogurt at lunch.     11/22/2014 f/u ov/Gurtha Picker re: f/u effusion/ upper airway cough syndrome Chief  Complaint  Patient presents with  . Follow-up    Pt states that his breathing is doing well. No new co's today.   no real pattern to cough/ doesn't wake him but has sense of too much throat drainage better than it was protonix 30 min before helped some with daytime cough  but am's worse white mucus < a tsp  rec Pantoprazole (protonix) 40 mg   Take 30-60 min before first meal of the day and Pepcid 20 mg one bedtime until return to see Dr Laney Pastor  GERD diet  If not better during the next 3 months with the throat congestion see Red[facility] Baseman.     03/29/2015 f/u ov/Tod Abrahamsen re: new problem of chest tightness/ sob  Chief Complaint  Patient presents with  . Acute Visit    Pt c/o tightness in chest for the past month- worse when he exerts himself or takes a deep breath. Hurts when he lies down. He states his ribs feel painful to touch at times.     100% better p  last ov/ stopped the rx for acid w/in a month then gradual onset sob walking to mb = 50 ft / assoc with sensation of tight chest bilaterally  Worse in hot weather- chest discomfort is difffuse but worse on L anteriorly  No obvious day to day or daytime variabilty or assoc cough  or   subjective wheeze overt sinus or hb symptoms. No unusual exp hx or h/o childhood pna/ asthma or knowledge of premature birth.  Sleeping ok without nocturnal  or early am exacerbation  of respiratory  c/o's or need for noct saba. Also denies any obvious fluctuation of symptoms with weather or environmental changes or other aggravating or alleviating factors except as outlined above   Current Medications, Allergies, Complete Past Medical History, Past Surgical History, Family History, and Social History were reviewed in [employer] record.  ROS  The following are not active complaints unless bolded sore throat, dysphagia, dental problems, itching, sneezing,  nasal congestion or excess/ purulent secretions, ear ache,   fever, chills, sweats, unintended wt loss, classically   exertional cp, hemoptysis,  orthopnea pnd or leg swelling, presyncope, palpitations, heartburn, abdominal pain, anorexia, nausea, vomiting, diarrhea  or change in bowel or urinary habits, change in stools or urine, dysuria,hematuria,  rash, arthralgias, visual complaints, headache, numbness weakness or ataxia or problems with walking or coordination,  change in mood/affect or memory.                 Objective:   Physical Exam  amb wm nad   03/29/2015         178  Wt Readings from Last 3 Encounters:  11/22/14 183 lb (83.008 kg)  11/21/14 175 lb (79.379 kg)  10/16/14 190 lb (86.183 kg)    Vital signs reviewed   HEENT: nl dentition, turbinates, and orophanx. Nl external ear canals without cough reflex   NECK :  without JVD/Nodes/TM/ nl carotid upstrokes bilaterally   LUNGS: no acc muscle use, minimal decrease bs  at R base   CV:  RRR  no s3 or murmur or increase in P2, no edema   ABD:  soft and nontender with nl excursion in the supine position. No bruits or organomegaly, bowel sounds nl  MS:  warm without deformities, calf tenderness, cyanosis or clubbing  SKIN: warm and dry without lesions    NEURO:  alert, approp, no deficits      CXR PA and Lateral:  03/29/2015 :     I personally reviewed images and agree with radiology impression as follows:   1. Low lung volumes with mild bibasilar subsegmental atelectasis and/or infiltrates. 2. Stable mild right pleural thickening consistent with scarring and/or tiny effusion    Labs ordered/ reviewed:    Lab 03/29/15 1648  NA 137  K 4.3  CL 103  CO2 30  BUN 12  CREATININE 1.08  GLUCOSE 101*     Lab 03/29/15 1648  HGB 14.1  HCT 42.5  WBC 8.9  PLT 413.0*     Lab Results  Component Value Date   TSH 1.031 11/15/2013     Lab Results  Component Value Date   PROBNP 23.0 03/29/2015     Lab Results  Component Value Date   ESRSEDRATE 24* 03/29/2015   POCTSEDRATE 34* 09/09/2014        Assessment & Plan:

## 2015-03-30 ENCOUNTER — Telehealth: Payer: Self-pay | Admitting: Internal Medicine

## 2015-03-30 NOTE — Telephone Encounter (Signed)
Left detailed message on machine per patient request with lab and cxr results. Nothing further needed.  Notes Recorded by Rosana Berger, CMA on 03/30/2015 at 9:55 AM LMTCB Notes Recorded by Tanda Rockers, MD on 03/29/2015 at 6:46 PM Call patient : Studies are unremarkable, no change in recs Notes Recorded by Tanda Rockers, MD on 03/30/2015 at 8:40 AM Call pt: Reviewed cxr and no acute change so no change in recommendations made at [facility]

## 2015-03-30 NOTE — Progress Notes (Signed)
Quick Note:  LMTCB ______ 

## 2015-03-31 ENCOUNTER — Encounter: Payer: Self-pay | Admitting: Internal Medicine

## 2015-03-31 NOTE — Assessment & Plan Note (Addendum)
-   03/29/2015  Walked RA x 3 laps @ 185 ft each stopped due to  End of study, no desat, fast pace, no sob   Unable to reproduce this problem in the office and symptoms seem  disproportionate to objective findings and not clear this is a lung problem but pt does appear to have difficult airway management issues. DDX of  difficult airways management all start with A and  include Adherence, Ace Inhibitors, Acid Reflux, Active Sinus Disease, Alpha 1 Antitripsin deficiency, Anxiety masquerading as Airways dz,  ABPA,  allergy(esp in young), Aspiration (esp in elderly), Adverse effects of meds,  Active smokers, A bunch of PE's (a small clot burden can't cause this syndrome unless there is already severe underlying pulm or vascular dz with poor reserve) plus two Bs  = Bronchiectasis and Beta blocker use..and one C= CHF  ? Acid (or non-acid) GERD > always difficult to exclude as up to 75% of pts in some series report no assoc GI/ Heartburn symptoms> rec max (24h)  acid suppression and diet restrictions/ reviewed and instructions given in writing.   ? Allergy/ no evidence of recurrent eosinophilia or pleural effusion/ prev allergy profile neg   ? Anxiety > dx of exclusion   ? A bunch of Pe's (one or two not enough for this syndrome and his rapid walk test is neg)  ? chf > excluded by low bnp / pain is not exertional  rec restart GERD rx as this all started p he stopped rx / return for full pfts

## 2015-05-01 ENCOUNTER — Ambulatory Visit (INDEPENDENT_AMBULATORY_CARE_PROVIDER_SITE_OTHER): Payer: Managed Care, Other (non HMO) | Admitting: Internal Medicine

## 2015-05-01 ENCOUNTER — Encounter: Payer: Self-pay | Admitting: Internal Medicine

## 2015-05-01 VITALS — BP 112/74 | HR 58 | Ht 68.0 in | Wt 179.0 lb

## 2015-05-01 DIAGNOSIS — J90 Pleural effusion, not elsewhere classified: Secondary | ICD-10-CM

## 2015-05-01 DIAGNOSIS — J948 Other specified pleural conditions: Secondary | ICD-10-CM | POA: Diagnosis not present

## 2015-05-01 DIAGNOSIS — R0600 Dyspnea, unspecified: Secondary | ICD-10-CM

## 2015-05-01 DIAGNOSIS — R05 Cough: Secondary | ICD-10-CM

## 2015-05-01 DIAGNOSIS — R058 Other specified cough: Secondary | ICD-10-CM

## 2015-05-01 LAB — PULMONARY FUNCTION TEST
DL/VA % PRED: 120 %
DL/VA: 5.45 ml/min/mmHg/L
DLCO UNC % PRED: 74 %
DLCO unc: 22.11 ml/min/mmHg
FEF 25-75 PRE: 1.51 L/s
FEF 25-75 Post: 2.07 L/sec
FEF2575-%Change-Post: 37 %
FEF2575-%Pred-Post: 63 %
FEF2575-%Pred-Pre: 46 %
FEV1-%Change-Post: 7 %
FEV1-%Pred-Post: 63 %
FEV1-%Pred-Pre: 59 %
FEV1-PRE: 2.17 L
FEV1-Post: 2.33 L
FEV1FVC-%CHANGE-POST: 5 %
FEV1FVC-%Pred-Pre: 94 %
FEV6-%Change-Post: 1 %
FEV6-%PRED-PRE: 64 %
FEV6-%Pred-Post: 65 %
FEV6-PRE: 2.93 L
FEV6-Post: 2.98 L
FEV6FVC-%PRED-POST: 104 %
FEV6FVC-%Pred-Pre: 104 %
FVC-%Change-Post: 1 %
FVC-%PRED-PRE: 62 %
FVC-%Pred-Post: 63 %
FVC-Post: 2.98 L
FVC-Pre: 2.93 L
POST FEV6/FVC RATIO: 100 %
PRE FEV1/FVC RATIO: 74 %
PRE FEV6/FVC RATIO: 100 %
Post FEV1/FVC ratio: 78 %
RV % pred: 77 %
RV: 1.5 L
TLC % PRED: 68 %
TLC: 4.46 L

## 2015-05-01 NOTE — Progress Notes (Signed)
PFT done today. 

## 2015-05-01 NOTE — Patient Instructions (Signed)
Your lung function is excellent considering your recent problems with pneumonia/ fluid collection in R chest  To get the most out of exercise, you need to be continuously aware that you are short of breath, but never out of breath, for 30 minutes three times a week . As you improve, it will actually be easier for you to do the same amount of exercise  in  30 minutes so always push to the level where you are short of breath.   If not improving after at least 2 weeks >  Call [facility] to set a cpst  [phone]   Stay on acid program for a full 3 months then discuss with Laney Pastor

## 2015-05-01 NOTE — Progress Notes (Signed)
Subjective:    Patient ID: [name] [name], male    DOB: December 07, 1965   MRN: [id number]    Brief patient profile:  14 yowm never really smoked at all told he asthma as child and could not play basketball in middle school due to sob but no w/u apparently done at that point, no sports in HS >  Did ok but since 2010 every time gets cold ends up in chest needs abx and prednisone and given inhalers and eventually advair rec  but did not take it as maint Jan 2012 dx pericarditis no surgery needed and resolved s surgery or cardiology eval then around early Oct 2015 experienced acute onset  coughing >>dark brown sputum and pain R chest worse lying down rx with abx then bilateral cp R > L and new cough with R pl effusion Tapped 09/10/14 x 970 ml with increased eos and referred to pulmonary clinic  09/13/2014 by Dr Everlene Farrier    History of Present Illness  09/13/2014 1st [facility] office visit/ Severo Beber   Chief Complaint  Patient presents with  . Pulmonary Consult    Referred by Dr. Laney Pastor for eval of pleural effusion. Pt c/o DOE on and off "for a while" worse x 2 months. He also c/o non prod cough and occ CP with exertion.   Feeling better since tap with min residual cp with deep breath. 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 Follow-up    CXR done. Cough, DOE and chest discomfort are much improved. No new co's today.   Not limited by breathing from desired activities   rec Pneuomovax now and prevnar 13 in one year  Return here immediately if persistent cough, worse breathing or need rescue more than twice weekly    10/16/2014 acute   ov/Stormie Ventola re:  acute cough/ congestion 100% the same except having more hb after supper  Chief Complaint  Patient presents with  . Acute Visit    Pt c/o cough and congestion x 4 days. He is coughing up green to brown sputum.     rec Please remember to go to the lab and x-ray department downstairs for your tests - we will call you with the results when they are available.  Ok to take hycodan as needed but best non-sedating cough med =  delsym 2 tsp every 12 hours Pantoprazole (protonix) 40 mg   Take 30-60 min before first meal of the day and Pepcid 20 mg one bedtime until return to office - this is the best way to tell whether stomach acid is contributing to your problem.   GERD diet Please see patient coordinator before you leave today  to schedule sinus CT  Augmentin 875 mg take one pill twice daily  X 10 days    11/22/2014 f/u ov/Andreana Klingerman re: f/u effusion/ upper airway cough syndrome Chief Complaint  Patient presents with  . Follow-up    Pt states that his breathing is doing well. No new co's today.   no real pattern to cough/ doesn't wake him  but has sense of too much throat drainage better than it was protonix 30 min before helped some with daytime cough  but am's worse white mucus < a tsp  rec Pantoprazole (protonix) 40 mg   Take 30-60 min before first meal of the day and Pepcid 20 mg one bedtime until return to see Dr Laney Pastor  GERD diet  If not better during the next 3 months with the throat congestion see Red[facility] Baseman.     03/29/2015 f/u ov/Jenna Ardoin re: new problem of chest tightness/ sob  Chief Complaint  Patient presents with  . Acute Visit    Pt c/o tightness in chest for the past month- worse when he exerts himself or takes a deep breath. Hurts when he lies down. He states his ribs feel painful to touch at times.   100% better p last ov/ stopped the rx for acid w/in a month then gradual onset sob walking to mb = 50 ft / assoc with sensation of tight chest bilaterally  Worse in hot weather- chest  discomfort is difffuse but worse on L anteriorly rec Pantoprazole (protonix) 40 mg   Take  30-60 min before first meal of the day and Pepcid (famotidine)  20 mg one @  bedtime until return to office - this is the best way to tell whether stomach acid is contributing to your problem.   GERD diet      05/01/2015 f/u ov/Staria Birkhead re:  F/u effusion / doe with cp better on gerd rx  Chief Complaint  Patient presents with  . Follow-up    PFT done today.  Breathing has improved some. He has not had anymore "burning in chest". No new co's today.   no more cp with exercise/ walking around flat surface/ no aerobics  Prior to illness could do treadmill 4 mph slt include 20 min / has not tried it since oct 2015 "just gets ex walking around on his job"   No obvious day to day or daytime variabilty or assoc cough  or   subjective wheeze overt sinus or hb symptoms. No unusual exp hx or h/o childhood pna/ asthma or knowledge of premature birth.  Sleeping ok without nocturnal  or early am exacerbation  of respiratory  c/o's or need for noct saba. Also denies any obvious fluctuation of symptoms with weather or environmental changes or other aggravating or alleviating factors except as outlined above   Current Medications, Allergies, Complete Past Medical History, Past Surgical History, Family History, and Social History were reviewed in [employer] record.  ROS  The following are not active complaints unless bolded sore throat, dysphagia, dental problems, itching, sneezing,  nasal congestion or excess/ purulent secretions, ear ache,   fever, chills, sweats, unintended wt loss, classically   exertional cp, hemoptysis,  orthopnea pnd or leg swelling, presyncope, palpitations, heartburn, abdominal pain, anorexia, nausea, vomiting, diarrhea  or change in bowel or urinary habits, change in stools or urine, dysuria,hematuria,  rash, arthralgias, visual complaints, headache, numbness weakness or ataxia  or problems with walking or coordination,  change in mood/affect or memory.                 Objective:   Physical Exam  amb wm nad   03/29/2015         178 >  05/01/2015  179  Wt Readings from Last 3 Encounters:  11/22/14 183 lb (83.008 kg)  11/21/14 175 lb (79.379 kg)  10/16/14 190 lb (86.183 kg)  Vital signs reviewed   HEENT: nl dentition, turbinates, and orophanx. Nl external ear canals without cough reflex   NECK :  without JVD/Nodes/TM/ nl carotid upstrokes bilaterally   LUNGS: no acc muscle use, minimal decrease bs at R base   CV:  RRR  no s3 or murmur or increase in P2, no edema   ABD:  soft and nontender with nl excursion in the supine position. No bruits or organomegaly, bowel sounds nl  MS:  warm without deformities, calf tenderness, cyanosis or clubbing  SKIN: warm and dry without lesions    NEURO:  alert, approp, no deficits      CXR PA and Lateral:   03/29/2015 :     I personally reviewed images and agree with radiology impression as follows:   1. Low lung volumes with mild bibasilar subsegmental atelectasis and/or infiltrates. 2. Stable mild right pleural thickening consistent with scarring and/or tiny effusion    Labs ordered/ reviewed:    Lab 03/29/15 1648  NA 137  K 4.3  CL 103  CO2 30  BUN 12  CREATININE 1.08  GLUCOSE 101*     Lab 03/29/15 1648  HGB 14.1  HCT 42.5  WBC 8.9  PLT 413.0*     Lab Results  Component Value Date   TSH 1.031 11/15/2013     Lab Results  Component Value Date   PROBNP 23.0 03/29/2015     Lab Results  Component Value Date   ESRSEDRATE 24* 03/29/2015   POCTSEDRATE 34* 09/09/2014        Assessment & Plan:

## 2015-05-02 ENCOUNTER — Encounter: Payer: Self-pay | Admitting: Internal Medicine

## 2015-05-02 NOTE — Assessment & Plan Note (Signed)
-   Added flutter valve to rx 08/02/14  - ent referral 08/14/2014 > [facility] eval c/w severe fungal laryngitis > improved on diflucan - allergy profile 08/14/14  >  IgE 39 with neg RAST Sinus ct 10/17/2014 > Minimal mucosal thickening RIGHT maxillary sinus. No additional sinus abnormalities identified.  This and cp resolved on ppi/h2hs so should continue for at least 3 months then see Dr Laney Pastor and consider taper vs GI referral if can't taper.

## 2015-05-02 NOTE — Assessment & Plan Note (Signed)
-    R Tap x 970 ml x 09/10/14 exudative with wbc 3722 27% eos  Vs 8% peripherally on 09/13/14  And nl glucose/ ph  - ESR 09/13/2014  46 > rx Prednisone 10 mg take  4 each am x 2 days,   2 each am x 2 days,  1 each am x 2 days and stop > improved  -  10/16/14 > small reaccumulation with esr only 21 and eos 6% > quantiferon gold neg  - CXR  03/29/15 s significant reaccumulation > no futher f/u needed

## 2015-05-02 NOTE — Assessment & Plan Note (Signed)
-   03/29/2015  Walked RA x 3 laps @ 185 ft each stopped due to  End of study, no desat, fast pace, no sob  - PFTs 05/01/15  VC 2.96  (62%) s obst and erv 57   I had an extended final summary discussion with the patient reviewing all relevant studies completed to date and  lasting 15 to 20 minutes of a 25 minute visit on the following issues:    1) he does have some loss of lung vol related to pna/ effusion/ mild obesity but I think his main problem at his point is deconditioning  2) reconditioning reviewed, needs cpst if not making progress  3) Each maintenance medication was reviewed in detail including most importantly the difference between maintenance and as needed and under what circumstances the prns are to be used.  Please see instructions for details which were reviewed in writing and the patient given a copy.

## 2015-06-25 ENCOUNTER — Other Ambulatory Visit: Payer: Self-pay | Admitting: Internal Medicine

## 2015-07-01 ENCOUNTER — Other Ambulatory Visit: Payer: Self-pay | Admitting: Internal Medicine

## 2015-08-29 ENCOUNTER — Encounter: Payer: Self-pay | Admitting: Internal Medicine

## 2015-08-29 ENCOUNTER — Ambulatory Visit (INDEPENDENT_AMBULATORY_CARE_PROVIDER_SITE_OTHER): Payer: Managed Care, Other (non HMO) | Admitting: Physician Assistant

## 2015-08-29 VITALS — BP 142/75 | HR 54 | Temp 97.7°F | Resp 16 | Ht 68.0 in | Wt 179.0 lb

## 2015-08-29 DIAGNOSIS — Z23 Encounter for immunization: Secondary | ICD-10-CM | POA: Diagnosis not present

## 2015-08-29 DIAGNOSIS — Z139 Encounter for screening, unspecified: Secondary | ICD-10-CM

## 2015-08-29 DIAGNOSIS — Z0000 Encounter for general adult medical examination without abnormal findings: Secondary | ICD-10-CM | POA: Diagnosis not present

## 2015-08-29 DIAGNOSIS — Z1211 Encounter for screening for malignant neoplasm of colon: Secondary | ICD-10-CM

## 2015-08-29 DIAGNOSIS — R030 Elevated blood-pressure reading, without diagnosis of hypertension: Secondary | ICD-10-CM | POA: Diagnosis not present

## 2015-08-29 DIAGNOSIS — Z8701 Personal history of pneumonia (recurrent): Secondary | ICD-10-CM

## 2015-08-29 DIAGNOSIS — IMO0001 Reserved for inherently not codable concepts without codable children: Secondary | ICD-10-CM

## 2015-08-29 LAB — LIPID PANEL
CHOL/HDL RATIO: 4 ratio (ref <=5.0)
CHOLESTEROL: 192 mg/dL (ref 125–200)
HDL: 48 mg/dL (ref >=40)
LDL CALC: 132 mg/dL — AB (ref <130)
TRIGLYCERIDES: 62 mg/dL (ref <150)
VLDL: 12 mg/dL (ref <30)

## 2015-08-29 LAB — COMPLETE METABOLIC PANEL WITH GFR
ALT: 17 U/L (ref 9–46)
AST: 12 U/L (ref 10–35)
Albumin: 4 g/dL (ref 3.6–5.1)
Alkaline Phosphatase: 97 U/L (ref 40–115)
BUN: 13 mg/dL (ref 7–25)
CALCIUM: 9.8 mg/dL (ref 8.6–10.3)
CHLORIDE: 100 mmol/L (ref 98–110)
CO2: 27 mmol/L (ref 20–31)
CREATININE: 0.9 mg/dL (ref 0.70–1.33)
GFR, Est African American: >89 mL/min (ref >=60)
GFR, Est Non African American: >89 mL/min (ref >=60)
Glucose, Bld: 110 mg/dL — ABNORMAL HIGH (ref 65–99)
POTASSIUM: 5.3 mmol/L (ref 3.5–5.3)
Sodium: 138 mmol/L (ref 135–146)
Total Bilirubin: 0.6 mg/dL (ref 0.2–1.2)
Total Protein: 7.2 g/dL (ref 6.1–8.1)

## 2015-08-29 LAB — CBC WITH DIFFERENTIAL/PLATELET
Basophils Absolute: 0 10*3/uL (ref 0.0–0.1)
Basophils Relative: 0 % (ref 0–1)
Eosinophils Absolute: 0.1 10*3/uL (ref 0.0–0.7)
Eosinophils Relative: 2 % (ref 0–5)
HCT: 40.2 % (ref 39.0–52.0)
HEMOGLOBIN: 13.8 g/dL (ref 13.0–17.0)
LYMPHS ABS: 1.3 10*3/uL (ref 0.7–4.0)
LYMPHS PCT: 19 % (ref 12–46)
MCH: 27.5 pg (ref 26.0–34.0)
MCHC: 34.3 g/dL (ref 30.0–36.0)
MCV: 80.2 fL (ref 78.0–100.0)
MONO ABS: 0.6 10*3/uL (ref 0.1–1.0)
MONOS PCT: 9 % (ref 3–12)
MPV: 9 fL (ref 8.6–12.4)
NEUTROS ABS: 5 10*3/uL (ref 1.7–7.7)
Neutrophils Relative %: 70 % (ref 43–77)
PLATELETS: 443 10*3/uL — AB (ref 150–400)
RBC: 5.01 MIL/uL (ref 4.22–5.81)
RDW: 13.8 % (ref 11.5–15.5)
WBC: 7.1 10*3/uL (ref 4.0–10.5)

## 2015-08-29 LAB — HEMOGLOBIN A1C: Hgb A1c MFr Bld: 6.8 % — AB (ref 4.0–6.0)

## 2015-08-29 LAB — POCT GLYCOSYLATED HEMOGLOBIN (HGB A1C): HEMOGLOBIN A1C: 6.8

## 2015-08-29 NOTE — Patient Instructions (Addendum)
Date Time BP  11/16 3:30 142/75

## 2015-08-29 NOTE — Progress Notes (Signed)
08/29/2015 3:26 PM   DOB: Nov 26, 1965 / MRN: [id number]  SUBJECTIVE:  [name] [name] is a 49 y.o. male presenting for an annual physical.  He has never had a colonoscopy.  He dose not exercise but tries to walk at work.  He is a never smoker, denies alcohol and illicit drugs.  He does take ASA daily. Denies CAD in his parents and brothers and sisters. No family history of prostate cancer.  No HIV screen in [facility]. Has a wife and two children, denies anhedonia and depression.    Reports that his daughter, who is in nursing school, has been monitoring his BP and has been getting onto him because his BP is running 140/80-90. He denies chest pain and DOE.     Had a bout of walking pneumonia in late 2105 that led to a hospitalization and pleural effusion which was drained.  He was immunized at that time with the PCV23 and would like a booster today.   Depression screen PHQ 2/9 08/29/2015  Decreased Interest 0  Down, Depressed, Hopeless 0  PHQ - 2 Score 0   Immunization History  Administered Date(s) Administered  . Influenza Split 07/13/2014, 08/07/2015  . Pneumococcal Polysaccharide-23 09/22/2014  . Tdap 08/29/2015     He has No Known Allergies.   He  has a past medical history of Allergy; Asthma; Heart murmur; Arthritis; Kidney stones; Pericarditis; and Pneumonia.    He  reports that he has never smoked. He has never used smokeless tobacco. He reports that he does not drink alcohol or use illicit drugs. He  reports that he currently engages in sexual activity. The patient  has past surgical history that includes Hernia repair.  His family history includes Asthma in his mother; Cancer in his father and mother; Diabetes in his father, maternal grandmother, and mother; Heart disease in his maternal grandfather and mother; Kidney disease in his father; Lung cancer in his mother; Stroke in his daughter.  Review of Systems  Constitutional: Negative for fever and chills.  Respiratory: Negative for  shortness of breath.   Cardiovascular: Negative for chest pain.  Gastrointestinal: Negative for nausea and abdominal pain.  Genitourinary: Negative.   Skin: Negative for rash.  Neurological: Negative for dizziness and headaches.    Problem list and medications reviewed and updated by myself where necessary, and exist elsewhere in the encounter.   OBJECTIVE:  BP 142/75 mmHg  Pulse 54  Temp(Src) 97.7 F (36.5 C)  Resp 16  Ht 5\' 8"  (1.727 m)  Wt 179 lb (81.194 kg)  BMI 27.22 kg/m2 CrCl cannot be calculated (Patient has no serum creatinine result on file.).  Physical Exam  Constitutional: He is oriented to person, place, and time. He appears well-developed and well-nourished.  HENT:  Head: Normocephalic.  Eyes: Conjunctivae are normal.  Neck: Normal range of motion. Neck supple.  Cardiovascular: Normal rate, regular rhythm, normal heart sounds and intact distal pulses.   Pulmonary/Chest: Effort normal and breath sounds normal.  Abdominal: Soft. Bowel sounds are normal.  Musculoskeletal: Normal range of motion.  Neurological: He is alert and oriented to person, place, and time. He has normal reflexes.  Skin: Skin is warm and dry.  Psychiatric: He has a normal mood and affect. His behavior is normal. Judgment and thought content normal.    Visual Acuity Screening   Right eye Left eye Both eyes  Without correction:     With correction: 20/20 20/20 20/15      No results found for this  or any previous visit (from the past 48 hour(s)).  ASSESSMENT AND PLAN  [name] [name] was seen today for annual exam.  Diagnoses and all orders for this visit:  Physical exam: He appears to be doing well.  Will screen.  Will send to GI for first colonoscopy.  Prevnar given today given recent history of bacterial pneumonia.  He has already received the 23.  Last lipid largely normal. Will further evaluate for the need of ASA 81 daily pending labs.    Need for Tdap vaccination -     Tdap vaccine  greater than or equal to 7yo IM  Special screening for malignant neoplasms, colon -     Ambulatory referral to Gastroenterology  Screening -     CBC with Differential/Platelet -     TSH -     POCT glycosylated hemoglobin (Hb A1C) -     COMPLETE METABOLIC PANEL WITH GFR -     Lipid panel -     PSA -     HIV antibody  History of pneumonia -     Pneumococcal conjugate vaccine 13-valent IM  Elevated blood pressure: Advised patient to monitor and record for one month and return this to me. Log Provided via avs.     The patient was advised to call or return to clinic if he does not see an improvement in symptoms or to seek the care of the closest emergency department if he worsens with the above plan.   Philis Fendt, MHS, PA-C [facility] 08/29/2015 3:26 PM

## 2015-08-30 ENCOUNTER — Encounter: Payer: Self-pay | Admitting: Internal Medicine

## 2015-08-30 LAB — HIV ANTIBODY (ROUTINE TESTING W REFLEX): HIV: NONREACTIVE

## 2015-08-30 LAB — TSH: TSH: 1.028 u[IU]/mL (ref 0.350–4.500)

## 2015-08-30 LAB — PSA: PSA: 0.83 ng/mL (ref <=4.00)

## 2015-09-05 ENCOUNTER — Encounter: Payer: Self-pay | Admitting: Physician Assistant

## 2015-09-05 ENCOUNTER — Ambulatory Visit (INDEPENDENT_AMBULATORY_CARE_PROVIDER_SITE_OTHER): Payer: Managed Care, Other (non HMO) | Admitting: Physician Assistant

## 2015-09-05 VITALS — BP 130/83 | HR 75 | Temp 98.1°F | Resp 16 | Ht 68.0 in | Wt 175.0 lb

## 2015-09-05 DIAGNOSIS — E119 Type 2 diabetes mellitus without complications: Secondary | ICD-10-CM

## 2015-09-05 MED ORDER — LISINOPRIL 10 MG PO TABS: 10.0000 mg | ORAL_TABLET | Freq: Every day | ORAL | Status: DC

## 2015-09-05 NOTE — Progress Notes (Signed)
09/07/2015 9:14 AM   DOB: 15-Nov-1965 / MRN: [id number]  SUBJECTIVE:  [name] [name] is a 49 y.o. male presenting for follow up of an elevated A1C measured at 6.8 seven days ago. He has no known history of diabetes previous to this measure.  He has received both the Prevnar and Pneumovax already do a pneumonia that occurred roughly 1 year ago which lead to a hospital stay.  He denies sock and glove paresthesia and distal limb pain. Reports nocturia x 1 and no change in this.  No erectile dysfunction.  Last lipid are included in this note.   Denies CP, SOB, new DOE and vision changes.     He has No Known Allergies.   He  has a past medical history of Allergy; Asthma; Heart murmur; Arthritis; Kidney stones; Pericarditis; and Pneumonia.    He  reports that he has never smoked. He has never used smokeless tobacco. He reports that he does not drink alcohol or use illicit drugs. He  reports that he currently engages in sexual activity. The patient  has past surgical history that includes Hernia repair.  His family history includes Asthma in his mother; Cancer in his father and mother; Diabetes in his father, maternal grandmother, and mother; Heart disease in his maternal grandfather and mother; Kidney disease in his father; Lung cancer in his mother; Stroke in his daughter.  Review of Systems  Constitutional: Negative for fever and chills.  Respiratory: Negative for shortness of breath.   Cardiovascular: Negative for chest pain.  Gastrointestinal: Negative for nausea and abdominal pain.  Genitourinary: Negative.   Skin: Negative for rash.  Neurological: Negative for dizziness and headaches.   Immunization History  Administered Date(s) Administered  . Influenza Split 07/13/2014, 08/07/2015  . Pneumococcal Conjugate-13 08/29/2015  . Pneumococcal Polysaccharide-23 09/22/2014  . Tdap 08/29/2015   Problem list and medications reviewed and updated by myself where necessary, and exist elsewhere in  the encounter.   OBJECTIVE:  BP 130/83 mmHg  Pulse 75  Temp(Src) 98.1 F (36.7 C) (Oral)  Resp 16  Ht 5\' 8"  (1.727 m)  Wt 175 lb (79.379 kg)  BMI 26.61 kg/m2  SpO2 97% Estimated Creatinine Clearance: 95 mL/min (by C-G formula based on Cr of 0.9).  Physical Exam  Constitutional: He is oriented to person, place, and time. He appears well-developed and well-nourished.  HENT:  Head: Normocephalic.  Eyes: Conjunctivae are normal.  Neck: Normal range of motion. Neck supple.  Cardiovascular: Normal rate, regular rhythm, normal heart sounds and intact distal pulses.   Pulmonary/Chest: Effort normal and breath sounds normal.  Abdominal: Soft. Bowel sounds are normal.  Musculoskeletal: Normal range of motion.  Neurological: He is alert and oriented to person, place, and time. He has normal reflexes.  Skin: Skin is warm and dry.  Psychiatric: He has a normal mood and affect. His behavior is normal. Judgment and thought content normal.   Lab Results  Component Value Date   CHOL 192 08/29/2015   HDL 48 08/29/2015   LDLCALC 132* 08/29/2015   TRIG 62 08/29/2015   CHOLHDL 4.0 08/29/2015      Results for orders placed or performed in visit on 09/05/15 (from the past 48 hour(s))  Microalbumin, urine     Status: None   Collection Time: 09/05/15  3:24 PM  Result Value Ref Range   Microalb, Ur 0.3 Not estab mg/dL    Comment: The ADA has defined abnormalities in albumin excretion as follows:  Category           Result                            (mcg/mg creatinine)                 Normal:    <30       Microalbuminuria:    30 - 299   Clinical albuminuria:    > or = 300   The ADA recommends that at least two of three specimens collected within a 3 - 6 month period be abnormal before considering a patient to be within a diagnostic category.       ASSESSMENT AND PLAN  [name] [name] was seen today for elevated a1c.  Diagnoses and all orders for this visit:  Controlled type 2  diabetes mellitus without complication, without long-term current use of insulin [facility]):  Patient plans to cut out sugared beverages and eat a more prudent diet.  Plans to start a formal exercise program at 3-4 days per week at 30 minutes per session. Patient unwilling to start Metformin at this time. He does not want to attend diabetes education but will consider this if his numbers do not improve. ASCVD risk at 7.8 and  4.1 if he were not a diabetic. He is willing to start Lisinopril today for borderline HTN and overall risk reduction.  Will bring him back in three months for a recheck. If his risk remains elevated will push to start statin and metformin.    -     Microalbumin, urine  Other orders -     lisinopril (PRINIVIL) 10 MG tablet; Take 1 tablet (10 mg total) by mouth daily.    The patient was advised to call or return to clinic if he does not see an improvement in symptoms or to seek the care of the closest emergency department if he worsens with the above plan.   Philis Fendt, MHS, PA-C [facility] 09/07/2015 9:14 AM

## 2015-09-06 LAB — MICROALBUMIN, URINE: MICROALB UR: 0.3 mg/dL

## 2015-09-10 ENCOUNTER — Encounter: Payer: Self-pay | Admitting: Internal Medicine

## 2015-09-13 ENCOUNTER — Encounter: Payer: Self-pay | Admitting: Family Medicine

## 2015-10-14 HISTORY — PX: COLONOSCOPY: SHX174

## 2015-10-27 ENCOUNTER — Telehealth: Payer: Self-pay | Admitting: Family Medicine

## 2015-10-27 NOTE — Telephone Encounter (Signed)
lmom to call and reschedule appt with Legrand Como

## 2015-11-01 ENCOUNTER — Encounter: Payer: Self-pay | Admitting: Internal Medicine

## 2015-11-14 ENCOUNTER — Ambulatory Visit (AMBULATORY_SURGERY_CENTER): Payer: Self-pay | Admitting: *Deleted

## 2015-11-14 VITALS — Ht 68.0 in | Wt 170.0 lb

## 2015-11-14 DIAGNOSIS — Z1211 Encounter for screening for malignant neoplasm of colon: Secondary | ICD-10-CM

## 2015-11-14 MED ORDER — NA SULFATE-K SULFATE-MG SULF 17.5-3.13-1.6 GM/177ML PO SOLN: ORAL | Status: DC

## 2015-11-14 NOTE — Progress Notes (Signed)
Patient denies any allergies to eggs or soy. Patient denies any problems with anesthesia/sedation. Patient denies any oxygen use at home and does not take any diet/weight loss medications. EMMI education assisgned to patient on colonoscopy, this was explained and instructions given to patient. 

## 2015-11-27 ENCOUNTER — Encounter: Payer: Self-pay | Admitting: Internal Medicine

## 2015-11-28 ENCOUNTER — Encounter: Payer: Self-pay | Admitting: Internal Medicine

## 2015-12-06 ENCOUNTER — Ambulatory Visit: Payer: Managed Care, Other (non HMO) | Admitting: Physician Assistant

## 2015-12-12 ENCOUNTER — Ambulatory Visit: Payer: Managed Care, Other (non HMO) | Admitting: Physician Assistant

## 2015-12-19 ENCOUNTER — Ambulatory Visit: Payer: Managed Care, Other (non HMO) | Admitting: Physician Assistant

## 2015-12-20 ENCOUNTER — Encounter: Payer: Self-pay | Admitting: Internal Medicine

## 2015-12-20 ENCOUNTER — Ambulatory Visit (AMBULATORY_SURGERY_CENTER): Payer: Managed Care, Other (non HMO) | Admitting: Internal Medicine

## 2015-12-20 VITALS — BP 102/59 | HR 55 | Temp 98.0°F | Resp 22 | Ht 68.0 in | Wt 170.0 lb

## 2015-12-20 DIAGNOSIS — D125 Benign neoplasm of sigmoid colon: Secondary | ICD-10-CM

## 2015-12-20 DIAGNOSIS — Z1211 Encounter for screening for malignant neoplasm of colon: Secondary | ICD-10-CM | POA: Diagnosis present

## 2015-12-20 MED ORDER — SODIUM CHLORIDE 0.9 % IV SOLN: 500.0000 mL | INTRAVENOUS | Status: DC

## 2015-12-20 NOTE — Progress Notes (Signed)
Report to PACU, RN, vss, BBS= Clear.  

## 2015-12-20 NOTE — Progress Notes (Signed)
Called to room to assist during endoscopic procedure.  Patient ID and intended procedure confirmed with present staff. Received instructions for my participation in the procedure from the performing physician.  

## 2015-12-20 NOTE — Op Note (Signed)
[place] Patient Name: [name] [name] Procedure Date: 12/20/2015 8:47 AM MRN: [id number] Endoscopist: Docia Chuck. Henrene Pastor , MD Age: 50 Referring MD:  Date of Birth: 01-Jul-1966 Gender: Male Procedure:                Colonoscopy Indications:              Screening for colorectal malignant neoplasm Medicines:                Monitored Anesthesia Care Procedure:                Pre-Anesthesia Assessment:                           - Prior to the procedure, a History and Physical                            was performed, and patient medications and                            allergies were reviewed. The patient's tolerance of                            previous anesthesia was also reviewed. The risks                            and benefits of the procedure and the sedation                            options and risks were discussed with the patient.                            All questions were answered, and informed consent                            was obtained. Prior Anticoagulants: The patient has                            taken no previous anticoagulant or antiplatelet                            agents. ASA Grade Assessment: II - A patient with                            mild systemic disease. After reviewing the risks                            and benefits, the patient was deemed in                            satisfactory condition to undergo the procedure.                           After obtaining informed consent, the colonoscope  was passed under direct vision. Throughout the                            procedure, the patient's blood pressure, pulse, and                            oxygen saturations were monitored continuously. The                            Model PCF-H190L [id number]) scope was introduced                            through the anus and advanced to the the cecum,                            identified by appendiceal orifice and  ileocecal                            valve. The colonoscopy was performed without                            difficulty. The quality of the bowel preparation                            was excellent. The ileocecal valve, appendiceal                            orifice, and rectum were photographed. The bowel                            preparation used was SUPREP. Scope In: 9:13:57 AM Scope Out: 9:24:54 AM Scope Withdrawal Time: 0 hours 9 minutes 57 seconds  Total Procedure Duration: 0 hours 10 minutes 57 seconds  Findings:      The perianal and digital rectal examinations were normal.      A 3 mm polyp was found in the sigmoid colon. The polyp was removed with       a cold snare. Resection and retrieval were complete.      The exam was otherwise without abnormality on direct and retroflexion       views. Complications:            No immediate complications. Estimated Blood Loss:     Estimated blood loss: none. Impression:               - One 3 mm polyp in the sigmoid colon, removed with                            a cold snare. Resected and retrieved.                           - The examination was otherwise normal on direct                            and retroflexion views. Recommendation:           -  Patient has a contact number available for                            emergencies. The signs and symptoms of potential                            delayed complications were discussed with the                            patient. Return to normal activities tomorrow.                            Written discharge instructions were provided to the                            patient.                           - Resume previous diet.                           - Continue present medications.                           - If the pathology report reveals adenomatous                            tissue, then repeat the colonoscopy for                            surveillance in 5 years.                            - If the pathology report reveals no adenomatous                            tissue, then repeat the colonoscopy for                            surveillance in 10 years. Procedure Code(s):        --- Professional ---                           [phone], Colonoscopy, flexible; with removal of                            tumor(s), polyp(s), or other lesion(s) by snare                            technique CPT copyright 2016 American Medical Association. All rights reserved. Docia Chuck. Henrene Pastor, MD Docia Chuck. Henrene Pastor, MD 12/20/2015 9:40:12 AM This report has been signed electronically. Number of Addenda: 0

## 2015-12-20 NOTE — Patient Instructions (Signed)
YOU HAD AN ENDOSCOPIC PROCEDURE TODAY AT [facility]:   Refer to the procedure report that was given to you for any specific questions about what was found during the examination.  If the procedure report does not answer your questions, please call your gastroenterologist to clarify.  If you requested that your care partner not be given the details of your procedure findings, then the procedure report has been included in a sealed envelope for you to review at your convenience later.  YOU SHOULD EXPECT: Some feelings of bloating in the abdomen. Passage of more gas than usual.  Walking can help get rid of the air that was put into your GI tract during the procedure and reduce the bloating. If you had a lower endoscopy (such as a colonoscopy or flexible sigmoidoscopy) you may notice spotting of blood in your stool or on the toilet paper. If you underwent a bowel prep for your procedure, you may not have a normal bowel movement for a few days.  Please Note:  You might notice some irritation and congestion in your nose or some drainage.  This is from the oxygen used during your procedure.  There is no need for concern and it should clear up in a day or so.  SYMPTOMS TO REPORT IMMEDIATELY:   Following lower endoscopy (colonoscopy or flexible sigmoidoscopy):  Excessive amounts of blood in the stool  Significant tenderness or worsening of abdominal pains  Swelling of the abdomen that is new, acute  Fever of 100F or higher For urgent or emergent issues, a gastroenterologist can be reached at any hour by calling [phone].   DIET: Your first meal following the procedure should be a small meal and then it is ok to progress to your normal diet. Heavy or fried foods are harder to digest and may make you feel nauseous or bloated.  Likewise, meals heavy in dairy and vegetables can increase bloating.  Drink plenty of fluids but you should avoid alcoholic beverages for 24 hours.  ACTIVITY:   You should plan to take it easy for the rest of today and you should NOT DRIVE or use heavy machinery until tomorrow (because of the sedation medicines used during the test).    FOLLOW UP: Our staff will call the number listed on your records the next business day following your procedure to check on you and address any questions or concerns that you may have regarding the information given to you following your procedure. If we do not reach you, we will leave a message.  However, if you are feeling well and you are not experiencing any problems, there is no need to return our call.  We will assume that you have returned to your regular daily activities without incident.  If any biopsies were taken you will be contacted by phone or by letter within the next 1-3 weeks.  Please call us at [phone] if you have not heard about the biopsies in 3 weeks.    SIGNATURES/CONFIDENTIALITY: You and/or your care partner have signed paperwork which will be entered into your electronic medical record.  These signatures attest to the fact that that the information above on your After Visit Summary has been reviewed and is understood.  Full responsibility of the confidentiality of this discharge information lies with you and/or your care-partner.  Please read all handouts given to you by your recovery RN. Thank you for letting us take care of your healthcare needs.

## 2015-12-21 ENCOUNTER — Telehealth: Payer: Self-pay | Admitting: *Deleted

## 2015-12-21 NOTE — Telephone Encounter (Signed)
No answer, left message to call if questions or concerns. 

## 2015-12-25 ENCOUNTER — Encounter: Payer: Self-pay | Admitting: Internal Medicine

## 2015-12-26 ENCOUNTER — Ambulatory Visit: Payer: Managed Care, Other (non HMO) | Admitting: Physician Assistant

## 2016-01-29 ENCOUNTER — Other Ambulatory Visit: Payer: Self-pay | Admitting: Physician Assistant

## 2016-02-13 ENCOUNTER — Ambulatory Visit (INDEPENDENT_AMBULATORY_CARE_PROVIDER_SITE_OTHER): Payer: Managed Care, Other (non HMO) | Admitting: Physician Assistant

## 2016-02-13 VITALS — BP 124/72 | HR 65 | Temp 98.2°F | Resp 15 | Ht 68.0 in | Wt 168.8 lb

## 2016-02-13 DIAGNOSIS — K2100 Gastro-esophageal reflux disease with esophagitis, without bleeding: Secondary | ICD-10-CM

## 2016-02-13 DIAGNOSIS — I10 Essential (primary) hypertension: Secondary | ICD-10-CM | POA: Diagnosis not present

## 2016-02-13 DIAGNOSIS — E119 Type 2 diabetes mellitus without complications: Secondary | ICD-10-CM | POA: Diagnosis not present

## 2016-02-13 LAB — COMPLETE METABOLIC PANEL WITH GFR
ALT: 16 U/L (ref 9–46)
AST: 11 U/L (ref 10–35)
Albumin: 4.1 g/dL (ref 3.6–5.1)
Alkaline Phosphatase: 81 U/L (ref 40–115)
BILIRUBIN TOTAL: 0.5 mg/dL (ref 0.2–1.2)
BUN: 17 mg/dL (ref 7–25)
CALCIUM: 9.6 mg/dL (ref 8.6–10.3)
CHLORIDE: 100 mmol/L (ref 98–110)
CO2: 28 mmol/L (ref 20–31)
Creat: 0.92 mg/dL (ref 0.70–1.33)
Glucose, Bld: 83 mg/dL (ref 65–99)
Potassium: 4.2 mmol/L (ref 3.5–5.3)
Sodium: 136 mmol/L (ref 135–146)
Total Protein: 7.2 g/dL (ref 6.1–8.1)

## 2016-02-13 LAB — POCT GLYCOSYLATED HEMOGLOBIN (HGB A1C): Hemoglobin A1C: 6.5

## 2016-02-13 MED ORDER — RANITIDINE HCL 150 MG PO TABS: 150.0000 mg | ORAL_TABLET | Freq: Two times a day (BID) | ORAL | Status: DC | PRN

## 2016-02-13 MED ORDER — LISINOPRIL 10 MG PO TABS: 10.0000 mg | ORAL_TABLET | Freq: Every day | ORAL | Status: DC

## 2016-02-13 NOTE — Progress Notes (Signed)
4  02/13/2016 3:45 PM   DOB: 04-Feb-1966 / MRN: [id number]  SUBJECTIVE:  [name] [name] is a 50 y.o. male presenting for a recheck of DM2.  Reports he has stopped drinking sugar all the time and is also exercising twice a week via treadmill and resistance machines.  States he has lost weight and is feeling better.    Complains of a burning sensation in his chest that occurs when he is stressed.  Says that he never has it when he is exercising and he has been jogging on the treadmill.  This has been going on for months and he takes TUMS and the pain goes away.  This occurs roughly once per week.   Has been taking Lisinopril and denies any complications with this.  A1c at last check was 6.8.  He wanted to pursue weight loss at that time and hold any diabetic medication. He denies a family history of CAD. He would like to try coming off of his blood pressure medication if his A1c is improved.  He plans to continue pursuing weight loss and exercise.    He has No Known Allergies.   He  has a past medical history of Allergy; Asthma; Heart murmur; Arthritis; Kidney stones; Pericarditis; Pneumonia; and Hypertension.    He  reports that he has never smoked. He has never used smokeless tobacco. He reports that he does not drink alcohol or use illicit drugs. He  reports that he currently engages in sexual activity. The patient  has past surgical history that includes Hernia repair.  His family history includes Asthma in his mother; Cancer in his father and mother; Diabetes in his father, maternal grandmother, and mother; Heart disease in his maternal grandfather and mother; Kidney disease in his father; Lung cancer in his mother; Stroke in his daughter. There is no history of Colon cancer.  Review of Systems  Constitutional: Negative for fever.  Respiratory: Negative for cough.   Cardiovascular: Negative for chest pain.  Gastrointestinal: Positive for heartburn. Negative for nausea, vomiting and abdominal pain.    Skin: Negative for rash.  Neurological: Negative for dizziness and headaches.    Problem list and medications reviewed and updated by myself where necessary, and exist elsewhere in the encounter.   OBJECTIVE:  BP 124/72 mmHg  Pulse 65  Temp(Src) 98.2 F (36.8 C) (Oral)  Resp 15  Ht 5\' 8"  (1.727 m)  Wt 168 lb 12.8 oz (76.567 kg)  BMI 25.67 kg/m2  SpO2 99%  Physical Exam  Constitutional: He is oriented to person, place, and time. He appears well-developed and well-nourished. No distress.  Cardiovascular: Normal rate and regular rhythm.   Pulmonary/Chest: Effort normal and breath sounds normal.  Abdominal: Soft. Bowel sounds are normal.  Neurological: He is alert and oriented to person, place, and time.  Skin: Skin is warm and dry. He is not diaphoretic.  Psychiatric: He has a normal mood and affect.  Vitals reviewed.     Results for orders placed or performed in visit on 02/13/16 (from the past 72 hour(s))  POCT glycosylated hemoglobin (Hb A1C)     Status: None   Collection Time: 02/13/16  3:41 PM  Result Value Ref Range   Hemoglobin A1C 6.5     No results found.  ASSESSMENT AND PLAN  [name] was seen today for follow-up, hypertension and diabetes.  Diagnoses and all orders for this visit:  Controlled type 2 diabetes mellitus without complication, without long-term current use of insulin [facility]): He has  an improved A1c.  Will continue to hold on Metformin and will see him back in three months.  Advised that he cut out fast food and any other source of sugar.   -     POCT glycosylated hemoglobin (Hb A1C) -     COMPLETE METABOLIC PANEL WITH GFR  Gastroesophageal reflux disease with esophagitis: EKG is normal here along with his exam.  Advised ranitidine.   -     EKG 12-Lead -     ranitidine (ZANTAC) 150 MG tablet; Take 1 tablet (150 mg total) by mouth 2 (two) times daily as needed for heartburn.  Essential hypertension: Continue current plan.     The patient was  advised to call or return to clinic if he does not see an improvement in symptoms or to seek the care of the closest emergency department if he worsens with the above plan.   Philis Fendt, MHS, PA-C [facility] 02/13/2016 3:45 PM

## 2016-02-13 NOTE — Patient Instructions (Addendum)
Continue exercising.  Do not eat fast food.  Continue your BP medication for now.      IF you received an x-ray today, you will receive an invoice from [facility]. Please contact [facility] at [phone] with questions or concerns regarding your invoice.   IF you received labwork today, you will receive an invoice from [employer]. Please contact [facility] at [phone] with questions or concerns regarding your invoice.   Our billing staff will not be able to assist you with questions regarding bills from these companies.  You will be contacted with the lab results as soon as they are available. The fastest way to get your results is to activate your My Chart account. Instructions are located on the last page of this paperwork. If you have not heard from [place] regarding the results in 2 weeks, please contact this office.

## 2016-03-07 ENCOUNTER — Other Ambulatory Visit: Payer: Self-pay | Admitting: Physician Assistant

## 2016-04-27 ENCOUNTER — Other Ambulatory Visit: Payer: Self-pay | Admitting: Physician Assistant

## 2016-05-03 ENCOUNTER — Other Ambulatory Visit: Payer: Self-pay | Admitting: Physician Assistant

## 2016-05-05 ENCOUNTER — Other Ambulatory Visit: Payer: Self-pay | Admitting: Physician Assistant

## 2016-05-05 DIAGNOSIS — K2100 Gastro-esophageal reflux disease with esophagitis, without bleeding: Secondary | ICD-10-CM

## 2016-06-20 ENCOUNTER — Encounter: Payer: Self-pay | Admitting: Physician Assistant

## 2016-06-20 ENCOUNTER — Ambulatory Visit (INDEPENDENT_AMBULATORY_CARE_PROVIDER_SITE_OTHER): Payer: Managed Care, Other (non HMO) | Admitting: Physician Assistant

## 2016-06-20 VITALS — BP 102/66 | HR 60 | Temp 98.3°F | Resp 18 | Ht 68.0 in | Wt 167.0 lb

## 2016-06-20 DIAGNOSIS — Z789 Other specified health status: Secondary | ICD-10-CM | POA: Diagnosis not present

## 2016-06-20 DIAGNOSIS — R7309 Other abnormal glucose: Secondary | ICD-10-CM

## 2016-06-20 LAB — POCT GLYCOSYLATED HEMOGLOBIN (HGB A1C): Hemoglobin A1C: 6.5

## 2016-06-20 MED ORDER — METFORMIN HCL 500 MG PO TABS: 500.0000 mg | ORAL_TABLET | Freq: Every day | ORAL | 1 refills | Status: DC

## 2016-06-20 NOTE — Patient Instructions (Signed)
     IF you received an x-ray today, you will receive an invoice from [facility]. Please contact  Radiology at [phone] with questions or concerns regarding your invoice.   IF you received labwork today, you will receive an invoice from [facility]. Please contact [facility] at [phone] with questions or concerns regarding your invoice.   Our billing staff will not be able to assist you with questions regarding bills from these companies.  You will be contacted with the lab results as soon as they are available. The fastest way to get your results is to activate your My Chart account. Instructions are located on the last page of this paperwork. If you have not heard from us regarding the results in 2 weeks, please contact this office.      

## 2016-06-20 NOTE — Progress Notes (Signed)
06/20/2016 2:59 PM   DOB: [birth year] / MRN: [id number]  SUBJECTIVE:  [name] [name] is a 50 y.o. male presenting for diabetes recheck.  He does the treadmill 2-3 times per week for 20-30 minutes.  He also does some weight machines. He does not drink any sugar.  He does not eat frozen meals and eats sandwiches and fruit. He dos  He denies any sock and glove paresthesia.  He has one episode of nocturia nightly.   He did see an eye care clinician who advised that he may have some changes to his eye vasculature related to elevated blood sugar.      He would like to try coming off of his BP medication as he has been checking his pressure and it has been measuring 100/60 consistently.   Immunization History  Administered Date(s) Administered  . Influenza Split 07/13/2014, 08/07/2015  . Pneumococcal Conjugate-13 08/29/2015  . Pneumococcal Polysaccharide-23 09/22/2014  . Tdap 08/29/2015      He has No Known Allergies.   He  has a past medical history of Allergy; Arthritis; Asthma; Heart murmur; Hypertension; Kidney stones; Pericarditis; and Pneumonia.    He  reports that he has never smoked. He has never used smokeless tobacco. He reports that he does not drink alcohol or use drugs. He  reports that he currently engages in sexual activity. The patient  has a past surgical history that includes Hernia repair.  His family history includes Asthma in his mother; Cancer in his father and mother; Diabetes in his father, maternal grandmother, and mother; Heart disease in his maternal grandfather and mother; Kidney disease in his father; Lung cancer in his mother; Stroke in his daughter.  Review of Systems  Constitutional: Negative for fever.  Cardiovascular: Negative for chest pain.  Genitourinary: Negative for urgency.  Musculoskeletal: Negative for myalgias.  Skin: Negative for rash.  Neurological: Negative for dizziness and headaches.    The problem list and medications were reviewed and  updated by myself where necessary and exist elsewhere in the encounter.   OBJECTIVE:  BP 102/66 (BP Location: Right Arm, Patient Position: Sitting, Cuff Size: Small)   Pulse 60   Temp 98.3 F (36.8 C) (Oral)   Resp 18   Ht 5\' 8"  (1.727 m)   Wt 167 lb (75.8 kg)   SpO2 98%   BMI 25.39 kg/m     Physical Exam  Constitutional: He is oriented to person, place, and time.  Cardiovascular: Normal rate and regular rhythm.   Pulmonary/Chest: Effort normal and breath sounds normal.  Musculoskeletal: Normal range of motion.  Neurological: He is alert and oriented to person, place, and time. No cranial nerve deficit.  Skin: Skin is warm and dry.     Lab Results  Component Value Date   CHOL 192 08/29/2015   HDL 48 08/29/2015   LDLCALC 132 (H) 08/29/2015   TRIG 62 08/29/2015   CHOLHDL 4.0 08/29/2015   Lab Results  Component Value Date   CREATININE 0.92 02/13/2016    Results for orders placed or performed in visit on 06/20/16 (from the past 72 hour(s))  POCT glycosylated hemoglobin (Hb A1C)     Status: None   Collection Time: 06/20/16  2:21 PM  Result Value Ref Range   Hemoglobin A1C 6.5    Wt Readings from Last 3 Encounters:  06/20/16 167 lb (75.8 kg)  02/13/16 168 lb 12.8 oz (76.6 kg)  12/20/15 170 lb (77.1 kg)     No results found.  ASSESSMENT AND PLAN  [name] [name] was seen today for follow-up.  Diagnoses and all orders for this visit:  Elevated hemoglobin A1c: I am holding his Lisinopril for now.  He will call if his pressures are greater that 135/90 on a consistent basis and if so I will start it back a 5 mg.  I am starting on low dose metformin.  He will come back in three months for a recheck a1c. If less than 6 will stop metformin and recheck.    -     POCT glycosylated hemoglobin (Hb A1C) -     metFORMIN (GLUCOPHAGE) 500 MG tablet; Take 1 tablet (500 mg total) by mouth daily with breakfast.  LDL-c greater than or equal to 100 mg/dl -     Lipid panel    The  patient is advised to call or return to clinic if he does not see an improvement in symptoms, or to seek the care of the closest emergency department if he worsens with the above plan.   Philis Fendt, MHS, PA-C [facility] 06/20/2016 2:59 PM

## 2016-06-21 LAB — LIPID PANEL
CHOL/HDL RATIO: 3.8 ratio (ref <=5.0)
CHOLESTEROL: 195 mg/dL (ref 125–200)
HDL: 51 mg/dL (ref >=40)
LDL CALC: 115 mg/dL (ref <130)
Triglycerides: 145 mg/dL (ref <150)
VLDL: 29 mg/dL (ref <30)

## 2016-10-20 ENCOUNTER — Ambulatory Visit (INDEPENDENT_AMBULATORY_CARE_PROVIDER_SITE_OTHER): Payer: Managed Care, Other (non HMO) | Admitting: Physician Assistant

## 2016-10-20 VITALS — BP 116/72 | HR 58 | Temp 97.9°F | Resp 18 | Ht 68.0 in | Wt 166.0 lb

## 2016-10-20 DIAGNOSIS — R7309 Other abnormal glucose: Secondary | ICD-10-CM | POA: Diagnosis not present

## 2016-10-20 DIAGNOSIS — E119 Type 2 diabetes mellitus without complications: Secondary | ICD-10-CM | POA: Diagnosis not present

## 2016-10-20 LAB — POCT GLYCOSYLATED HEMOGLOBIN (HGB A1C): Hemoglobin A1C: 6.3

## 2016-10-20 MED ORDER — METFORMIN HCL 500 MG PO TABS: 500.0000 mg | ORAL_TABLET | Freq: Every day | ORAL | 1 refills | Status: DC

## 2016-10-20 NOTE — Patient Instructions (Signed)
     IF you received an x-ray today, you will receive an invoice from [facility]. Please contact [facility] at [phone] with questions or concerns regarding your invoice.   IF you received labwork today, you will receive an invoice from [facility]. Please contact [facility] at [phone] with questions or concerns regarding your invoice.   Our billing staff will not be able to assist you with questions regarding bills from these companies.  You will be contacted with the lab results as soon as they are available. The fastest way to get your results is to activate your My Chart account. Instructions are located on the last page of this paperwork. If you have not heard from us regarding the results in 2 weeks, please contact this office.     

## 2016-10-20 NOTE — Progress Notes (Signed)
10/20/2016 11:52 AM   DOB: [birth year] / MRN: [id number]  SUBJECTIVE:  [name] [name] is a 51 y.o. male presenting for diabetes follow up.  He feels well today. He has lost weight over the hollidays.  He tries to avoid sugar but admits to occasional milk shakes.  Taking metformin 500 mg daily with breakfast without side effects.  No sock and glove paresthesia. No chest pain, SOB, DOE.  No foot lesions. He is seeing an eye doctor every six months.   Immunization History  Administered Date(s) Administered  . Influenza Split 07/13/2014, 08/07/2015  . Pneumococcal Conjugate-13 08/29/2015  . Pneumococcal Polysaccharide-23 09/22/2014  . Tdap 08/29/2015       He has No Known Allergies.   He  has a past medical history of Allergy; Arthritis; Asthma; Heart murmur; Hypertension; Kidney stones; Pericarditis; and Pneumonia.    He  reports that he has never smoked. He has never used smokeless tobacco. He reports that he does not drink alcohol or use drugs. He  reports that he currently engages in sexual activity. The patient  has a past surgical history that includes Hernia repair.  His family history includes Asthma in his mother; Cancer in his father and mother; Diabetes in his father, maternal grandmother, and mother; Heart disease in his maternal grandfather and mother; Kidney disease in his father; Lung cancer in his mother; Stroke in his daughter.  Review of Systems  Constitutional: Negative for chills and fever.  Gastrointestinal: Negative for nausea.  Skin: Negative for rash.  Neurological: Negative for dizziness.    The problem list and medications were reviewed and updated by myself where necessary and exist elsewhere in the encounter.   OBJECTIVE:  BP 116/72 (BP Location: Right Arm, Patient Position: Sitting, Cuff Size: Small)   Pulse (!) 58   Temp 97.9 F (36.6 C) (Oral)   Resp 18   Ht 5\' 8"  (1.727 m)   Wt 166 lb (75.3 kg)   SpO2 98%   BMI 25.24 kg/m   Wt Readings from Last 3  Encounters:  10/20/16 166 lb (75.3 kg)  06/20/16 167 lb (75.8 kg)  02/13/16 168 lb 12.8 oz (76.6 kg)   Physical Exam  Constitutional: He is oriented to person, place, and time. He appears well-developed. He does not appear ill.  Eyes: Conjunctivae and EOM are normal. Pupils are equal, round, and reactive to light.  Cardiovascular: Normal rate and regular rhythm.   Pulmonary/Chest: Effort normal and breath sounds normal.  Abdominal: He exhibits no distension.  Musculoskeletal: Normal range of motion.       Feet:  Neurological: He is alert and oriented to person, place, and time. No cranial nerve deficit. Coordination normal.  Skin: Skin is warm and dry. He is not diaphoretic.  Psychiatric: He has a normal mood and affect.  Nursing note and vitals reviewed.    Lab Results  Component Value Date   ALT 16 02/13/2016   AST 11 02/13/2016   ALKPHOS 81 02/13/2016   BILITOT 0.5 02/13/2016   Results for orders placed or performed in visit on 10/20/16 (from the past 72 hour(s))  POCT glycosylated hemoglobin (Hb A1C)     Status: None   Collection Time: 10/20/16 11:42 AM  Result Value Ref Range   Hemoglobin A1C 6.3    Lab Results  Component Value Date   MICROALBUR 0.3 09/05/2015   Lab Results  Component Value Date   CHOL 195 06/20/2016   HDL 51 06/20/2016   LDLCALC 115 06/20/2016  TRIG 145 06/20/2016   CHOLHDL 3.8 06/20/2016   ASSESSMENT AND PLAN:  [name] [name] was seen today for follow-up.  Diagnoses and all orders for this visit:  Controlled type 2 diabetes mellitus without complication, without long-term current use of insulin ([place]): I had him on lisinopril however he became dizzy with this.  His lipid are excellent.  He is losing weight.  He does not want to be on more medication. His ASCVD is 5.8.  Will repeat his lipids today. He will need another micro albumin at his next visit.  He is seeing the eye doctor bianually.  He has had his pneumonia shots already. I will see him  back in 6 months  Metformin refilled until then.  -     POCT glycosylated hemoglobin (Hb A1C) -     Lipid panel    The patient is advised to call or return to clinic if he does not see an improvement in symptoms, or to seek the care of the closest emergency department if he worsens with the above plan.   Philis Fendt, MHS, PA-C [facility] 10/20/2016 11:52 AM

## 2016-10-21 LAB — LIPID PANEL
CHOLESTEROL TOTAL: 179 mg/dL (ref 100–199)
Chol/HDL Ratio: 3.7 ratio units (ref 0.0–5.0)
HDL: 48 mg/dL (ref >39)
LDL Calculated: 88 mg/dL (ref 0–99)
TRIGLYCERIDES: 217 mg/dL — AB (ref 0–149)
VLDL CHOLESTEROL CAL: 43 mg/dL — AB (ref 5–40)

## 2016-10-23 ENCOUNTER — Ambulatory Visit: Payer: Managed Care, Other (non HMO)

## 2016-10-25 ENCOUNTER — Encounter: Payer: Self-pay | Admitting: Physician Assistant

## 2016-11-17 ENCOUNTER — Ambulatory Visit (INDEPENDENT_AMBULATORY_CARE_PROVIDER_SITE_OTHER): Payer: Managed Care, Other (non HMO) | Admitting: Physician Assistant

## 2016-11-17 ENCOUNTER — Encounter: Payer: Self-pay | Admitting: Physician Assistant

## 2016-11-17 VITALS — BP 128/74 | HR 74 | Temp 97.7°F | Resp 18 | Ht 68.0 in | Wt 168.0 lb

## 2016-11-17 DIAGNOSIS — E785 Hyperlipidemia, unspecified: Secondary | ICD-10-CM | POA: Diagnosis not present

## 2016-11-17 DIAGNOSIS — J069 Acute upper respiratory infection, unspecified: Secondary | ICD-10-CM

## 2016-11-17 MED ORDER — OXYMETAZOLINE HCL-MENTHOL 0.05 % NA SOLN: NASAL | 0 refills | Status: DC

## 2016-11-17 MED ORDER — CETIRIZINE-PSEUDOEPHEDRINE ER 5-120 MG PO TB12: 1.0000 | ORAL_TABLET | Freq: Two times a day (BID) | ORAL | 0 refills | Status: DC

## 2016-11-17 MED ORDER — AMOXICILLIN-POT CLAVULANATE 875-125 MG PO TABS: 1.0000 | ORAL_TABLET | Freq: Two times a day (BID) | ORAL | 0 refills | Status: DC

## 2016-11-17 NOTE — Patient Instructions (Signed)
     IF you received an x-ray today, you will receive an invoice from [facility]. Please contact [facility] at [phone] with questions or concerns regarding your invoice.   IF you received labwork today, you will receive an invoice from [facility]. Please contact [facility] at [phone] with questions or concerns regarding your invoice.   Our billing staff will not be able to assist you with questions regarding bills from these companies.  You will be contacted with the lab results as soon as they are available. The fastest way to get your results is to activate your My Chart account. Instructions are located on the last page of this paperwork. If you have not heard from us regarding the results in 2 weeks, please contact this office.     

## 2016-11-17 NOTE — Progress Notes (Signed)
11/17/2016 3:05 PM   DOB: 1966/01/19 / MRN: [id number]  SUBJECTIVE:  [name] [name] is a 51 y.o. male presenting for sinus congestion.  He went to the [facility] a week into the illness and was placed on Augmentin for 7 days.  He also had a cough and nasal congestion with teeth pain.  He does feel that he is getting better. He is on day 15 of illness total.   He has No Known Allergies.   He  has a past medical history of Allergy; Arthritis; Asthma; Heart murmur; Hypertension; Kidney stones; Pericarditis; and Pneumonia.    He  reports that he has never smoked. He has never used smokeless tobacco. He reports that he does not drink alcohol or use drugs. He  reports that he currently engages in sexual activity. The patient  has a past surgical history that includes Hernia repair.  His family history includes Asthma in his mother; Cancer in his father and mother; Diabetes in his father, maternal grandmother, and mother; Heart disease in his maternal grandfather and mother; Kidney disease in his father; Lung cancer in his mother; Stroke in his daughter.  Review of Systems  Constitutional: Negative for chills and fever.  HENT: Negative for sore throat.   Gastrointestinal: Negative for nausea.  Skin: Negative for itching and rash.  Neurological: Negative for dizziness.    The problem list and medications were reviewed and updated by myself where necessary and exist elsewhere in the encounter.   OBJECTIVE:  BP 128/74 (BP Location: Right Arm, Patient Position: Sitting, Cuff Size: Small)   Pulse 74   Temp 97.7 F (36.5 C) (Oral)   Resp 18   Ht 5\' 8"  (1.727 m)   Wt 168 lb (76.2 kg)   SpO2 98%   BMI 25.54 kg/m   Physical Exam  Constitutional: He is oriented to person, place, and time. He appears well-developed and well-nourished. No distress.  Cardiovascular: Normal rate and regular rhythm.   Pulmonary/Chest: Effort normal and breath sounds normal. No accessory muscle usage. No respiratory  distress. He has no decreased breath sounds. He has no wheezes. He has no rhonchi. He has no rales.  Musculoskeletal: Normal range of motion.  Neurological: He is alert and oriented to person, place, and time.  Skin: Skin is warm and dry. He is not diaphoretic.  Vitals reviewed.   No results found for this or any previous visit (from the past 72 hour(s)).  No results found.  ASSESSMENT AND PLAN:  [name] [name] was seen today for follow-up.  Diagnoses and all orders for this visit:  Acute URI: Advised that he hold abx for now given he has already had augmentin.  He will try zyrtec-d and oxymetazoline for now and hold abx for the next five days.  -     amoxicillin-clavulanate (AUGMENTIN) 875-125 MG tablet; Take 1 tablet by mouth 2 (two) times daily. -     cetirizine-pseudoephedrine (ZYRTEC-D) 5-120 MG tablet; Take 1 tablet by mouth 2 (two) times daily. -     Oxymetazoline HCl-Menthol 0.05 % SOLN; Do not use for more than 3 days in a row.  Use 1 spray in each nare before plain ride.  Dyslipidemia -     Lipoprotein A (LPA) -     High sensitivity CRP    The patient is advised to call or return to clinic if he does not see an improvement in symptoms, or to seek the care of the closest emergency department if he worsens with the  above plan.   Philis Fendt, MHS, PA-C [facility] 11/17/2016 3:05 PM

## 2016-11-18 LAB — LIPOPROTEIN A (LPA): LIPOPROTEIN (A): 201 nmol/L — AB (ref <75)

## 2016-11-18 LAB — HIGH SENSITIVITY CRP: CRP HIGH SENSITIVITY: 3.36 mg/L — AB (ref 0.00–3.00)

## 2016-11-19 ENCOUNTER — Encounter: Payer: Self-pay | Admitting: Physician Assistant

## 2016-11-24 MED ORDER — ATORVASTATIN CALCIUM 40 MG PO TABS: 40.0000 mg | ORAL_TABLET | Freq: Every day | ORAL | 3 refills | Status: DC

## 2017-01-07 ENCOUNTER — Telehealth: Payer: Self-pay | Admitting: Physician Assistant

## 2017-01-07 NOTE — Telephone Encounter (Signed)
Opened in error. Philis Fendt, MS, PA-C 8:44 PM, 01/07/2017

## 2017-03-19 ENCOUNTER — Ambulatory Visit (INDEPENDENT_AMBULATORY_CARE_PROVIDER_SITE_OTHER): Payer: 59 | Admitting: Physician Assistant

## 2017-03-19 VITALS — BP 124/64 | HR 69 | Temp 97.9°F | Resp 16 | Ht 66.5 in | Wt 166.4 lb

## 2017-03-19 DIAGNOSIS — E119 Type 2 diabetes mellitus without complications: Secondary | ICD-10-CM

## 2017-03-19 DIAGNOSIS — R21 Rash and other nonspecific skin eruption: Secondary | ICD-10-CM | POA: Diagnosis not present

## 2017-03-19 DIAGNOSIS — W57XXXA Bitten or stung by nonvenomous insect and other nonvenomous arthropods, initial encounter: Secondary | ICD-10-CM | POA: Diagnosis not present

## 2017-03-19 DIAGNOSIS — E1165 Type 2 diabetes mellitus with hyperglycemia: Secondary | ICD-10-CM

## 2017-03-19 HISTORY — DX: Type 2 diabetes mellitus with hyperglycemia: E11.65

## 2017-03-19 LAB — POCT URINALYSIS DIP (MANUAL ENTRY)
BILIRUBIN UA: NEGATIVE mg/dL
Bilirubin, UA: NEGATIVE
Glucose, UA: NEGATIVE mg/dL
Leukocytes, UA: NEGATIVE
Nitrite, UA: NEGATIVE
PROTEIN UA: NEGATIVE mg/dL
RBC UA: NEGATIVE
SPEC GRAV UA: 1.02 (ref 1.010–1.025)
UROBILINOGEN UA: 0.2 U/dL
pH, UA: 7 (ref 5.0–8.0)

## 2017-03-19 LAB — POCT GLYCOSYLATED HEMOGLOBIN (HGB A1C): HEMOGLOBIN A1C: 6.4

## 2017-03-19 NOTE — Patient Instructions (Signed)
     IF you received an x-ray today, you will receive an invoice from [facility]. Please contact [facility] at [phone] with questions or concerns regarding your invoice.   IF you received labwork today, you will receive an invoice from [facility]. Please contact [facility] at [phone] with questions or concerns regarding your invoice.   Our billing staff will not be able to assist you with questions regarding bills from these companies.  You will be contacted with the lab results as soon as they are available. The fastest way to get your results is to activate your My Chart account. Instructions are located on the last page of this paperwork. If you have not heard from us regarding the results in 2 weeks, please contact this office.     

## 2017-03-19 NOTE — Progress Notes (Signed)
03/19/2017 2:33 PM   DOB: Jun 24, 1966 / MRN: [id number]  SUBJECTIVE:  [name] [name] is a 51 y.o. male presenting for a tic bite that occurred 2 weeks ago.  Associates some low back pain that is dull and achy in the right flank. No fever, chills, photosensitivity, or HA.    He went for his eye exam and this was read as normal. That document is in this note as a photo.   He is a very well controlled diabetic and has been working hard at getting his weight down. He would like his A1c checked today.     Current Outpatient Prescriptions:  .  aspirin 81 MG tablet, Take 81 mg by mouth daily., Disp: , Rfl:  .  atorvastatin (LIPITOR) 40 MG tablet, Take 1 tablet (40 mg total) by mouth daily. Take one half tab for 14 days then start the full tab., Disp: 90 tablet, Rfl: 3 .  Coenzyme Q10 (CO Q 10 PO), Take by mouth., Disp: , Rfl:  .  Fish Oil-Cholecalciferol (FISH OIL + D3 PO), Take by mouth., Disp: , Rfl:  .  loratadine (CLARITIN) 10 MG tablet, Take 10 mg by mouth daily., Disp: , Rfl:  .  metFORMIN (GLUCOPHAGE) 500 MG tablet, Take 1 tablet (500 mg total) by mouth daily with breakfast., Disp: 90 tablet, Rfl: 1 .  Multiple Vitamin (MULTIVITAMIN) tablet, Take 1 tablet by mouth daily. Reported on 12/20/2015, Disp: , Rfl:    He has No Known Allergies.   He  has a past medical history of Allergy; Arthritis; Asthma; Heart murmur; Hypertension; Kidney stones; Pericarditis; and Pneumonia.    He  reports that he has never smoked. He has never used smokeless tobacco. He reports that he does not drink alcohol or use drugs. He  reports that he currently engages in sexual activity. The patient  has a past surgical history that includes Hernia repair.  His family history includes Asthma in his mother; Cancer in his father and mother; Diabetes in his father, maternal grandmother, and mother; Heart disease in his maternal grandfather and mother; Kidney disease in his father; Lung cancer in his mother; Stroke in his  daughter.  Review of Systems  Constitutional: Negative for chills and fever.  Respiratory: Negative for cough and shortness of breath.   Cardiovascular: Negative for chest pain and leg swelling.  Skin: Negative for itching and rash.  Neurological: Negative for dizziness.    The problem list and medications were reviewed and updated by myself where necessary and exist elsewhere in the encounter.   OBJECTIVE:  BP 124/64   Pulse 69   Temp 97.9 F (36.6 C) (Oral)   Resp 16   Ht 5' 6.5" (1.689 m)   Wt 166 lb 6.4 oz (75.5 kg)   SpO2 96%   BMI 26.46 kg/m   Wt Readings from Last 3 Encounters:  03/19/17 166 lb 6.4 oz (75.5 kg)  11/17/16 168 lb (76.2 kg)  10/20/16 166 lb (75.3 kg)     Physical Exam  Constitutional: He appears well-developed. He is active and cooperative.  Non-toxic appearance.  Cardiovascular: Normal rate, regular rhythm, S1 normal, S2 normal, normal heart sounds, intact distal pulses and normal pulses.  Exam reveals no gallop and no friction rub.   No murmur heard. Pulmonary/Chest: Effort normal. No stridor. No tachypnea. No respiratory distress. He has no wheezes. He has no rales.  Abdominal: He exhibits no distension.  Musculoskeletal: He exhibits no edema.  Neurological: He is alert.  Skin: Skin is warm and dry. He is not diaphoretic. No pallor.  Vitals reviewed.       Results for orders placed or performed in visit on 03/19/17 (from the past 72 hour(s))  POCT glycosylated hemoglobin (Hb A1C)     Status: None   Collection Time: 03/19/17  2:23 PM  Result Value Ref Range   Hemoglobin A1C 6.4     No results found.  ASSESSMENT AND PLAN:  Aden was seen today for tick bite.  Diagnoses and all orders for this visit:  Rash and nonspecific skin eruption: Mild reaction at the site of the bite, otherwise will screen.  -     Lyme Ab/Western Blot Reflex -     POCT urinalysis dipstick -     Creatine -     Ehrlichia antibody panel   Tick bite,  initial encounter: See above.   Controlled type 2 diabetes mellitus without complication, without long-term current use of insulin (H[facility])\       -     POCT glycosylated hemoglobin (Hb A1C)  The patient is advised to call or return to clinic if he does not see an improvement in symptoms, or to seek the care of the closest emergency department if he worsens with the above plan.   Philis Fendt, MHS, PA-C [facility] 03/19/2017 2:33 PM

## 2017-03-24 ENCOUNTER — Other Ambulatory Visit: Payer: Self-pay | Admitting: Physician Assistant

## 2017-03-24 DIAGNOSIS — R768 Other specified abnormal immunological findings in serum: Secondary | ICD-10-CM

## 2017-03-24 MED ORDER — DOXYCYCLINE HYCLATE 100 MG PO CAPS: 100.0000 mg | ORAL_CAPSULE | Freq: Two times a day (BID) | ORAL | 0 refills | Status: AC

## 2017-03-24 NOTE — Progress Notes (Signed)
Given positive titer I am going to treat him for lyme disease and retest the Western Blot.  He will come back in Friday for that lab. He feels well at this time. Philis Fendt, MS, PA-C 5:21 PM, 03/24/2017

## 2017-03-26 LAB — LYME AB/WESTERN BLOT REFLEX: LYME DISEASE AB, QUANT, IGM: 0.87 index — ABNORMAL HIGH (ref 0.00–0.79)

## 2017-03-26 LAB — LYME, WESTERN BLOT, SERUM (REFLEXED)
IGG P18 AB.: ABSENT
IGG P28 AB.: ABSENT
IGG P30 AB.: ABSENT
IGG P39 AB.: ABSENT
IGG P41 AB.: ABSENT
IGG P45 AB.: ABSENT
IGG P58 AB.: ABSENT
IGG P93 AB.: ABSENT
IgG P23 Ab.: ABSENT
IgG P66 Ab.: ABSENT
IgM P23 Ab.: ABSENT
IgM P39 Ab.: ABSENT
IgM P41 Ab.: ABSENT
LYME IGG WB: NEGATIVE
Lyme IgM Wb: NEGATIVE

## 2017-03-26 LAB — EHRLICHIA ANTIBODY PANEL
E. CHAFFEENSIS (HME) IGM TITER: NEGATIVE
E. CHAFFEENSIS IGG AB: NEGATIVE
HGE IgG Titer: NEGATIVE
HGE IgM Titer: NEGATIVE

## 2017-03-26 LAB — CREATINE: CREATINE, SERUM: 1.1 mg/dL — AB (ref 0.0–0.7)

## 2017-03-30 ENCOUNTER — Other Ambulatory Visit: Payer: 59 | Admitting: Physician Assistant

## 2017-03-30 DIAGNOSIS — R768 Other specified abnormal immunological findings in serum: Secondary | ICD-10-CM

## 2017-04-02 ENCOUNTER — Telehealth: Payer: Self-pay | Admitting: Family Medicine

## 2017-04-02 LAB — LYME, WESTERN BLOT, SERUM (REFLEXED)
IGG P45 AB.: ABSENT
IGG P58 AB.: ABSENT
IGM P23 AB.: ABSENT
IgG P18 Ab.: ABSENT
IgG P23 Ab.: ABSENT
IgG P28 Ab.: ABSENT
IgG P30 Ab.: ABSENT
IgG P39 Ab.: ABSENT
IgG P41 Ab.: ABSENT
IgG P66 Ab.: ABSENT
IgG P93 Ab.: ABSENT
IgM P39 Ab.: ABSENT
IgM P41 Ab.: ABSENT
LYME IGM WB: NEGATIVE
Lyme IgG Wb: NEGATIVE

## 2017-04-02 LAB — LYME AB/WESTERN BLOT REFLEX
LYME DISEASE AB, QUANT, IGM: 0.95 index — ABNORMAL HIGH (ref 0.00–0.79)
Lyme IgG/IgM Ab: <0.91 {ISR} (ref 0.00–0.90)

## 2017-04-02 NOTE — Telephone Encounter (Signed)
lmom to call and reschedule his appointment that was on 04-20-17 for a OV 6 month f/u pt has a $30 copay please let clerical handle this matter

## 2017-04-20 ENCOUNTER — Ambulatory Visit: Payer: Managed Care, Other (non HMO) | Admitting: Physician Assistant

## 2017-04-30 ENCOUNTER — Encounter: Payer: Self-pay | Admitting: Physician Assistant

## 2017-04-30 ENCOUNTER — Ambulatory Visit (INDEPENDENT_AMBULATORY_CARE_PROVIDER_SITE_OTHER): Payer: 59 | Admitting: Physician Assistant

## 2017-04-30 VITALS — BP 120/72 | HR 66 | Temp 97.5°F | Resp 18 | Ht 66.54 in | Wt 167.0 lb

## 2017-04-30 DIAGNOSIS — R21 Rash and other nonspecific skin eruption: Secondary | ICD-10-CM

## 2017-04-30 DIAGNOSIS — E119 Type 2 diabetes mellitus without complications: Secondary | ICD-10-CM | POA: Diagnosis not present

## 2017-04-30 DIAGNOSIS — R7309 Other abnormal glucose: Secondary | ICD-10-CM | POA: Diagnosis not present

## 2017-04-30 LAB — CREATININE WITH EST GFR
Creatinine, Ser: 0.99 mg/dL (ref 0.76–1.27)
GFR calc non Af Amer: 87 mL/min/{1.73_m2} (ref >59)
GFR, EST AFRICAN AMERICAN: 101 mL/min/{1.73_m2} (ref >59)

## 2017-04-30 LAB — LIPID PANEL
CHOLESTEROL TOTAL: 134 mg/dL (ref 100–199)
Chol/HDL Ratio: 2.9 ratio (ref 0.0–5.0)
HDL: 47 mg/dL (ref >39)
LDL CALC: 69 mg/dL (ref 0–99)
TRIGLYCERIDES: 91 mg/dL (ref 0–149)
VLDL Cholesterol Cal: 18 mg/dL (ref 5–40)

## 2017-04-30 LAB — POCT GLYCOSYLATED HEMOGLOBIN (HGB A1C): Hemoglobin A1C: 6.5

## 2017-04-30 MED ORDER — PERMETHRIN 5 % EX CREA: 1.0000 "application " | TOPICAL_CREAM | Freq: Once | CUTANEOUS | 1 refills | Status: AC

## 2017-04-30 MED ORDER — METFORMIN HCL 500 MG PO TABS: 500.0000 mg | ORAL_TABLET | Freq: Every day | ORAL | 1 refills | Status: DC

## 2017-04-30 MED ORDER — CEPHALEXIN 500 MG PO CAPS: 500.0000 mg | ORAL_CAPSULE | Freq: Two times a day (BID) | ORAL | 0 refills | Status: DC

## 2017-04-30 NOTE — Patient Instructions (Addendum)
Continue steroid cream. Okay to apply neosporin with anti itch.  Use permethrin as instructed. Consider getting some DEET spray to use while gardening.      IF you received an x-ray today, you will receive an invoice from [facility]. Please contact [facility] at [phone] with questions or concerns regarding your invoice.   IF you received labwork today, you will receive an invoice from [facility]. Please contact [facility] at [phone] with questions or concerns regarding your invoice.   Our billing staff will not be able to assist you with questions regarding bills from these companies.  You will be contacted with the lab results as soon as they are available. The fastest way to get your results is to activate your My Chart account. Instructions are located on the last page of this paperwork. If you have not heard from [place] regarding the results in 2 weeks, please contact this office.

## 2017-04-30 NOTE — Progress Notes (Signed)
04/30/2017 11:04 AM   DOB: July 28, 1966 / MRN: [id number]  SUBJECTIVE:  [name] [name] is a 51 y.o. male presenting for diabetes recheck.  Has been stable on metformin and has recently been started on Atorvastatin 40 mg without any ill effects.  He feels well overall today.   He does complain of a new rash.  Was recently tested for Lyme by me and while he did test positive equivocally for Lyme IgM he did not reflex positive.  He did take 21 days of doxy 100 mg bid.   He has No Known Allergies.   He  has a past medical history of Allergy; Arthritis; Asthma; Heart murmur; Hypertension; Kidney stones; Pericarditis; and Pneumonia.    He  reports that he has never smoked. He has never used smokeless tobacco. He reports that he does not drink alcohol or use drugs. He  reports that he currently engages in sexual activity. The patient  has a past surgical history that includes Hernia repair.  His family history includes Asthma in his mother; Cancer in his father and mother; Diabetes in his father, maternal grandmother, and mother; Heart disease in his maternal grandfather and mother; Kidney disease in his father; Lung cancer in his mother; Stroke in his daughter.  Review of Systems  Constitutional: Negative for chills and fever.  Gastrointestinal: Negative for nausea.  Genitourinary: Negative for dysuria.  Skin: Negative for itching and rash.  Neurological: Negative for dizziness.  Psychiatric/Behavioral: Negative for depression.    The problem list and medications were reviewed and updated by myself where necessary and exist elsewhere in the encounter.   OBJECTIVE:  BP 120/72   Pulse 66   Temp (!) 97.5 F (36.4 C) (Oral)   Resp 18   Ht 5' 6.53" (1.69 m)   Wt 167 lb (75.8 kg)   SpO2 95%   BMI 26.52 kg/m   Wt Readings from Last 3 Encounters:  04/30/17 167 lb (75.8 kg)  03/19/17 166 lb 6.4 oz (75.5 kg)  11/17/16 168 lb (76.2 kg)     Physical Exam  Constitutional: He appears  well-developed. He is active and cooperative.  Non-toxic appearance.  Cardiovascular: Normal rate, regular rhythm, S1 normal, S2 normal, normal heart sounds, intact distal pulses and normal pulses.  Exam reveals no gallop and no friction rub.   No murmur heard. Pulmonary/Chest: Effort normal. No tachypnea. He has no rales.  Abdominal: He exhibits no distension.  Musculoskeletal: He exhibits no edema.  Neurological: He is alert.  Skin: Skin is warm and dry. Rash (bug bites about the bilateral hands and on the webspaces of the right hand. Present about the chest and groin as well. ) noted. He is not diaphoretic. No pallor.  Vitals reviewed.    Results for orders placed or performed in visit on 04/30/17 (from the past 72 hour(s))  POCT glycosylated hemoglobin (Hb A1C)     Status: None   Collection Time: 04/30/17 11:00 AM  Result Value Ref Range   Hemoglobin A1C 6.5    Lab Results  Component Value Date   CHOL 179 10/20/2016   HDL 48 10/20/2016   LDLCALC 88 10/20/2016   TRIG 217 (H) 10/20/2016   CHOLHDL 3.7 10/20/2016   Lab Results  Component Value Date   CREATININE 0.92 02/13/2016     No results found.  ASSESSMENT AND PLAN:  Keanon was seen today for diabetes and rash.  Diagnoses and all orders for this visit:  Controlled type 2 diabetes mellitus  without complication, without long-term current use of insulin ([place]): BP well controlled.  A1c stable with low dose metformin.  He is taking a statin.   -     HM DIABETES FOOT EXAM -     POCT glycosylated hemoglobin (Hb A1C) -     Cancel: Microalbumin / creatinine urine ratio -     Microalbumin, urine -     Creatinine with Est GFR -     Lipid Panel  Rash and nonspecific skin eruption -     permethrin (ELIMITE) 5 % cream; Apply 1 application topically once. Apply from the neck down and leave on for 8 hours. -     cephALEXin (KEFLEX) 500 MG capsule; Take 1 capsule (500 mg total) by mouth 2 (two) times daily. Fill only if rash  becomes painful or redness worsens.  Elevated hemoglobin A1c -     metFORMIN (GLUCOPHAGE) 500 MG tablet; Take 1 tablet (500 mg total) by mouth daily with breakfast.    The patient is advised to call or return to clinic if he does not see an improvement in symptoms, or to seek the care of the closest emergency department if he worsens with the above plan.   Philis Fendt, MHS, PA-C [facility] 04/30/2017 11:04 AM

## 2017-05-02 LAB — MICROALBUMIN, URINE: Microalbumin, Urine: <3 ug/mL

## 2017-09-15 ENCOUNTER — Encounter: Payer: Self-pay | Admitting: Physician Assistant

## 2017-09-15 ENCOUNTER — Encounter (HOSPITAL_COMMUNITY): Payer: Self-pay | Admitting: *Deleted

## 2017-09-15 ENCOUNTER — Ambulatory Visit: Payer: Self-pay | Admitting: *Deleted

## 2017-09-15 ENCOUNTER — Other Ambulatory Visit: Payer: Self-pay

## 2017-09-15 ENCOUNTER — Emergency Department (HOSPITAL_COMMUNITY): Payer: 59

## 2017-09-15 ENCOUNTER — Emergency Department (HOSPITAL_COMMUNITY)
Admission: EM | Admit: 2017-09-15 | Discharge: 2017-09-15 | Disposition: A | Discharge status: Home / Self Care | Payer: 59 | Attending: Emergency Medicine | Admitting: Emergency Medicine

## 2017-09-15 ENCOUNTER — Ambulatory Visit (INDEPENDENT_AMBULATORY_CARE_PROVIDER_SITE_OTHER): Payer: 59

## 2017-09-15 ENCOUNTER — Ambulatory Visit: Payer: 59 | Admitting: Physician Assistant

## 2017-09-15 VITALS — BP 130/82 | HR 90 | Temp 98.4°F | Resp 20 | Ht 66.5 in | Wt 172.2 lb

## 2017-09-15 DIAGNOSIS — Z7982 Long term (current) use of aspirin: Secondary | ICD-10-CM | POA: Insufficient documentation

## 2017-09-15 DIAGNOSIS — R0602 Shortness of breath: Secondary | ICD-10-CM | POA: Diagnosis not present

## 2017-09-15 DIAGNOSIS — Z79899 Other long term (current) drug therapy: Secondary | ICD-10-CM | POA: Diagnosis not present

## 2017-09-15 DIAGNOSIS — R6883 Chills (without fever): Secondary | ICD-10-CM

## 2017-09-15 DIAGNOSIS — R071 Chest pain on breathing: Secondary | ICD-10-CM

## 2017-09-15 DIAGNOSIS — I10 Essential (primary) hypertension: Secondary | ICD-10-CM | POA: Diagnosis not present

## 2017-09-15 DIAGNOSIS — Z7984 Long term (current) use of oral hypoglycemic drugs: Secondary | ICD-10-CM | POA: Diagnosis not present

## 2017-09-15 DIAGNOSIS — R1013 Epigastric pain: Secondary | ICD-10-CM

## 2017-09-15 DIAGNOSIS — J45909 Unspecified asthma, uncomplicated: Secondary | ICD-10-CM | POA: Diagnosis not present

## 2017-09-15 DIAGNOSIS — R52 Pain, unspecified: Secondary | ICD-10-CM

## 2017-09-15 DIAGNOSIS — E119 Type 2 diabetes mellitus without complications: Secondary | ICD-10-CM | POA: Insufficient documentation

## 2017-09-15 DIAGNOSIS — R9431 Abnormal electrocardiogram [ECG] [EKG]: Secondary | ICD-10-CM | POA: Diagnosis not present

## 2017-09-15 DIAGNOSIS — R0902 Hypoxemia: Secondary | ICD-10-CM | POA: Diagnosis not present

## 2017-09-15 DIAGNOSIS — R079 Chest pain, unspecified: Secondary | ICD-10-CM | POA: Diagnosis present

## 2017-09-15 HISTORY — DX: Lyme disease, unspecified: A69.20

## 2017-09-15 LAB — POCT CBC
Granulocyte percent: 83.1 % — AB (ref 37–80)
HCT, POC: 42.6 % — AB (ref 43.5–53.7)
Hemoglobin: 14.1 g/dL (ref 14.1–18.1)
Lymph, poc: 1.6 (ref 0.6–3.4)
MCH, POC: 27.5 pg (ref 27–31.2)
MCHC: 33.1 g/dL (ref 31.8–35.4)
MCV: 82.9 fL (ref 80–97)
MID (cbc): 0.2 (ref 0–0.9)
MPV: 6.8 fL (ref 0–99.8)
POC Granulocyte: 8.8 — AB (ref 2–6.9)
POC LYMPH PERCENT: 15 % (ref 10–50)
POC MID %: 1.9 % (ref 0–12)
Platelet Count, POC: 352 10*3/uL (ref 142–424)
RBC: 5.14 M/uL (ref 4.69–6.13)
RDW, POC: 13.8 %
WBC: 10.6 10*3/uL — AB (ref 4.6–10.2)

## 2017-09-15 LAB — CBC
HCT: 40.7 % (ref 39.0–52.0)
HEMOGLOBIN: 13.7 g/dL (ref 13.0–17.0)
MCH: 28.1 pg (ref 26.0–34.0)
MCHC: 33.7 g/dL (ref 30.0–36.0)
MCV: 83.4 fL (ref 78.0–100.0)
PLATELETS: 296 10*3/uL (ref 150–400)
RBC: 4.88 MIL/uL (ref 4.22–5.81)
RDW: 13.7 % (ref 11.5–15.5)
WBC: 11 10*3/uL — ABNORMAL HIGH (ref 4.0–10.5)

## 2017-09-15 LAB — D-DIMER, QUANTITATIVE: D-Dimer, Quant: 0.27 ug/mL-FEU (ref 0.00–0.50)

## 2017-09-15 LAB — CMP14+EGFR
ALT: 26 IU/L (ref 0–44)
AST: 10 IU/L (ref 0–40)
Albumin/Globulin Ratio: 1.8 (ref 1.2–2.2)
Albumin: 4.7 g/dL (ref 3.5–5.5)
Alkaline Phosphatase: 105 IU/L (ref 39–117)
BUN/Creatinine Ratio: 10 (ref 9–20)
BUN: 10 mg/dL (ref 6–24)
Bilirubin Total: 0.6 mg/dL (ref 0.0–1.2)
CO2: 26 mmol/L (ref 20–29)
Calcium: 10 mg/dL (ref 8.7–10.2)
Chloride: 99 mmol/L (ref 96–106)
Creatinine, Ser: 1.01 mg/dL (ref 0.76–1.27)
GFR calc Af Amer: 98 mL/min/{1.73_m2} (ref >59)
GFR calc non Af Amer: 85 mL/min/{1.73_m2} (ref >59)
Globulin, Total: 2.6 g/dL (ref 1.5–4.5)
Glucose: 134 mg/dL — ABNORMAL HIGH (ref 65–99)
Potassium: 4.3 mmol/L (ref 3.5–5.2)
Sodium: 140 mmol/L (ref 134–144)
Total Protein: 7.3 g/dL (ref 6.0–8.5)

## 2017-09-15 LAB — BASIC METABOLIC PANEL
ANION GAP: 7 (ref 5–15)
BUN: 8 mg/dL (ref 6–20)
CO2: 30 mmol/L (ref 22–32)
Calcium: 9.5 mg/dL (ref 8.9–10.3)
Chloride: 101 mmol/L (ref 101–111)
Creatinine, Ser: 0.92 mg/dL (ref 0.61–1.24)
GLUCOSE: 123 mg/dL — AB (ref 65–99)
POTASSIUM: 4.4 mmol/L (ref 3.5–5.1)
Sodium: 138 mmol/L (ref 135–145)

## 2017-09-15 LAB — POC INFLUENZA A&B (BINAX/QUICKVUE)
Influenza A, POC: NEGATIVE
Influenza B, POC: NEGATIVE

## 2017-09-15 LAB — I-STAT TROPONIN, ED
TROPONIN I, POC: 0 ng/mL (ref 0.00–0.08)
Troponin i, poc: 0 ng/mL (ref 0.00–0.08)

## 2017-09-15 MED ORDER — IBUPROFEN 600 MG PO TABS: 600.0000 mg | ORAL_TABLET | Freq: Four times a day (QID) | ORAL | 0 refills | Status: DC | PRN

## 2017-09-15 MED ORDER — GI COCKTAIL ~~LOC~~
30.0000 mL | Freq: Once | ORAL | Status: AC
  Administered 2017-09-15: 30 mL via ORAL

## 2017-09-15 MED ORDER — KETOROLAC TROMETHAMINE 15 MG/ML IJ SOLN
15.0000 mg | Freq: Once | INTRAMUSCULAR | Status: AC
  Administered 2017-09-15: 15 mg via INTRAVENOUS
  Filled 2017-09-15: qty 1

## 2017-09-15 NOTE — Discharge Instructions (Signed)
Your workup has been very reassuring in the emergency department.  The prior provider was concerned that you may have pericarditis.  This is treated with anti-inflammatories.  Would encourage you taking Motrin around-the-clock.  May also take Tylenol as needed for pain.  It is also very important that you follow-up with your primary care doctor within 24-48 hours for recheck.  If you develop any worsening symptoms including shortness of breath, worsening chest pain, fevers return to the ED immediately.  Any signs of shortness of breath return to the ED immediately.  Also given you follow-up to the heart doctor.  Will discuss with your primary care doctor but she can also schedule appointment the cardiologist if your symptoms persist.

## 2017-09-15 NOTE — ED Notes (Signed)
Ambulated Pt in hallway. Oxygen dropped to 83% and pulse increased to 107. Pt didn't complain of shortness of breath.

## 2017-09-15 NOTE — ED Provider Notes (Signed)
Care assumed from previous provider PA Maczis. Please see their note for further details to include full history and physical. To summarize in short pt is a 51 year old Caucasian male past medical history significant for diabetes, viral pericarditis presents to the ED from urgent care for chest pain.  Chest pain seems pleuritic in nature and worse when sitting forward.  States it feels like his prior pericarditis in the past.. Case discussed, plan agreed upon.  On care handoff was awaiting for call from hospital doctor to admit given patient's hypoxia and tachycardia with ambulation.  Spoke with Dr. Jonnie Finner with hospital medicine who felt like we should re-ambulate patient.  If his oxygen saturations stayed normal he felt okay felt that patient can be discharged with outpatient follow-up.  She asked that if patient's saturations dropped to order a CTA of chest to rule out PE even though d-dimer was negative.  Did not feel that the patient would need admission.  I re-ambulated patient.  Oxygen saturations stayed 98-100%.  No tachycardia noted.  Patient looks well on ambulation.  Patient had no signs of distress.  No tachycardia noted.  Denies any chest pain.  I had spoke with patient concerning admission versus discharge home.  Patient felt like he can be managed in the outpatient setting and follow-up with his primary care doctor.  The patient was nontoxic-appearing.  Low suspicion for myocarditis.  Given history of pericarditis and clinical presentation and physical exam felt patient can be treated with NSAIDs.  Have also encouraged patient to follow-up with his primary care doctor in 24-48 hours.  Will send epic email to primary care to have patient follow-up in the office this week.  Have also given him [facility] for cardiology follow-up if symptoms persist.  Discussed with patient that he may benefit from an outpatient echo.  Have also insisted the patient that if he develops any shortness of breath,  worsening chest pain or fevers to return to the ED immediately.  Pt is hemodynamically stable, in NAD, & able to ambulate in the ED. Evaluation does not show pathology that would require ongoing emergent intervention or inpatient treatment. I explained the diagnosis to the patient. Pain has been managed & has no complaints prior to dc. Pt is comfortable with above plan and is stable for discharge at this time. All questions were answered prior to disposition. Strict return precautions for f/u to the ED were discussed. Encouraged follow up with PCP.  Pt dicussed with my attending Dr. Willa Frater who is agreeable with the above plan.  Updated hospitalist on disposition and he was agreeable with the above plan.  Patient is currently eating in the room and appears to be in no acute distress.       Doristine Devoid, PA-C 09/15/17 2230    Malvin Johns, MD 09/15/17 2236

## 2017-09-15 NOTE — ED Notes (Signed)
PA at bedside.

## 2017-09-15 NOTE — Telephone Encounter (Signed)
Pt complains of generalized muscle aches especially in bilateral arms and legs, and chest tightness when he takes a deep breath; pt also states that his head has a throbbing feeling if he moves it to fast; pt has no cough, runny nose, ear pain , or sinus pressure; pt says that he is able to drink and has no nausea; pt has taken ibuprofen and alka seltzer plus cold medicine with no relief in symptoms  Reason for Disposition . Diabetes mellitus or weak immune system (e.g., HIV positive, cancer chemo, splenectomy, chronic steroids)  Answer Assessment - Initial Assessment Questions 1. ONSET: "When did the muscle aches or body pains start?"      09/14/17 2. LOCATION: "What part of your body is hurting?" (e.g., entire body, arms, legs)      Arms and legs 3. SEVERITY: "How bad is the pain?" (Scale 1-10; or mild, moderate, severe)   - MILD (1-3): doesn't interfere with normal activities    - MODERATE (4-7): interferes with normal activities or awakens from sleep    - SEVERE (8-10):  excruciating pain, unable to do any normal activities      Moderate 4-7 out of 10 4. CAUSE: "What do you think is causing the pains?"     Unsure  5. FEVER: "Have you been having fever?"     Not checked 6. OTHER SYMPTOMS: "Do you have any other symptoms?" (e.g., chest pain, weakness, rash, cold or flu symptoms, weight loss)     Chills, chest tightness when he takes a deep breath 7. PREGNANCY: "Is there any chance you are pregnant?" "When was your last menstrual period?"     n/a 8. TRAVEL: "Have you traveled out of the country in the last month?" (e.g., travel history, exposures)     no  Protocols used: MUSCLE ACHES AND BODY PAIN-A-AH

## 2017-09-15 NOTE — Progress Notes (Signed)
[name] [name]  MRN: [id number] DOB: 1966-06-13  PCP: [facility]  Subjective:  Pt is a 51 year old male PMH HTN, DM who presents to clinic for body aches and pain with inspiration.  Chicken tortilla soup yesterday for lunch and symptoms started a few hours later. He took 2 tums, did not help. He took ranitadine last night before bed - did not help.  Chills and sweats last night - woke up every couple hours.  5/10 pain. Pain is located lower mid-chest.  Pain is worse with inspiration and when he lays down. Endorses some palpitations.  Denies cough, hemoptysis, fever, n/v, neck pain, arm pain, back pain, abdominal pain.  Denies recent illness.  Last bowel movement this morning. No change.  No change in appetite.   Review of Systems  Constitutional: Positive for chills and diaphoresis. Negative for fatigue and fever.  Respiratory: Negative for cough, shortness of breath and wheezing.   Cardiovascular: Positive for chest pain and palpitations. Negative for leg swelling.  Gastrointestinal: Negative for abdominal pain, constipation, diarrhea, nausea and vomiting.  Psychiatric/Behavioral: Positive for sleep disturbance.    Patient Active Problem List   Diagnosis Date Noted  . Diabetes mellitus type 2, controlled, without complications ([place]) 92/92/4462  . Hx of viral pericarditis 07/24/2014  . ALLERGIC RHINITIS 02/25/2008    Current Outpatient Medications on File Prior to Visit  Medication Sig Dispense Refill  . aspirin 81 MG tablet Take 81 mg by mouth daily.    Marland Kitchen atorvastatin (LIPITOR) 40 MG tablet Take 1 tablet (40 mg total) by mouth daily. Take one half tab for 14 days then start the full tab. 90 tablet 3  . Coenzyme Q10 (CO Q 10 PO) Take by mouth.    . Fish Oil-Cholecalciferol (FISH OIL + D3 PO) Take by mouth.    . loratadine (CLARITIN) 10 MG tablet Take 10 mg by mouth daily.    . metFORMIN (GLUCOPHAGE) 500 MG tablet Take 1 tablet (500 mg total) by mouth daily with  breakfast. 90 tablet 1  . Multiple Vitamin (MULTIVITAMIN) tablet Take 1 tablet by mouth daily. Reported on 12/20/2015     No current facility-administered medications on file prior to visit.     No Known Allergies   Objective:  BP 130/82   Pulse 90   Temp 98.4 F (36.9 C) (Oral)   Resp 20   Ht 5' 6.5" (1.689 m)   Wt 172 lb 3.2 oz (78.1 kg)   SpO2 96%   BMI 27.38 kg/m   Physical Exam  Constitutional: He is oriented to person, place, and time and well-developed, well-nourished, and in no distress. No distress.  Cardiovascular: Normal rate, regular rhythm, S1 normal, S2 normal, normal heart sounds and normal pulses. Exam reveals no gallop and no friction rub.  No murmur heard. Pulmonary/Chest: Effort normal and breath sounds normal.  Pain worse with bending forward  Abdominal: Soft. Normal appearance and bowel sounds are normal. There is tenderness (mild) in the epigastric area.  Neurological: He is alert and oriented to person, place, and time. GCS score is 15.  Skin: Skin is warm and dry.  Psychiatric: Mood, memory, affect and judgment normal.  Vitals reviewed.   Results for orders placed or performed in visit on 09/15/17  POC Influenza A&B(BINAX/QUICKVUE)  Result Value Ref [facility]   Influenza A, POC Negative Negative   Influenza B, POC Negative Negative  POCT CBC  Result Value Ref [facility]   WBC 10.6 (A) 4.6 -  10.2 K/uL   Lymph, poc 1.6 0.6 - 3.4   POC LYMPH PERCENT 15.0 10 - 50 %L   MID (cbc) 0.2 0 - 0.9   POC MID % 1.9 0 - 12 %M   POC Granulocyte 8.8 (A) 2 - 6.9   Granulocyte percent 83.1 (A) 37 - 80 %G   RBC 5.14 4.69 - 6.13 M/uL   Hemoglobin 14.1 14.1 - 18.1 g/dL   HCT, POC 42.6 (A) 43.5 - 53.7 %   MCV 82.9 80 - 97 fL   MCH, POC 27.5 27 - 31.2 pg   MCHC 33.1 31.8 - 35.4 g/dL   RDW, POC 13.8 %   Platelet Count, POC 352 142 - 424 K/uL   MPV 6.8 0 - 99.8 fL   Dg Chest 2 View  Result Date: 09/15/2017 CLINICAL DATA:  Chest pain with inspiration EXAM: CHEST  2 VIEW  COMPARISON:  03/29/2015 FINDINGS: Bibasilar atelectasis. Heart is borderline in size. No effusions or acute bony abnormality. IMPRESSION: Borderline heart size.  Bibasilar atelectasis. Electronically Signed   By: Rolm Baptise M.D.   On: 09/15/2017 10:35   Dg Abd 2 Views  Result Date: 09/15/2017 CLINICAL DATA:  Epigastric pain EXAM: ABDOMEN - 2 VIEW COMPARISON:  CT 11/21/2014 FINDINGS: Moderate stool burden in the colon. There is normal bowel gas pattern. No free air. No organomegaly or suspicious calcification. No acute bony abnormality. Mild gaseous distention of the stomach. IMPRESSION: Moderate stool burden. Mild gaseous distention of the stomach. Electronically Signed   By: Rolm Baptise M.D.   On: 09/15/2017 10:36   EKG shows diffuse ST-elevation. Assessment and Plan :  This case was discussed with Dr. Carlota Raspberry.  1. Chest pain on breathing 2. EKG abnormalities 3. Chills - EKG 12-Lead - gi cocktail (Maalox,Lidocaine,Donnatal) - DG Chest 2 View; Future - POCT CBC - CMP14+EGFR - Pt c/o 2 days pain c inspiration, pain worse with bending forward, diffuse st elevations on EKG, mild leukocytosis. Vitals signs are stable. Concern for pericarditis. Discussed findings with pt, he states he has had this problem about 5 years ago. He does not appear acutely ill. Advised pt to present to emergency department via personal vehicle. He understands and agrees with plan.    4. Body aches - POC Influenza A&B(BINAX/QUICKVUE)  5. Epigastric pain - H. pylori breath test - DG Abd 2 Views; Future   Mercer Pod, PA-C  [facility] 09/15/2017 9:42 AM

## 2017-09-15 NOTE — ED Notes (Signed)
Pt ambulated in hallway w/o diff. Showed no s/s of sob and denied same. Ask pt how he felt while walking and he stated "fine". When back in room pt set on edge of bed and appeared to be ready for discharge. No color change, diaphoreses, or wheezing noted.

## 2017-09-15 NOTE — Patient Instructions (Addendum)
Your EKG shows changes from your last tracing 1 year ago. I am concerned for pericarditis. Please go to the emergency department for further evaluation.   Thank you for coming in today. I hope you feel we met your needs.  Feel free to call PCP if you have any questions or further requests.  Please consider signing up for MyChart if you do not already have it, as this is a great way to communicate with me.  Best,  Whitney McVey, PA-C  Pericarditis Pericarditis is swelling and irritation (inflammation) of your pericardium. The pericardium is a thin, double-layered, fluid-filled sac that surrounds your heart. The pericardium protects and holds your heart in your chest cavity. Inflammation of your pericardium can cause rubbing (friction) between the two layers when your heart beats. Fluid may build up between the layers of the sac (pericardial effusion). Different types of pericarditis include:  Acute pericarditis. Inflammation develops suddenly and causes pericardial effusion.  Chronic pericarditis. Inflammation may develop gradually, or it may continue after acute pericarditis and last longer than 6 months.  Constrictive pericarditis. The layers of the pericardium stiffen and develop scar tissue. The scar tissue thickens and sticks together. This makes it difficult for the heart to pump and to work as it normally does. This type is rare.  In most cases, pericarditis is acute and not serious. Chronic pericarditis and constrictive pericarditis may be more serious and may require treatment. What are the causes? Often, the cause of pericarditis is not known.If a cause is found, the cause may be:  A viral infection.  A heart attack (myocardial infarction).  Open-heart surgery (coronary artery bypass graft surgery).  Chest injury.  Autoimmune conditions, such as lupus or rheumatoid arthritis.  Kidney failure.  Low-functioning thyroid gland (hypothyroidism).  Cancer from another part  of the body that has spread (metastasized) to the pericardium.  Radiation treatment.  Certain medicines, including some seizure medicines, blood thinners, heart medicines, and antibiotics.  A bacterial or fungal infection. This cause is less common.  What increases the risk? The following factors may increase your risk of pericarditis:  Being male.  Being 21-4 years old.  Having had pericarditis before.  Having had a recent upper respiratory tract infection.  What are the signs or symptoms? The most common symptom of pericarditis is chest pain. This pain may:  Be in the center of your chest or the left side of your chest.  Not go away with rest.  Last for many hours or days.  Worsen when you lie down and go away when you sit up and lean forward.  Worsen when you swallow.  Move to your back, neck, or shoulder.  Other symptoms may include:  A chronic, dry cough.  Heart palpitations. These may feel like rapid, fluttering, or pounding heartbeats.  Dizziness or fainting.  Tiredness or fatigue.  Fever.  Rapid breathing.  Shortness of breath when lying down.  How is this diagnosed? This condition is diagnosed with a medical history, physical exam, and diagnostic tests. During your physical exam, your health care provider will listen for friction while your heart beats (pericardial rub). You may also have tests, including:  Blood work to look for signs of infection and inflammation.  Electrocardiogram (ECG).  Echocardiogram.  CT scan.  MRI.  Culture of pericardial fluid.  A tissue sample (biopsy) of the pericardium.  If tests show that you may have constrictive pericarditis, you may have a procedure (cardiac catheterization) to confirm this diagnosis. How is this  treated? Treatment for this condition depends on the cause and type of pericarditis. In most cases, acute pericarditis will clear up on its own within 10 days. Treatment for other types of  pericarditis may include:  Medicines, such as: ? NSAIDs for pain and inflammation. ? Steroids to reduce inflammation. ? Colchicine to relieve pain and inflammation.  A procedure to remove fluid using a needle (pericardiocentesis) if pericardial effusion puts pressure on the heart.  Surgery to remove part of the pericardium if constrictive pericarditis develops.  If another condition is causing your pericarditis, you may need treatment for that underlying condition. Follow these instructions at home:  Do not use tobacco products, including cigarettes, chewing tobacco, or e-cigarettes. If you need help quitting, ask your health care provider.  Maintain a healthy weight.  Follow an exercise program as told by your health care provider. You may need to limit your exercise until your symptoms go away.  Eat a heart-healthy diet. A registered dietitian can help you to learn about healthy food choices.  Take over-the-counter and prescription medicines only as told by your health care provider. Keep a list of all of your medicines with you at all times. For each medicine, include information about the name, the dosage, how often you take it, and how you take it.  Keep all follow-up visits as told by your health care provider. This is important. Contact a health care provider if:  You continue to have symptoms of pericarditis.  You develop new symptoms of pericarditis.  Your symptoms get worse. Get help right away if:  You have worsening chest pain and difficulty breathing. These symptoms may represent a serious problem that is an emergency. Do not wait to see if the symptoms will go away. Get medical help right away. Call your local emergency services (911 in the [place] Do not drive yourself to the hospital. This information is not intended to replace advice given to you by your health care provider. Make sure you discuss any questions you have with your health care provider. Document  Released: 03/25/2001 Document Revised: 03/03/2016 Document Reviewed: 04/11/2015 Elsevier Interactive Patient Education  2018 [employer].  IF you received an x-ray today, you will receive an invoice from [facility]. Please contact [facility] at [phone] with questions or concerns regarding your invoice.   IF you received labwork today, you will receive an invoice from [place]. Please contact [facility] at [phone] with questions or concerns regarding your invoice.   Our billing staff will not be able to assist you with questions regarding bills from these companies.  You will be contacted with the lab results as soon as they are available. The fastest way to get your results is to activate your My Chart account. Instructions are located on the last page of this paperwork. If you have not heard from [place] regarding the results in 2 weeks, please contact this office.

## 2017-09-15 NOTE — ED Triage Notes (Addendum)
Pt reports onset yesterday of left and mid chest pain with sob. Went to [place] and sent here for further eval due to abnormal ekg and hx of pericarditis. Pain increases with deep breaths and bending forward.

## 2017-09-15 NOTE — ED Provider Notes (Signed)
[facility] EMERGENCY DEPARTMENT Provider Note   CSN: [id number] Arrival date & time: 09/15/17  1224     History   Chief Complaint Chief Complaint  Patient presents with  . Chest Pain  . Abnormal ECG    HPI [name] [name] is a 51 y.o. male with a history of HTN, DM2, & viral pericarditis sent over from [facility], who presents to the ED today for chest pain.  Patient states that yesterday around lunchtime he ate a chicken tortilla soup and a few hours later he started getting a pain in his left and central chest without radiation into his neck, jaw, shoulders or back.  He describes the pain as a sharp pain that is worse with deep inspiration and also when leaning forward.  There is associated shortness of breath is increased with exertion and when lying down.  He originally thought the pain was due to acid reflux so he is taken to Tums and ranitidine last night which did not help.  He also is developing body aches and awoke with chills and sweats every few hours.  He took Alka-Seltzer for this without any improvement.  Pain has been constant since onset and has not changed in characteristic or severity.  Patient was seen at [facility] primary care for this where he had a negative flu test.  The patient does note that he had a 8-hour car trip to an area just above [place] this past weekend. He denies other risk factors for DVT/PE including testosterone use, recent surgery, trauma, immobilization, smoking (patient is a never smoker), previous blood clot, cough, hemoptysis, cancer, lower extremity pain or swelling, or family history of bleeding/clotting disorder. The patient notes no recent illnesses but has been treated for lyme's disease twice, once 10 years ago and once 6 months ago. No cocaine or other illicit drug use. The patient does not have a family history of early cardiac death. The patient does not have a cardiologist.  No history of cardiac echocardiogram, stress  test or cardiac catheterization.   HPI  Past Medical History:  Diagnosis Date  . Allergy   . Arthritis   . Asthma   . Heart murmur   . Hypertension   . Kidney stones   . Lyme disease   . Pericarditis   . Pneumonia     Patient Active Problem List   Diagnosis Date Noted  . Diabetes mellitus type 2, controlled, without complications ([place]) 37/16/9678  . Hx of viral pericarditis 07/24/2014  . ALLERGIC RHINITIS 02/25/2008    Past Surgical History:  Procedure Laterality Date  . HERNIA REPAIR         Home Medications    Prior to Admission medications   Medication Sig Start Date End Date Taking? Authorizing Provider  aspirin 81 MG tablet Take 81 mg by mouth daily.    [provider]  atorvastatin (LIPITOR) 40 MG tablet Take 1 tablet (40 mg total) by mouth daily. Take one half tab for 14 days then start the full tab. 11/24/16   Tereasa Coop, PA-C  Coenzyme Q10 (CO Q 10 PO) Take by mouth.    [provider]  Fish Oil-Cholecalciferol (FISH OIL + D3 PO) Take by mouth.    [provider]  loratadine (CLARITIN) 10 MG tablet Take 10 mg by mouth daily.    [provider]  metFORMIN (GLUCOPHAGE) 500 MG tablet Take 1 tablet (500 mg total) by mouth daily with breakfast. 04/30/17   Carlis Abbott,  Audrie Lia, PA-C  Multiple Vitamin (MULTIVITAMIN) tablet Take 1 tablet by mouth daily. Reported on 12/20/2015    [provider]    Family History Family History  Problem Relation Age of Onset  . Diabetes Mother   . Cancer Mother        CHF  . Heart disease Mother        CHF  . Asthma Mother   . Lung cancer Mother        smoked  . Cancer Father        prostate  . Diabetes Father   . Kidney disease Father        CANCER  . Stroke Daughter   . Diabetes Maternal Grandmother   . Heart disease Maternal Grandfather        HEART ATTACK  . Colon cancer Neg Hx     Social History Social History   Tobacco Use  . Smoking status: Never Smoker  .  Smokeless tobacco: Never Used  Substance Use Topics  . Alcohol use: No    Alcohol/week: 0.0 oz  . Drug use: No     Allergies   Patient has no known allergies.   Review of Systems Review of Systems  All other systems reviewed and are negative.    Physical Exam Updated Vital Signs BP (!) 142/82   Pulse 65   Temp 98.7 F (37.1 C) (Oral)   Resp 18   SpO2 97%   Physical Exam  Constitutional: He appears well-developed and well-nourished.  HENT:  Head: Normocephalic and atraumatic.  Right Ear: External ear normal.  Left Ear: External ear normal.  Nose: Nose normal.  Mouth/Throat: Uvula is midline, oropharynx is clear and moist and mucous membranes are normal. No tonsillar exudate.  Eyes: Pupils are equal, round, and reactive to light. Right eye exhibits no discharge. Left eye exhibits no discharge. No scleral icterus.  Neck: Trachea normal. Neck supple. No JVD present. No spinous process tenderness present. Carotid bruit is not present. No neck rigidity. Normal range of motion present.  No meningismus  Cardiovascular: Normal rate, regular rhythm and intact distal pulses. Exam reveals no distant heart sounds and no friction rub.  No murmur heard. Pulses:      Radial pulses are 2+ on the right side, and 2+ on the left side.       Dorsalis pedis pulses are 2+ on the right side, and 2+ on the left side.       Posterior tibial pulses are 2+ on the right side, and 2+ on the left side.  No lower extremity swelling or edema. Calves symmetric in size bilaterally. No TTP of calves. Patient pain worse when leaning forward.   Pulmonary/Chest: Effort normal and breath sounds normal. He exhibits no tenderness.  Abdominal: Soft. Bowel sounds are normal. There is no tenderness. There is no rebound and no guarding.  Musculoskeletal: He exhibits no edema.  Lymphadenopathy:    He has no cervical adenopathy.  Neurological: He is alert.  Skin: Skin is warm and dry. Capillary refill takes less  than 2 seconds. No petechiae, no purpura and no rash noted. He is not diaphoretic. No erythema.  Psychiatric: He has a normal mood and affect.  Nursing note and vitals reviewed.    ED Treatments / Results  Labs (all labs ordered are listed, but only abnormal results are displayed) Labs Reviewed  BASIC METABOLIC PANEL - Abnormal; Notable for the following components:      Result Value  Glucose, Bld 123 (*)    All other components within normal limits  CBC - Abnormal; Notable for the following components:   WBC 11.0 (*)    All other components within normal limits  D-DIMER, QUANTITATIVE (NOT AT [facility])  I-STAT TROPONIN, ED  I-STAT TROPONIN, ED    EKG  EKG Interpretation  Date/Time:  Tuesday September 15 2017 12:56:53 EST Ventricular Rate:  70 PR Interval:  140 QRS Duration: 92 QT Interval:  386 QTC Calculation: 416 R Axis:   52 Text Interpretation:  Normal sinus rhythm Early repolarization Normal ECG since last tracing no significant change Confirmed by Malvin Johns [phone]) on 09/15/2017 6:49:01 PM       Radiology Dg Chest 2 View  Result Date: 09/15/2017 CLINICAL DATA:  Left-sided chest pain EXAM: CHEST  2 VIEW COMPARISON:  09/15/2017 FINDINGS: Chronic borderline cardiomegaly accentuated by low volumes and probable mediastinal fat pad. There is interstitial crowding/atelectasis. No air bronchogram, generalized Kerley lines, effusion, or pneumothorax. No acute osseous finding. Remote left rib fractures. IMPRESSION: Low volume chest with mild atelectasis. No change compared to earlier today. Electronically Signed   By: Monte Fantasia M.D.   On: 09/15/2017 13:27   Dg Chest 2 View  Result Date: 09/15/2017 CLINICAL DATA:  Chest pain with inspiration EXAM: CHEST  2 VIEW COMPARISON:  03/29/2015 FINDINGS: Bibasilar atelectasis. Heart is borderline in size. No effusions or acute bony abnormality. IMPRESSION: Borderline heart size.  Bibasilar atelectasis. Electronically Signed   By:  Rolm Baptise M.D.   On: 09/15/2017 10:35   Dg Abd 2 Views  Result Date: 09/15/2017 CLINICAL DATA:  Epigastric pain EXAM: ABDOMEN - 2 VIEW COMPARISON:  CT 11/21/2014 FINDINGS: Moderate stool burden in the colon. There is normal bowel gas pattern. No free air. No organomegaly or suspicious calcification. No acute bony abnormality. Mild gaseous distention of the stomach. IMPRESSION: Moderate stool burden. Mild gaseous distention of the stomach. Electronically Signed   By: Rolm Baptise M.D.   On: 09/15/2017 10:36    Procedures Procedures (including critical care time)  Medications Ordered in ED Medications  ketorolac (TORADOL) 15 MG/ML injection 15 mg (15 mg Intravenous Given 09/15/17 1924)     Initial Impression / Assessment and Plan / ED Course  I have reviewed the triage vital signs and the nursing notes.  Pertinent labs & imaging results that were available during my care of the patient were reviewed by me and considered in my medical decision making (see chart for details).     51 y.o. male with a history of HTN, DM2, & viral pericarditis presenting with pleuritic chest pain that is worse when leaning forward and associated with shortness of breath. Seen and evaluated by PCP today with negative flu test. Sent over for chest pain.   Vital signs are reassuring on presentation.  Patient is without tachycardia, tachypnea, hypoxia.  His blood pressure is reassuring.  He is afebrile.  Patient is non-ill appearing.  He has no increased work of breathing and is without accessory muscle use.  On exam the patient has clear lungs to auscultation bilaterally.  Heart is regular rate and rhythm.  There is no rubs auscultated.  Heart does not appear to be distant.  He does note that his pain is increased when he leans forward.  No tenderness palpation of the chest. No lower extremity swelling.  Initial labs, ECG, chest x-ray and troponin done in triage.  Initial troponin  0.00.  BMP without any  electrolyte abnormalities.  Glucose noted to be 123.  No anion gap acidosis.  No evidence of acute kidney injury.  CBC with mildly leukocytosis of 11.  No anemia.  Platelets within normal limits.  Chest x-ray shows chronic borderline cardiomegaly.  Will cycle troponin.  Will add on d-dimer as patient did have 8-hour car trip last weekend and cannot rule out with [place]. Patient given Toradol.   Patient D-Dimer and Tn wnl.  Will ambulate patient to make sure her O2 sats did not drop.  He is maintaining 95% on room air while at rest. Heart score 2.   His oxygen saturation dropped to 83% while ambulating and increase of HR to 107.  He also noted that he was short of breath after talking to him about ambulatory test.  Will call for admission due to hypoxia and likely pericarditis feel the patient will benefit from echocardiogram. Case signed out to Melina Schools, PA-C with call from hospitalist pending. Patient and wife made aware of plan and are in agreement. Patient case discussed with Dr. Tamera Punt who is in agreement with plan.  Final Clinical Impressions(s) / ED Diagnoses   Final diagnoses:  Hypoxia  Chest pain on breathing  Shortness of breath    ED Discharge Orders    None       Lorelle Gibbs 09/15/17 2104    Malvin Johns, MD 09/15/17 2236

## 2017-09-16 LAB — H. PYLORI BREATH TEST: H pylori Breath Test: NEGATIVE

## 2017-09-17 ENCOUNTER — Encounter: Payer: Self-pay | Admitting: Physician Assistant

## 2017-09-17 ENCOUNTER — Ambulatory Visit: Payer: 59 | Admitting: Physician Assistant

## 2017-09-17 VITALS — BP 128/78 | HR 70 | Temp 97.9°F | Resp 16 | Ht 68.0 in | Wt 174.6 lb

## 2017-09-17 DIAGNOSIS — R079 Chest pain, unspecified: Secondary | ICD-10-CM

## 2017-09-17 LAB — POCT CBC
GRANULOCYTE PERCENT: 80 % (ref 37–80)
HCT, POC: 41 % — AB (ref 43.5–53.7)
Hemoglobin: 13.6 g/dL — AB (ref 14.1–18.1)
Lymph, poc: 1.4 (ref 0.6–3.4)
MCH: 27.9 pg (ref 27–31.2)
MCHC: 33.1 g/dL (ref 31.8–35.4)
MCV: 84.4 fL (ref 80–97)
MID (CBC): 0.9 (ref 0–0.9)
MPV: 7.3 fL (ref 0–99.8)
POC GRANULOCYTE: 9 — AB (ref 2–6.9)
POC LYMPH PERCENT: 12.3 %L (ref 10–50)
POC MID %: 7.7 % (ref 0–12)
Platelet Count, POC: 361 10*3/uL (ref 142–424)
RBC: 4.86 M/uL (ref 4.69–6.13)
RDW, POC: 13.6 %
WBC: 11.2 10*3/uL — AB (ref 4.6–10.2)

## 2017-09-17 LAB — POCT GLYCOSYLATED HEMOGLOBIN (HGB A1C): HEMOGLOBIN A1C: 6.4

## 2017-09-17 MED ORDER — NAPROXEN 500 MG PO TABS: 500.0000 mg | ORAL_TABLET | Freq: Two times a day (BID) | ORAL | 0 refills | Status: DC

## 2017-09-17 MED ORDER — COLCHICINE 0.6 MG PO TABS: 0.3000 mg | ORAL_TABLET | Freq: Every day | ORAL | 0 refills | Status: DC

## 2017-09-17 MED ORDER — PANTOPRAZOLE SODIUM 40 MG PO TBEC: 40.0000 mg | DELAYED_RELEASE_TABLET | Freq: Every day | ORAL | 1 refills | Status: DC

## 2017-09-17 NOTE — Patient Instructions (Addendum)
I want to see you back in about 1 month.  You will have seen someone at [facility] by that time and they will be in touch with me.     IF you received an x-ray today, you will receive an invoice from [facility]. Please contact [facility] at [phone] with questions or concerns regarding your invoice.   IF you received labwork today, you will receive an invoice from [facility]. Please contact [facility] at [phone] with questions or concerns regarding your invoice.   Our billing staff will not be able to assist you with questions regarding bills from these companies.  You will be contacted with the lab results as soon as they are available. The fastest way to get your results is to activate your My Chart account. Instructions are located on the last page of this paperwork. If you have not heard from [place] regarding the results in 2 weeks, please contact this office.

## 2017-09-17 NOTE — Progress Notes (Signed)
09/17/2017 3:19 PM   DOB: 11-20-1965 / MRN: [id number]  SUBJECTIVE:  [name] [name] is a 51 y.o. male presenting for ED follow up.  Presented two days ago here for chest and was noted to have some mild EKG changes and descreased O2 sats.  Was referred to the ED for further workup which was negative acutely.  He was prescribed Ibuprofen 800 which he has been taking TID and this has been helping him greatly.  He has a history of DM2 and this has been tightly controlled in the past.  He is taking a statin along with ASA for primary prevention of CAD. He does complain of some near SOB with exertion that has been occurring over the last 2-3 months and has actually stopped going to the gym as a result.   He has No Known Allergies.   He  has a past medical history of Allergy, Arthritis, Asthma, Heart murmur, Hypertension, Kidney stones, Lyme disease, Pericarditis, and Pneumonia.    He  reports that  has never smoked. he has never used smokeless tobacco. He reports that he does not drink alcohol or use drugs. He  reports that he currently engages in sexual activity. The patient  has a past surgical history that includes Hernia repair.  His family history includes Asthma in his mother; Cancer in his father and mother; Diabetes in his father, maternal grandmother, and mother; Heart disease in his maternal grandfather and mother; Kidney disease in his father; Lung cancer in his mother; Stroke in his daughter.  Review of Systems  Constitutional: Negative for chills, diaphoresis and fever.  HENT: Negative for sore throat.   Respiratory: Positive for shortness of breath (improving). Negative for cough, hemoptysis, sputum production and wheezing.   Cardiovascular: Positive for chest pain (improving). Negative for orthopnea and leg swelling.  Gastrointestinal: Positive for heartburn. Negative for nausea.  Genitourinary: Negative for dysuria.  Skin: Negative for rash.  Neurological: Negative for dizziness.     The problem list and medications were reviewed and updated by myself where necessary and exist elsewhere in the encounter.   OBJECTIVE:  BP 128/78 (BP Location: Right Arm, Patient Position: Sitting, Cuff Size: Normal)   Pulse 70   Temp 97.9 F (36.6 C) (Oral)   Resp 16   Ht 5\' 8"  (1.727 m)   Wt 174 lb 9.6 oz (79.2 kg)   SpO2 97%   BMI 26.55 kg/m   Wt Readings from Last 3 Encounters:  09/17/17 174 lb 9.6 oz (79.2 kg)  09/15/17 172 lb 3.2 oz (78.1 kg)  04/30/17 167 lb (75.8 kg)   Temp Readings from Last 3 Encounters:  09/17/17 97.9 F (36.6 C) (Oral)  09/15/17 98.7 F (37.1 C) (Oral)  09/15/17 98.4 F (36.9 C) (Oral)   BP Readings from Last 3 Encounters:  09/17/17 128/78  09/15/17 136/79  09/15/17 130/82   Pulse Readings from Last 3 Encounters:  09/17/17 70  09/15/17 (!) 57  09/15/17 90     Physical Exam  Constitutional: He appears well-developed. He is active and cooperative.  Non-toxic appearance.  Cardiovascular: Normal rate, regular rhythm, S1 normal, S2 normal, normal heart sounds, intact distal pulses and normal pulses. Exam reveals no gallop and no friction rub.  No murmur heard. Pulmonary/Chest: Effort normal. No stridor. No tachypnea. No respiratory distress. He has no wheezes. He has no rales.  Abdominal: He exhibits no distension.  Musculoskeletal: He exhibits no edema, tenderness or deformity.  Neurological: He is alert.  Skin:  Skin is warm and dry. He is not diaphoretic. No pallor.  Vitals reviewed.   Results for orders placed or performed in visit on 09/17/17 (from the past 72 hour(s))  POCT CBC     Status: Abnormal   Collection Time: 09/17/17  2:51 PM  Result Value Ref Range   WBC 11.2 (A) 4.6 - 10.2 K/uL   Lymph, poc 1.4 0.6 - 3.4   POC LYMPH PERCENT 12.3 10 - 50 %L   MID (cbc) 0.9 0 - 0.9   POC MID % 7.7 0 - 12 %M   POC Granulocyte 9.0 (A) 2 - 6.9   Granulocyte percent 80.0 37 - 80 %G   RBC 4.86 4.69 - 6.13 M/uL   Hemoglobin 13.6  (A) 14.1 - 18.1 g/dL   HCT, POC 41.0 (A) 43.5 - 53.7 %   MCV 84.4 80 - 97 fL   MCH, POC 27.9 27 - 31.2 pg   MCHC 33.1 31.8 - 35.4 g/dL   RDW, POC 13.6 %   Platelet Count, POC 361 142 - 424 K/uL   MPV 7.3 0 - 99.8 fL  POCT glycosylated hemoglobin (Hb A1C)     Status: None   Collection Time: 09/17/17  2:55 PM  Result Value Ref Range   Hemoglobin A1C 6.4     No results found.  ASSESSMENT AND PLAN:  [place] was seen today for follow-up.  Diagnoses and all orders for this visit:  Chest pain, unspecified type: Likely pericarditis.  EKG appears to be improving with respect to ST elevation and looks normal today.  Starting low dose colchicine, naprosyn 500 bid, and will get him is with cardiology given his complaints of new SOB over the last 2-3 months.  Dr. Nadyne Coombes made aware via epic messaging and as always I greatly appreciate his care. Will plan to follow a sed rate.  -     Sedimentation Rate -     POCT glycosylated hemoglobin (Hb A1C) -     EKG 12-Lead -     POCT CBC -     colchicine 0.6 MG tablet; Take 0.5 tablets (0.3 mg total) by mouth daily. -     pantoprazole (PROTONIX) 40 MG tablet; Take 1 tablet (40 mg total) by mouth daily. -     naproxen (NAPROSYN) 500 MG tablet; Take 1 tablet (500 mg total) by mouth 2 (two) times daily with a meal. -     Ambulatory referral to Cardiology    The patient is advised to call or return to clinic if he does not see an improvement in symptoms, or to seek the care of the closest emergency department if he worsens with the above plan.   Philis Fendt, MHS, PA-C [facility] 09/17/2017 3:19 PM

## 2017-09-18 LAB — SEDIMENTATION RATE: SED RATE: 7 mm/h (ref 0–30)

## 2017-11-09 ENCOUNTER — Encounter: Payer: Self-pay | Admitting: Physician Assistant

## 2017-11-09 NOTE — Telephone Encounter (Signed)
Could you please have one of your staff fax over your most recent labs on Mr. Voland.  No hurry.  Thank you for taking care of him. Philis Fendt, MS, PA-C 10:56 AM, 11/09/2017

## 2017-11-25 ENCOUNTER — Encounter: Payer: Self-pay | Admitting: Physician Assistant

## 2017-11-25 ENCOUNTER — Ambulatory Visit: Payer: 59 | Admitting: Physician Assistant

## 2017-11-25 VITALS — BP 156/87 | HR 69 | Temp 97.5°F | Resp 16 | Ht 68.0 in | Wt 176.0 lb

## 2017-11-25 DIAGNOSIS — K219 Gastro-esophageal reflux disease without esophagitis: Secondary | ICD-10-CM

## 2017-11-25 DIAGNOSIS — I319 Disease of pericardium, unspecified: Secondary | ICD-10-CM | POA: Diagnosis not present

## 2017-11-25 DIAGNOSIS — R450 Nervousness: Secondary | ICD-10-CM

## 2017-11-25 DIAGNOSIS — E119 Type 2 diabetes mellitus without complications: Secondary | ICD-10-CM

## 2017-11-25 DIAGNOSIS — I10 Essential (primary) hypertension: Secondary | ICD-10-CM

## 2017-11-25 MED ORDER — PANTOPRAZOLE SODIUM 40 MG PO TBEC: 40.0000 mg | DELAYED_RELEASE_TABLET | Freq: Every day | ORAL | 3 refills | Status: DC | PRN

## 2017-11-25 MED ORDER — ATORVASTATIN CALCIUM 40 MG PO TABS: 40.0000 mg | ORAL_TABLET | Freq: Every day | ORAL | 3 refills | Status: DC

## 2017-11-25 MED ORDER — LISINOPRIL 20 MG PO TABS
20.0000 mg | ORAL_TABLET | Freq: Every day | ORAL | 3 refills | Status: DC
Start: 2017-11-25 — End: 2018-12-24

## 2017-11-25 MED ORDER — ESCITALOPRAM OXALATE 5 MG PO TABS: 2.5000 mg | ORAL_TABLET | Freq: Every day | ORAL | 3 refills | Status: DC

## 2017-11-25 MED ORDER — METFORMIN HCL 500 MG PO TABS: 500.0000 mg | ORAL_TABLET | Freq: Every day | ORAL | 3 refills | Status: DC

## 2017-11-25 MED ORDER — COLCHICINE 0.6 MG PO TABS: 0.6000 mg | ORAL_TABLET | Freq: Every day | ORAL | 3 refills | Status: DC

## 2017-11-25 NOTE — Patient Instructions (Addendum)
Continue all medications as directed, return to see [place] in 3 months.   IF you received an x-ray today, you will receive an invoice from [facility]. Please contact [facility] at [phone] with questions or concerns regarding your invoice.   IF you received labwork today, you will receive an invoice from [place] [facility]. Please contact [facility] at [phone] with questions or concerns regarding your invoice.   Our billing staff will not be able to assist you with questions regarding bills from these companies.  You will be contacted with the lab results as soon as they are available. The fastest way to get your results is to activate your My Chart account. Instructions are located on the last page of this paperwork. If you have not heard from [place] regarding the results in 2 weeks, please contact this office.

## 2017-11-25 NOTE — Progress Notes (Signed)
11/28/2017 8:51 AM   DOB: 02-Apr-1966 / MRN: [id number]  SUBJECTIVE:  [name] [name] is a 52 y.o. male presenting for follow-up of hypertension, diabetes, chronic pericarditis.  Patient was seen by cardiology for history of chronic pericarditis.  Cardiology is recommending lifelong treatment with colchicine.  His echo was normal.  Stress test was normal aside from an elevated blood pressure associated with exercise.  He was started on lisinopril 20 mg by cardiology.  He has a history of well-controlled diabetes.  This is been managed with 500 mg metformin daily in the morning only.  He denies stocking glove paresthesia, vision changes.  Complains of agitation which has been worsening over the last few months.  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  States that since this time has felt increasingly aggravated and "short fused" with th continues to have GERD-like symptoms.  E employees that he has to manage.  Feels this is bad enough at this time to try some medication.  Is taking proton pump inhibitor mostly as needed.  States if he misses a day or 2 of doses and his GERD will return, and abate with about 2-3 days of pantoprazole.    He has No Known Allergies.   He  has a past medical history of Allergy, Arthritis, Asthma, Heart murmur, Hypertension, Kidney stones, Lyme disease, Pericarditis, and Pneumonia.    He  reports that  has never smoked. he has never used smokeless tobacco. He reports that he does not drink alcohol or use drugs. He  reports that he currently engages in sexual activity. The patient  has a past surgical history that includes Hernia repair.  His family history includes Asthma in his mother; Cancer in his father and mother; Diabetes in his father, maternal grandmother, and mother; Heart disease in his maternal grandfather and mother; Kidney disease in his father; Lung cancer in his mother; Stroke in his  daughter.  Review of Systems  Constitutional: Negative for chills, diaphoresis and fever.  Eyes: Negative.   Respiratory: Negative for cough, hemoptysis, sputum production, shortness of breath and wheezing.   Cardiovascular: Negative for chest pain, orthopnea and leg swelling.  Gastrointestinal: Negative for abdominal pain, blood in stool, constipation, diarrhea, heartburn, melena, nausea and vomiting.  Genitourinary: Negative for flank pain.  Skin: Negative for rash.  Neurological: Negative for dizziness, sensory change, speech change, focal weakness and headaches.    The problem list and medications were reviewed and updated by myself where necessary and exist elsewhere in the encounter.   OBJECTIVE:  BP (!) 156/87 (BP Location: Right Arm, Patient Position: Sitting, Cuff Size: Large)   Pulse 69   Temp (!) 97.5 F (36.4 C) (Oral)   Resp 16   Ht 5\' 8"  (1.727 m)   Wt 176 lb (79.8 kg)   SpO2 97%   BMI 26.76 kg/m   BP Readings from Last 3 Encounters:  11/25/17 (!) 156/87  09/17/17 128/78  09/15/17 136/79   Wt Readings from Last 3 Encounters:  11/25/17 176 lb (79.8 kg)  09/17/17 174 lb 9.6 oz (79.2 kg)  09/15/17 172 lb 3.2 oz (78.1 kg)   Lab Results  Component Value Date   WBC 6.1 11/25/2017   HGB 14.7 11/25/2017   HCT 43.5 11/25/2017   MCV 83 11/25/2017   PLT 313 11/25/2017    Lab Results  Component Value Date   CREATININE 0.89 11/25/2017   BUN 10  11/25/2017   NA 140 11/25/2017   K 4.5 11/25/2017   CL 103 11/25/2017   CO2 22 11/25/2017    Lab Results  Component Value Date   ALT 40 11/25/2017   AST 17 11/25/2017   ALKPHOS 117 11/25/2017   BILITOT 0.5 11/25/2017    Lab Results  Component Value Date   TSH 0.917 11/25/2017    Lab Results  Component Value Date   HGBA1C 6.5 (H) 11/25/2017    Lab Results  Component Value Date   CHOL 126 11/25/2017   HDL 45 11/25/2017   LDLCALC 64 11/25/2017   TRIG 83 11/25/2017   CHOLHDL 2.8 11/25/2017       Physical Exam  Constitutional: He is oriented to person, place, and time. He appears well-developed. He is active and cooperative.  Non-toxic appearance.  Eyes: EOM are normal. Pupils are equal, round, and reactive to light.  Cardiovascular: Normal rate, regular rhythm, S1 normal, S2 normal, normal heart sounds, intact distal pulses and normal pulses. Exam reveals no gallop and no friction rub.  No murmur heard. Pulmonary/Chest: Effort normal. No stridor. No tachypnea. No respiratory distress. He has no wheezes. He has no rales.  Abdominal: He exhibits no distension.  Musculoskeletal: He exhibits no edema.  Neurological: He is alert and oriented to person, place, and time. He has normal strength and normal reflexes. He is not disoriented. No cranial nerve deficit or sensory deficit. He exhibits normal muscle tone. Coordination and gait normal.  Skin: Skin is warm and dry. He is not diaphoretic. No pallor.  Psychiatric: His behavior is normal.  Vitals reviewed.   Results for orders placed or performed in visit on 11/25/17 (from the past 72 hour(s))  CBC     Status: None   Collection Time: 11/25/17 12:17 PM  Result Value Ref Range   WBC 6.1 3.4 - 10.8 x10E3/uL   RBC 5.23 4.14 - 5.80 x10E6/uL   Hemoglobin 14.7 13.0 - 17.7 g/dL   Hematocrit 43.5 37.5 - 51.0 %   MCV 83 79 - 97 fL   MCH 28.1 26.6 - 33.0 pg   MCHC 33.8 31.5 - 35.7 g/dL   RDW 14.1 12.3 - 15.4 %   Platelets 313 150 - 379 x10E3/uL  Lipid panel     Status: None   Collection Time: 11/25/17 12:17 PM  Result Value Ref Range   Cholesterol, Total 126 100 - 199 mg/dL   Triglycerides 83 0 - 149 mg/dL   HDL 45 >39 mg/dL   VLDL Cholesterol Cal 17 5 - 40 mg/dL   LDL Calculated 64 0 - 99 mg/dL   Chol/HDL Ratio 2.8 0.0 - 5.0 ratio    Comment:                                   T. Chol/HDL Ratio                                             Men  Women                               1/2 Avg.Risk  3.4    3.3  Avg.Risk  5.0    4.4                                2X Avg.Risk  9.6    7.1                                3X Avg.Risk 23.4   11.0   TSH     Status: None   Collection Time: 11/25/17 12:17 PM  Result Value Ref Range   TSH 0.917 0.450 - 4.500 uIU/mL  Hemoglobin A1c     Status: Abnormal   Collection Time: 11/25/17 12:17 PM  Result Value Ref Range   Hgb A1c MFr Bld 6.5 (H) 4.8 - 5.6 %    Comment:          Prediabetes: 5.7 - 6.4          Diabetes: >6.4          Glycemic control for adults with diabetes: <7.0    Est. average glucose Bld gHb Est-mCnc 140 mg/dL  Basic metabolic panel     Status: Abnormal   Collection Time: 11/25/17 12:17 PM  Result Value Ref Range   Glucose 215 (H) 65 - 99 mg/dL   BUN 10 6 - 24 mg/dL   Creatinine, Ser 0.89 0.76 - 1.27 mg/dL   GFR calc non Af Amer 98 >59 mL/min/1.73   GFR calc Af Amer 114 >59 mL/min/1.73   BUN/Creatinine Ratio 11 9 - 20   Sodium 140 134 - 144 mmol/L   Potassium 4.5 3.5 - 5.2 mmol/L   Chloride 103 96 - 106 mmol/L   CO2 22 20 - 29 mmol/L   Calcium 9.5 8.7 - 10.2 mg/dL  Hepatic function panel     Status: None   Collection Time: 11/25/17 12:17 PM  Result Value Ref Range   Total Protein 7.0 6.0 - 8.5 g/dL   Albumin 4.5 3.5 - 5.5 g/dL   Bilirubin Total 0.5 0.0 - 1.2 mg/dL   Bilirubin, Direct 0.14 0.00 - 0.40 mg/dL   Alkaline Phosphatase 117 39 - 117 IU/L   AST 17 0 - 40 IU/L   ALT 40 0 - 44 IU/L    No results found.  ASSESSMENT AND PLAN:  Yi was seen today for hypertension.  Diagnoses and all orders for this visit:  Hypertension, unspecified type -     lisinopril (PRINIVIL,ZESTRIL) 20 MG tablet; Take 1 tablet (20 mg total) by mouth daily. -     CBC -     TSH -     Basic metabolic panel -     Hepatic function panel  Well controlled type 2 diabetes mellitus ([place]) -     atorvastatin (LIPITOR) 40 MG tablet; Take 1 tablet (40 mg total) by mouth at bedtime. -     metFORMIN (GLUCOPHAGE) 500 MG tablet; Take 1  tablet (500 mg total) by mouth daily with breakfast. -     Lipid panel -     Hemoglobin A1c  Chronic pericarditis, unspecified complication status, unspecified type -     colchicine 0.6 MG tablet; Take 1 tablet (0.6 mg total) by mouth at bedtime.  Nervous irritability -     escitalopram (LEXAPRO) 5 MG tablet; Take 0.5 tablets (2.5 mg total) by mouth at bedtime.  Gastroesophageal reflux disease, esophagitis presence not specified -  pantoprazole (PROTONIX) 40 MG tablet; Take 1 tablet (40 mg total) by mouth daily as needed.    The patient is advised to call or return to clinic if he does not see an improvement in symptoms, or to seek the care of the closest emergency department if he worsens with the above plan.   Philis Fendt, MHS, PA-C [facility] 11/28/2017 8:51 AM

## 2017-11-26 LAB — HEPATIC FUNCTION PANEL
ALT: 40 IU/L (ref 0–44)
AST: 17 IU/L (ref 0–40)
Albumin: 4.5 g/dL (ref 3.5–5.5)
Alkaline Phosphatase: 117 IU/L (ref 39–117)
BILIRUBIN, DIRECT: 0.14 mg/dL (ref 0.00–0.40)
Bilirubin Total: 0.5 mg/dL (ref 0.0–1.2)
TOTAL PROTEIN: 7 g/dL (ref 6.0–8.5)

## 2017-11-26 LAB — CBC
HEMOGLOBIN: 14.7 g/dL (ref 13.0–17.7)
Hematocrit: 43.5 % (ref 37.5–51.0)
MCH: 28.1 pg (ref 26.6–33.0)
MCHC: 33.8 g/dL (ref 31.5–35.7)
MCV: 83 fL (ref 79–97)
Platelets: 313 10*3/uL (ref 150–379)
RBC: 5.23 x10E6/uL (ref 4.14–5.80)
RDW: 14.1 % (ref 12.3–15.4)
WBC: 6.1 10*3/uL (ref 3.4–10.8)

## 2017-11-26 LAB — HEMOGLOBIN A1C
Est. average glucose Bld gHb Est-mCnc: 140 mg/dL
HEMOGLOBIN A1C: 6.5 % — AB (ref 4.8–5.6)

## 2017-11-26 LAB — LIPID PANEL
Chol/HDL Ratio: 2.8 ratio (ref 0.0–5.0)
Cholesterol, Total: 126 mg/dL (ref 100–199)
HDL: 45 mg/dL (ref >39)
LDL CALC: 64 mg/dL (ref 0–99)
Triglycerides: 83 mg/dL (ref 0–149)
VLDL CHOLESTEROL CAL: 17 mg/dL (ref 5–40)

## 2017-11-26 LAB — BASIC METABOLIC PANEL
BUN/Creatinine Ratio: 11 (ref 9–20)
BUN: 10 mg/dL (ref 6–24)
CALCIUM: 9.5 mg/dL (ref 8.7–10.2)
CHLORIDE: 103 mmol/L (ref 96–106)
CO2: 22 mmol/L (ref 20–29)
CREATININE: 0.89 mg/dL (ref 0.76–1.27)
GFR, EST AFRICAN AMERICAN: 114 mL/min/{1.73_m2} (ref >59)
GFR, EST NON AFRICAN AMERICAN: 98 mL/min/{1.73_m2} (ref >59)
Glucose: 215 mg/dL — ABNORMAL HIGH (ref 65–99)
Potassium: 4.5 mmol/L (ref 3.5–5.2)
SODIUM: 140 mmol/L (ref 134–144)

## 2017-11-26 LAB — TSH: TSH: 0.917 u[IU]/mL (ref 0.450–4.500)

## 2017-11-28 DIAGNOSIS — I10 Essential (primary) hypertension: Secondary | ICD-10-CM

## 2017-11-28 DIAGNOSIS — I319 Disease of pericardium, unspecified: Secondary | ICD-10-CM

## 2017-11-28 DIAGNOSIS — R450 Nervousness: Secondary | ICD-10-CM | POA: Insufficient documentation

## 2017-11-28 HISTORY — DX: Disease of pericardium, unspecified: I31.9

## 2017-11-28 HISTORY — DX: Essential (primary) hypertension: I10

## 2018-01-18 ENCOUNTER — Encounter: Payer: Self-pay | Admitting: Physician Assistant

## 2018-05-04 ENCOUNTER — Encounter: Payer: Self-pay | Admitting: Physician Assistant

## 2018-05-04 ENCOUNTER — Ambulatory Visit (INDEPENDENT_AMBULATORY_CARE_PROVIDER_SITE_OTHER): Payer: 59 | Admitting: Physician Assistant

## 2018-05-04 VITALS — BP 138/77 | HR 58 | Temp 97.4°F | Resp 17 | Ht 68.0 in | Wt 177.0 lb

## 2018-05-04 DIAGNOSIS — H1013 Acute atopic conjunctivitis, bilateral: Secondary | ICD-10-CM | POA: Diagnosis not present

## 2018-05-04 MED ORDER — NAPHAZOLINE-PHENIRAMINE 0.025-0.3 % OP SOLN: 1.0000 [drp] | Freq: Four times a day (QID) | OPHTHALMIC | 0 refills | Status: DC | PRN

## 2018-05-04 NOTE — Patient Instructions (Addendum)
Start using Naphcon-A 1 drop into both eyes 4x/day as needed for eye irritation.    Allergic Conjunctivitis, Adult Allergic conjunctivitis is inflammation of the clear membrane that covers the white part of your eye and the inner surface of your eyelid (conjunctiva). The inflammation is caused by allergies. The blood vessels in the conjunctiva become inflamed and this causes the eyes to become red or pink. The eyes often feel itchy. Allergic conjunctivitis cannot be spread from one person to another person (is not contagious). What are the causes? This condition is caused by an allergic reaction. Common causes of an allergic reaction (allergens) include:  Outdoor allergens, such as: ? Pollen. ? Grass and weeds. ? Mold spores.  Indoor allergens, such as: ? Dust. ? Smoke. ? Mold. ? Pet dander. ? Animal hair.  What increases the risk? You may be more likely to develop this condition if you have a family history of allergies, such as:  Allergic rhinitis.  Bronchial asthma.  Atopic dermatitis.  What are the signs or symptoms? Symptoms of this condition include eyes that are:  Itchy.  Red.  Watery.  Puffy.  Your eyes may also:  Sting or burn.  Have clear drainage coming from them.  How is this diagnosed? This condition may be diagnosed by medical history and physical exam. If you have drainage from your eyes, it may be tested to rule out other causes of conjunctivitis. You may also need to see a health care provider who specializes in treating allergies (allergist) or eye conditions (ophthalmologist) for tests to confirm the diagnosis. You may have:  Skin tests to see which allergens are causing your symptoms. These tests involve pricking the skin with a tiny needle and exposing the skin to small amounts of potential allergens to see if your skin reacts.  Blood tests.  Tissue scrapings from your eyelid. These will be examined under a microscope.  How is this  treated? Treatments for this condition may include:  Cold cloths (compresses) to soothe itching and swelling.  Washing the face to remove allergens.  Eye drops. These may be prescription or over-the-counter. There are several different types. You may need to try different types to see which one works best for you. Your may need: ? Eye drops that block the allergic reaction (antihistamine). ? Eye drops that reduce swelling and irritation (anti-inflammatory). ? Steroid eye drops to lessen a severe reaction (vernal conjunctivitis).  Oral antihistamine medicines to reduce your allergic reaction. You may need these if eye drops do not help or are difficult to use.  Follow these instructions at home:  Avoid known allergens whenever possible.  Take or apply over-the-counter and prescription medicines only as told by your health care provider. These include any eye drops.  Apply a cool, clean washcloth to your eye for 10-20 minutes, 3-4 times a day.  Do not touch or rub your eyes.  Do not wear contact lenses until the inflammation is gone. Wear glasses instead.  Do not wear eye makeup until the inflammation is gone.  Keep all follow-up visits as told by your health care provider. This is important. Contact a health care provider if:  Your symptoms get worse or do not improve with treatment.  You have mild eye pain.  You have sensitivity to light.  You have spots or blisters on your eyes.  You have pus draining from your eye.  You have a fever. Get help right away if:  You have redness, swelling, or other symptoms  in only one eye.  Your vision is blurred or you have vision changes.  You have severe eye pain. This information is not intended to replace advice given to you by your health care provider. Make sure you discuss any questions you have with your health care provider. Document Released: 12/20/2002 Document Revised: 05/28/2016 Document Reviewed: 04/11/2016 Elsevier  Interactive Patient Education  2018 [employer].  IF you received an x-ray today, you will receive an invoice from [facility]. Please contact [facility] at [phone] with questions or concerns regarding your invoice.   IF you received labwork today, you will receive an invoice from [place]. Please contact [facility] at [phone] with questions or concerns regarding your invoice.   Our billing staff will not be able to assist you with questions regarding bills from these companies.  You will be contacted with the lab results as soon as they are available. The fastest way to get your results is to activate your My Chart account. Instructions are located on the last page of this paperwork. If you have not heard from [place] regarding the results in 2 weeks, please contact this office.

## 2018-05-04 NOTE — Progress Notes (Signed)
[name] [name]  MRN: [id number] DOB: 30-Apr-1966  PCP: Tereasa Coop, PA-C  Subjective:  Pt is a 52 year old male who presents to clinic for eye problem.  Both eyes are itching, R>L. Crusting this morning of both eyes - denies yellow or green discharge. Denies FB sensation, photophobia, pain, redness.  He has been using Visine allergy.  He takes Claritin daily for seasonal allergies.   Review of Systems  Eyes: Positive for discharge and itching. Negative for photophobia, pain, redness and visual disturbance.  Allergic/Immunologic: Positive for environmental allergies.    Patient Active Problem List   Diagnosis Date Noted  . Nervous irritability 11/28/2017  . Chronic pericarditis 11/28/2017  . Hypertension 11/28/2017  . Well controlled type 2 diabetes mellitus ([place]) 03/19/2017    Current Outpatient Medications on File Prior to Visit  Medication Sig Dispense Refill  . aspirin 81 MG tablet Take 81 mg by mouth daily.    Marland Kitchen atorvastatin (LIPITOR) 40 MG tablet Take 1 tablet (40 mg total) by mouth at bedtime. 90 tablet 3  . calcium carbonate (TUMS - DOSED IN MG ELEMENTAL CALCIUM) 500 MG chewable tablet Chew 1-2 tablets by mouth 2 (two) times daily as needed for indigestion or heartburn.    . Coenzyme Q10 (CO Q 10 PO) Take 1 capsule by mouth at bedtime.     . colchicine 0.6 MG tablet Take 1 tablet (0.6 mg total) by mouth at bedtime. 90 tablet 3  . escitalopram (LEXAPRO) 5 MG tablet Take 0.5 tablets (2.5 mg total) by mouth at bedtime. 45 tablet 3  . Fish Oil-Cholecalciferol (FISH OIL + D3 PO) Take 1 capsule by mouth daily.     Marland Kitchen lisinopril (PRINIVIL,ZESTRIL) 20 MG tablet Take 1 tablet (20 mg total) by mouth daily. 90 tablet 3  . loratadine (CLARITIN) 10 MG tablet Take 10 mg by mouth daily.    . metFORMIN (GLUCOPHAGE) 500 MG tablet Take 1 tablet (500 mg total) by mouth daily with breakfast. 90 tablet 3  . Multiple Vitamin (MULTIVITAMIN) tablet Take 1 tablet by mouth daily. Reported on  12/20/2015    . pantoprazole (PROTONIX) 40 MG tablet Take 1 tablet (40 mg total) by mouth daily as needed. 90 tablet 3   No current facility-administered medications on file prior to visit.     No Known Allergies   Objective:  BP 138/77   Pulse (!) 58   Temp (!) 97.4 F (36.3 C) (Oral)   Resp 17   Ht 5\' 8"  (1.727 m)   Wt 177 lb (80.3 kg)   SpO2 98%   BMI 26.91 kg/m   Physical Exam  Constitutional: He is oriented to person, place, and time. He appears well-developed and well-nourished.  Eyes: Conjunctivae are normal.  Conjunctiva not injected. No drainage, edema, crusting of lids. No FB noted.  Negative fluorescein eye exam - no uptake appreciated.   Neurological: He is alert and oriented to person, place, and time.  Skin: Skin is warm and dry.  Psychiatric: He has a normal mood and affect. His behavior is normal. Judgment and thought content normal.  Vitals reviewed.   Assessment and Plan :  1. Allergic conjunctivitis of both eyes - Pt c/o b/l eye itching x 4 days. No red flags on HPI or PE. Negative fluorescein eye exam. Plan to treat with naphcon-A. RTC if no improvement, consider ophthalmology referral.   - naphazoline-pheniramine (NAPHCON-A) 0.025-0.3 % ophthalmic solution; Place 1 drop into both eyes 4 (four) times daily as  needed for eye irritation.  Dispense: 15 mL; Refill: 0   Whitney Modest Draeger, PA-C  [facility] 05/04/2018 1:51 PM

## 2018-11-18 ENCOUNTER — Other Ambulatory Visit: Payer: Self-pay | Admitting: Physician Assistant

## 2018-11-18 DIAGNOSIS — R450 Nervousness: Secondary | ICD-10-CM

## 2018-11-18 DIAGNOSIS — E119 Type 2 diabetes mellitus without complications: Secondary | ICD-10-CM

## 2018-11-29 ENCOUNTER — Other Ambulatory Visit: Payer: Self-pay

## 2018-11-29 ENCOUNTER — Ambulatory Visit (INDEPENDENT_AMBULATORY_CARE_PROVIDER_SITE_OTHER): Payer: 59

## 2018-11-29 ENCOUNTER — Encounter: Payer: Self-pay | Admitting: Family Medicine

## 2018-11-29 ENCOUNTER — Ambulatory Visit: Payer: 59 | Admitting: Family Medicine

## 2018-11-29 VITALS — BP 138/77 | HR 69 | Temp 98.3°F | Resp 17 | Ht 68.0 in | Wt 176.8 lb

## 2018-11-29 DIAGNOSIS — M79605 Pain in left leg: Secondary | ICD-10-CM

## 2018-11-29 NOTE — Patient Instructions (Addendum)
  Continue ibuprofen Keep leg elevated    If you have lab work done today you will be contacted with your lab results within the next 2 weeks.  If you have not heard from [place] then please contact us. The fastest way to get your results is to register for My Chart.   IF you received an x-ray today, you will receive an invoice from [facility]. Please contact [facility] at [phone] with questions or concerns regarding your invoice.   IF you received labwork today, you will receive an invoice from [place] [facility]. Please contact [facility] at [phone] with questions or concerns regarding your invoice.   Our billing staff will not be able to assist you with questions regarding bills from these companies.  You will be contacted with the lab results as soon as they are available. The fastest way to get your results is to activate your My Chart account. Instructions are located on the last page of this paperwork. If you have not heard from [place] regarding the results in 2 weeks, please contact this office.

## 2018-11-29 NOTE — Progress Notes (Signed)
Established Patient Office Visit  Subjective:  Patient ID: [name] [name], male    DOB: 1965-12-02  Age: 53 y.o. MRN: [id number]  CC:  Chief Complaint  Patient presents with  . LEFT SHIN PAIN    ONSET: THURSDAY 11/25/2018, PER PT PROPANE TANK FELL ON LEFT SHIN AND THEN A LIMB FELL ON LEFT SHIN AS WELL.  PAIN IS 5/10 CONSTANT AND THROBBING LIKE A HEARTBEAT, ACHY.  IBUPROFEN HELPS SOME AND ASPERCREME RUBBED ON SIDE OF SHIN THAT ARE NOT BRUISED AND SCABBED OVER.    HPI Josafat Enrico presents for   He reports 2 episodes of trauma to the left shin 11/25/2018 States that he started having pain that has been progressive He reports that he has 2 areas of tenderness He states that there wi [place] with standing All position He states that it is throbbing pain He took ibuprofen 600mg  which he is taking BID He tried aspercreme topically   Past Medical History:  Diagnosis Date  . Allergy   . Arthritis   . Asthma   . Heart murmur   . Hypertension   . Kidney stones   . Lyme disease   . Pericarditis   . Pneumonia     Past Surgical History:  Procedure Laterality Date  . HERNIA REPAIR      Family History  Problem Relation Age of Onset  . Diabetes Mother   . Cancer Mother        CHF  . Heart disease Mother        CHF  . Asthma Mother   . Lung cancer Mother        smoked  . Cancer Father        prostate  . Diabetes Father   . Kidney disease Father        CANCER  . Stroke Daughter   . Diabetes Maternal Grandmother   . Heart disease Maternal Grandfather        HEART ATTACK  . Colon cancer Neg Hx     Social History   Socioeconomic History  . Marital status: Married    Spouse name: Not on file  . Number of children: Not on file  . Years of education: Not on file  . Highest education level: Not on file  Occupational History  . Occupation: Glass blower/designer  Social Needs  . Financial resource strain: Not on file  . Food insecurity:    Worry: Not on file    Inability:  Not on file  . Transportation needs:    Medical: Not on file    Non-medical: Not on file  Tobacco Use  . Smoking status: Never Smoker  . Smokeless tobacco: Never Used  Substance and Sexual Activity  . Alcohol use: No    Alcohol/week: 0.0 standard drinks  . Drug use: No  . Sexual activity: Yes  Lifestyle  . Physical activity:    Days per week: Not on file    Minutes per session: Not on file  . Stress: Not on file  Relationships  . Social connections:    Talks on phone: Not on file    Gets together: Not on file    Attends religious service: Not on file    Active member of club or organization: Not on file    Attends meetings of clubs or organizations: Not on file    Relationship status: Not on file  . Intimate partner violence:    Fear of current or ex partner: Not on  file    Emotionally abused: Not on file    Physically abused: Not on file    Forced sexual activity: Not on file  Other Topics Concern  . Not on file  Social History Narrative  . Not on file    Outpatient Medications Prior to Visit  Medication Sig Dispense Refill  . aspirin 81 MG tablet Take 81 mg by mouth daily.    . calcium carbonate (TUMS - DOSED IN MG ELEMENTAL CALCIUM) 500 MG chewable tablet Chew 1-2 tablets by mouth 2 (two) times daily as needed for indigestion or heartburn.    . Coenzyme Q10 (CO Q 10 PO) Take 1 capsule by mouth at bedtime.     . colchicine 0.6 MG tablet Take 1 tablet (0.6 mg total) by mouth at bedtime. 90 tablet 3  . escitalopram (LEXAPRO) 5 MG tablet Take 0.5 tablets (2.5 mg total) by mouth at bedtime. 45 tablet 3  . Fish Oil-Cholecalciferol (FISH OIL + D3 PO) Take 1 capsule by mouth daily.     Marland Kitchen lisinopril (PRINIVIL,ZESTRIL) 20 MG tablet Take 1 tablet (20 mg total) by mouth daily. 90 tablet 3  . loratadine (CLARITIN) 10 MG tablet Take 10 mg by mouth daily.    . Multiple Vitamin (MULTIVITAMIN) tablet Take 1 tablet by mouth daily. Reported on 12/20/2015    . pantoprazole (PROTONIX) 40  MG tablet Take 1 tablet (40 mg total) by mouth daily as needed. 90 tablet 3  . atorvastatin (LIPITOR) 40 MG tablet TAKE 1 TABLET BY MOUTH EVERYDAY AT BEDTIME 30 tablet 0  . metFORMIN (GLUCOPHAGE) 500 MG tablet TAKE 1 TABLET BY MOUTH EVERY DAY WITH BREAKFAST 30 tablet 0  . naphazoline-pheniramine (NAPHCON-A) 0.025-0.3 % ophthalmic solution Place 1 drop into both eyes 4 (four) times daily as needed for eye irritation. (Patient not taking: Reported on 11/29/2018) 15 mL 0   No facility-administered medications prior to visit.     No Known Allergies  ROS Review of Systems    Objective:    Physical Exam  BP 138/77 (BP Location: Right Arm, Patient Position: Sitting, Cuff Size: Large)   Pulse 69   Temp 98.3 F (36.8 C) (Oral)   Resp 17   Ht 5\' 8"  (1.727 m)   Wt 176 lb 12.8 oz (80.2 kg)   SpO2 96%   BMI 26.88 kg/m  Wt Readings from Last 3 Encounters:  11/29/18 176 lb 12.8 oz (80.2 kg)  05/04/18 177 lb (80.3 kg)  11/25/17 176 lb (79.8 kg)   Physical Exam  Constitutional: Oriented to person, place, and time. Appears well-developed and well-nourished.  HENT:  Head: Normocephalic and atraumatic.  Eyes: Conjunctivae and EOM are normal.  Cardiovascular: Normal rate, regular rhythm, normal heart sounds and intact distal pulses.  No murmur heard. Pulmonary/Chest: Effort normal and breath sounds normal. No stridor. No respiratory distress. Has no wheezes.  Neurological: Is alert and oriented to person, place, and time.  Skin: Skin is warm. Capillary refill takes less than 2 seconds.  Psychiatric: Has a normal mood and affect. Behavior is normal. Judgment and thought content normal.   Extremities  Tender over left shin   COMPARISON:  None.  FINDINGS: The knee and ankle joints are maintained. No acute fracture of the tibia or fibula is identified.  IMPRESSION: No acute bony findings.   Electronically Signed   By: Marijo Sanes M.D.   On: 11/29/2018 17:48  Health  Maintenance Due  Topic Date Due  . OPHTHALMOLOGY EXAM  03/19/2018  .  FOOT EXAM  04/30/2018  . INFLUENZA VACCINE  05/13/2018  . HEMOGLOBIN A1C  05/25/2018    There are no preventive care reminders to display for this patient.  Lab Results  Component Value Date   TSH 0.917 11/25/2017   Lab Results  Component Value Date   WBC 6.1 11/25/2017   HGB 14.7 11/25/2017   HCT 43.5 11/25/2017   MCV 83 11/25/2017   PLT 313 11/25/2017   Lab Results  Component Value Date   NA 140 11/25/2017   K 4.5 11/25/2017   CO2 22 11/25/2017   GLUCOSE 215 (H) 11/25/2017   BUN 10 11/25/2017   CREATININE 0.89 11/25/2017   BILITOT 0.5 11/25/2017   ALKPHOS 117 11/25/2017   AST 17 11/25/2017   ALT 40 11/25/2017   PROT 7.0 11/25/2017   ALBUMIN 4.5 11/25/2017   CALCIUM 9.5 11/25/2017   ANIONGAP 7 09/15/2017   GFR 76.92 03/29/2015   Lab Results  Component Value Date   CHOL 126 11/25/2017   Lab Results  Component Value Date   HDL 45 11/25/2017   Lab Results  Component Value Date   LDLCALC 64 11/25/2017   Lab Results  Component Value Date   TRIG 83 11/25/2017   Lab Results  Component Value Date   CHOLHDL 2.8 11/25/2017   Lab Results  Component Value Date   HGBA1C 6.5 (H) 11/25/2017      Assessment & Plan:   Problem List Items Addressed This Visit    None    Visit Diagnoses    Left leg pain    -  Primary   Relevant Orders   DG Tibia/Fibula Left (Completed)     No fracture noted Advised antiinflammatory  No orders of the defined types were placed in this encounter.   Follow-up: Return in about 4 weeks (around 12/27/2018) for labs and diabetes check (use same day appointment).    Forrest Moron, MD

## 2018-12-15 ENCOUNTER — Other Ambulatory Visit: Payer: Self-pay | Admitting: Physician Assistant

## 2018-12-15 DIAGNOSIS — E119 Type 2 diabetes mellitus without complications: Secondary | ICD-10-CM

## 2018-12-24 ENCOUNTER — Other Ambulatory Visit: Payer: Self-pay | Admitting: Physician Assistant

## 2018-12-24 DIAGNOSIS — I10 Essential (primary) hypertension: Secondary | ICD-10-CM

## 2018-12-24 NOTE — Telephone Encounter (Signed)
90 day courtesy refill given. Appt scheduled on 01/28/19

## 2018-12-26 ENCOUNTER — Other Ambulatory Visit: Payer: Self-pay | Admitting: Physician Assistant

## 2018-12-26 DIAGNOSIS — I319 Disease of pericardium, unspecified: Secondary | ICD-10-CM

## 2019-01-08 ENCOUNTER — Other Ambulatory Visit: Payer: Self-pay | Admitting: Physician Assistant

## 2019-01-08 DIAGNOSIS — E119 Type 2 diabetes mellitus without complications: Secondary | ICD-10-CM

## 2019-01-19 ENCOUNTER — Other Ambulatory Visit: Payer: Self-pay | Admitting: Physician Assistant

## 2019-01-19 ENCOUNTER — Other Ambulatory Visit: Payer: Self-pay

## 2019-01-19 DIAGNOSIS — I10 Essential (primary) hypertension: Secondary | ICD-10-CM

## 2019-01-19 DIAGNOSIS — I319 Disease of pericardium, unspecified: Secondary | ICD-10-CM

## 2019-01-19 DIAGNOSIS — E119 Type 2 diabetes mellitus without complications: Secondary | ICD-10-CM

## 2019-01-24 ENCOUNTER — Ambulatory Visit: Payer: 59

## 2019-01-28 ENCOUNTER — Ambulatory Visit (INDEPENDENT_AMBULATORY_CARE_PROVIDER_SITE_OTHER): Payer: 59 | Admitting: Family Medicine

## 2019-01-28 ENCOUNTER — Other Ambulatory Visit: Payer: Self-pay

## 2019-01-28 ENCOUNTER — Encounter: Payer: Self-pay | Admitting: Family Medicine

## 2019-01-28 VITALS — BP 138/79 | HR 54 | Resp 16 | Ht 68.0 in | Wt 176.0 lb

## 2019-01-28 DIAGNOSIS — R450 Nervousness: Secondary | ICD-10-CM | POA: Diagnosis not present

## 2019-01-28 DIAGNOSIS — E1165 Type 2 diabetes mellitus with hyperglycemia: Secondary | ICD-10-CM

## 2019-01-28 DIAGNOSIS — I319 Disease of pericardium, unspecified: Secondary | ICD-10-CM

## 2019-01-28 DIAGNOSIS — K219 Gastro-esophageal reflux disease without esophagitis: Secondary | ICD-10-CM

## 2019-01-28 DIAGNOSIS — I10 Essential (primary) hypertension: Secondary | ICD-10-CM

## 2019-01-28 LAB — POCT GLYCOSYLATED HEMOGLOBIN (HGB A1C): Hemoglobin A1C: 7.2 % — AB (ref 4.0–5.6)

## 2019-01-28 LAB — POCT URINALYSIS DIPSTICK
Bilirubin, UA: NEGATIVE
Blood, UA: NEGATIVE
Glucose, UA: NEGATIVE
Ketones, UA: NEGATIVE
Leukocytes, UA: NEGATIVE
Nitrite, UA: NEGATIVE
Protein, UA: NEGATIVE
Spec Grav, UA: >=1.03 — AB (ref 1.010–1.025)
Urobilinogen, UA: 0.2 E.U./dL
pH, UA: 5.5 (ref 5.0–8.0)

## 2019-01-28 MED ORDER — ESCITALOPRAM OXALATE 5 MG PO TABS: 5.0000 mg | ORAL_TABLET | Freq: Every day | ORAL | 3 refills | Status: DC

## 2019-01-28 NOTE — Patient Instructions (Addendum)
Body mass index is 26.76 kg/m. Lab Results  Component Value Date   HGBA1C 7.2 (A) 01/28/2019   BP Readings from Last 3 Encounters:  01/28/19 138/79  11/29/18 138/77  05/04/18 138/77    Wt Readings from Last 3 Encounters:  01/28/19 176 lb (79.8 kg)  11/29/18 176 lb 12.8 oz (80.2 kg)  05/04/18 177 lb (80.3 kg)    Diabetes Mellitus and Exercise Exercising regularly is important for your overall health, especially when you have diabetes (diabetes mellitus). Exercising is not only about losing weight. It has many other health benefits, such as increasing muscle strength and bone density and reducing body fat and stress. This leads to improved fitness, flexibility, and endurance, all of which result in better overall health. Exercise has additional benefits for people with diabetes, including:  Reducing appetite.  Helping to lower and control blood glucose.  Lowering blood pressure.  Helping to control amounts of fatty substances (lipids) in the blood, such as cholesterol and triglycerides.  Helping the body to respond better to insulin (improving insulin sensitivity).  Reducing how much insulin the body needs.  Decreasing the risk for heart disease by: ? Lowering cholesterol and triglyceride levels. ? Increasing the levels of good cholesterol. ? Lowering blood glucose levels. What is my activity plan? Your health care provider or certified diabetes educator can help you make a plan for the type and frequency of exercise (activity plan) that works for you. Make sure that you:  Do at least 150 minutes of moderate-intensity or vigorous-intensity exercise each week. This could be brisk walking, biking, or water aerobics. ? Do stretching and strength exercises, such as yoga or weightlifting, at least 2 times a week. ? Spread out your activity over at least 3 days of the week.  Get some form of physical activity every day. ? Do not go more than 2 days in a row without some kind of  physical activity. ? Avoid being inactive for more than 30 minutes at a time. Take frequent breaks to walk or stretch.  Choose a type of exercise or activity that you enjoy, and set realistic goals.  Start slowly, and gradually increase the intensity of your exercise over time. What do I need to know about managing my diabetes?   Check your blood glucose before and after exercising. ? If your blood glucose is 240 mg/dL (13.3 mmol/L) or higher before you exercise, check your urine for ketones. If you have ketones in your urine, do not exercise until your blood glucose returns to normal. ? If your blood glucose is 100 mg/dL (5.6 mmol/L) or lower, eat a snack containing 15-20 grams of carbohydrate. Check your blood glucose 15 minutes after the snack to make sure that your level is above 100 mg/dL (5.6 mmol/L) before you start your exercise.  Know the symptoms of low blood glucose (hypoglycemia) and how to treat it. Your risk for hypoglycemia increases during and after exercise. Common symptoms of hypoglycemia can include: ? Hunger. ? Anxiety. ? Sweating and feeling clammy. ? Confusion. ? Dizziness or feeling light-headed. ? Increased heart rate or palpitations. ? Blurry vision. ? Tingling or numbness around the mouth, lips, or tongue. ? Tremors or shakes. ? Irritability.  Keep a rapid-acting carbohydrate snack available before, during, and after exercise to help prevent or treat hypoglycemia.  Avoid injecting insulin into areas of the body that are going to be exercised. For example, avoid injecting insulin into: ? The arms, when playing tennis. ? The legs, when  jogging.  Keep records of your exercise habits. Doing this can help you and your health care provider adjust your diabetes management plan as needed. Write down: ? Food that you eat before and after you exercise. ? Blood glucose levels before and after you exercise. ? The type and amount of exercise you have done. ? When your  insulin is expected to peak, if you use insulin. Avoid exercising at times when your insulin is peaking.  When you start a new exercise or activity, work with your health care provider to make sure the activity is safe for you, and to adjust your insulin, medicines, or food intake as needed.  Drink plenty of water while you exercise to prevent dehydration or heat stroke. Drink enough fluid to keep your urine clear or pale yellow. Summary  Exercising regularly is important for your overall health, especially when you have diabetes (diabetes mellitus).  Exercising has many health benefits, such as increasing muscle strength and bone density and reducing body fat and stress.  Your health care provider or certified diabetes educator can help you make a plan for the type and frequency of exercise (activity plan) that works for you.  When you start a new exercise or activity, work with your health care provider to make sure the activity is safe for you, and to adjust your insulin, medicines, or food intake as needed. This information is not intended to replace advice given to you by your health care provider. Make sure you discuss any questions you have with your health care provider. Document Released: 12/20/2003 Document Revised: 04/09/2017 Document Reviewed: 03/10/2016 Elsevier Interactive Patient Education  2019 [employer].

## 2019-01-28 NOTE — Progress Notes (Signed)
Established Patient Office Visit  Subjective:  Patient ID: [name] [name], male    DOB: [birth year]  Age: 53 y.o. MRN: [id number]  CC:  Chief Complaint  Patient presents with  . Diabetes  . Medication Management    HPI [name] [name] presents for   Pericarditis He states that he does not want to be on this medication for the rest of his life He states that his Cardiologist Dr. Einar Gip told him he would probably need it for the rest of his life because his pericarditis responds to it and he has not been able to wean off.   Diabetes follow up Diabetes Mellitus: Patient presents for follow up of diabetes. Symptoms: none. Symptoms have stabilized. Patient denies foot ulcerations, hyperglycemia, hypoglycemia , increase appetite and nausea.  Evaluation to date has been included: hemoglobin A1C.  Home sugars: patient does not check sugars. Treatment to date: Continued metformin which has been effective.  Lab Results  Component Value Date   HGBA1C 7.2 (A) 01/28/2019   Hypertension: Patient here for follow-up of elevated blood pressure. He is exercising and is adherent to low salt diet.  Blood pressure is well controlled at home. Cardiac symptoms none. Patient denies chest pain, dyspnea and lower extremity edema.  Cardiovascular risk factors: diabetes mellitus and male gender. Use of agents associated with hypertension: none. History of target organ damage: none. BP Readings from Last 3 Encounters:  01/28/19 138/79  11/29/18 138/77  05/04/18 138/77    GERD He has heart burn from acidic foods like pineapple juice He denies any chest pains, no vomiting His reflux meds helps with symptoms  Irritability Depression screen [facility] 2/9 01/28/2019 11/29/2018 11/25/2017 09/17/2017 09/15/2017  Decreased Interest 0 0 0 0 0  Down, Depressed, Hopeless 0 0 0 0 0  PHQ - 2 Score 0 0 0 0 0  Altered sleeping - - - 0 -  Tired, decreased energy - - - 0 -  Change in appetite - - - 0 -  Feeling bad or failure  about yourself  - - - 0 -  Trouble concentrating - - - 0 -  Moving slowly or fidgety/restless - - - 0 -  Suicidal thoughts - - - 0 -  PHQ-9 Score - - - 0 -  Difficult doing work/chores - - - Not difficult at all -   He has tried cutting the escitalopram in half which is difficult It was prescribed for irritability and make him irritable having to cut the pill It seems to take the edge off He would like a stronger dose    Past Medical History:  Diagnosis Date  . Allergy   . Arthritis   . Asthma   . Heart murmur   . Hypertension   . Kidney stones   . Lyme disease   . Pericarditis   . Pneumonia     Past Surgical History:  Procedure Laterality Date  . HERNIA REPAIR      Family History  Problem Relation Age of Onset  . Diabetes Mother   . Cancer Mother        CHF  . Heart disease Mother        CHF  . Asthma Mother   . Lung cancer Mother        smoked  . Cancer Father        prostate  . Diabetes Father   . Kidney disease Father        CANCER  . Stroke Daughter   .  Diabetes Maternal Grandmother   . Heart disease Maternal Grandfather        HEART ATTACK  . Colon cancer Neg Hx     Social History   Socioeconomic History  . Marital status: Married    Spouse name: Not on file  . Number of children: Not on file  . Years of education: Not on file  . Highest education level: Not on file  Occupational History  . Occupation: Glass blower/designer  Social Needs  . Financial resource strain: Not on file  . Food insecurity:    Worry: Not on file    Inability: Not on file  . Transportation needs:    Medical: Not on file    Non-medical: Not on file  Tobacco Use  . Smoking status: Never Smoker  . Smokeless tobacco: Never Used  Substance and Sexual Activity  . Alcohol use: No    Alcohol/week: 0.0 standard drinks  . Drug use: No  . Sexual activity: Yes  Lifestyle  . Physical activity:    Days per week: Not on file    Minutes per session: Not on file  . Stress:  Not on file  Relationships  . Social connections:    Talks on phone: Not on file    Gets together: Not on file    Attends religious service: Not on file    Active member of club or organization: Not on file    Attends meetings of clubs or organizations: Not on file    Relationship status: Not on file  . Intimate partner violence:    Fear of current or ex partner: Not on file    Emotionally abused: Not on file    Physically abused: Not on file    Forced sexual activity: Not on file  Other Topics Concern  . Not on file  Social History Narrative  . Not on file    Outpatient Medications Prior to Visit  Medication Sig Dispense Refill  . aspirin 81 MG tablet Take 81 mg by mouth daily.    Marland Kitchen atorvastatin (LIPITOR) 40 MG tablet TAKE 1 TABLET BY MOUTH EVERYDAY AT BEDTIME 30 tablet 0  . Coenzyme Q10 (CO Q 10 PO) Take 1 capsule by mouth at bedtime.     . colchicine 0.6 MG tablet TAKE 1 TABLET BY MOUTH AT BEDTIME. 30 tablet 0  . Fish Oil-Cholecalciferol (FISH OIL + D3 PO) Take 1 capsule by mouth daily.     Marland Kitchen lisinopril (PRINIVIL,ZESTRIL) 20 MG tablet TAKE 1 TABLET BY MOUTH EVERY DAY 90 tablet 0  . loratadine (CLARITIN) 10 MG tablet Take 10 mg by mouth daily.    . metFORMIN (GLUCOPHAGE) 500 MG tablet TAKE 1 TABLET BY MOUTH EVERY DAY WITH BREAKFAST 30 tablet 0  . Multiple Vitamin (MULTIVITAMIN) tablet Take 1 tablet by mouth daily. Reported on 12/20/2015    . naphazoline-pheniramine (NAPHCON-A) 0.025-0.3 % ophthalmic solution Place 1 drop into both eyes 4 (four) times daily as needed for eye irritation. 15 mL 0  . pantoprazole (PROTONIX) 40 MG tablet Take 1 tablet (40 mg total) by mouth daily as needed. 90 tablet 3  . escitalopram (LEXAPRO) 5 MG tablet Take 0.5 tablets (2.5 mg total) by mouth at bedtime. 45 tablet 3  . calcium carbonate (TUMS - DOSED IN MG ELEMENTAL CALCIUM) 500 MG chewable tablet Chew 1-2 tablets by mouth 2 (two) times daily as needed for indigestion or heartburn.     No  facility-administered medications prior to visit.  No Known Allergies  ROS Review of Systems Review of Systems  Constitutional: Negative for activity change, appetite change, chills and fever.  HENT: Negative for congestion, nosebleeds, trouble swallowing and voice change.   Respiratory: Negative for cough, shortness of breath and wheezing.   Gastrointestinal: Negative for diarrhea, nausea and vomiting.  Genitourinary: Negative for difficulty urinating, dysuria, flank pain and hematuria.  Musculoskeletal: Negative for back pain, joint swelling and neck pain.  Neurological: Negative for dizziness, speech difficulty, light-headedness and numbness.  See HPI. All other review of systems negative.     Objective:    Physical Exam  BP 138/79 (BP Location: Right Arm, Patient Position: Sitting, Cuff Size: Normal)   Pulse (!) 54   Resp 16   Ht 5\' 8"  (1.727 m)   Wt 176 lb (79.8 kg)   SpO2 98%   BMI 26.76 kg/m  Wt Readings from Last 3 Encounters:  01/28/19 176 lb (79.8 kg)  11/29/18 176 lb 12.8 oz (80.2 kg)  05/04/18 177 lb (80.3 kg)   Physical Exam  Constitutional: Oriented to person, place, and time. Appears well-developed and well-nourished.  HENT:  Head: Normocephalic and atraumatic.  Eyes: Conjunctivae and EOM are normal.  Cardiovascular: Normal rate, regular rhythm, normal heart sounds and intact distal pulses.  No murmur heard. Pulmonary/Chest: Effort normal and breath sounds normal. No stridor. No respiratory distress. Has no wheezes.  Neurological: Is alert and oriented to person, place, and time.  Skin: Skin is warm. Capillary refill takes less than 2 seconds.  Psychiatric: Has a normal mood and affect. Behavior is normal. Judgment and thought content normal.    Health Maintenance Due  Topic Date Due  . OPHTHALMOLOGY EXAM  03/19/2018  . FOOT EXAM  04/30/2018  . HEMOGLOBIN A1C  05/25/2018    There are no preventive care reminders to display for this patient.   Lab Results  Component Value Date   TSH 0.917 11/25/2017   Lab Results  Component Value Date   WBC 6.1 11/25/2017   HGB 14.7 11/25/2017   HCT 43.5 11/25/2017   MCV 83 11/25/2017   PLT 313 11/25/2017   Lab Results  Component Value Date   NA 140 11/25/2017   K 4.5 11/25/2017   CO2 22 11/25/2017   GLUCOSE 215 (H) 11/25/2017   BUN 10 11/25/2017   CREATININE 0.89 11/25/2017   BILITOT 0.5 11/25/2017   ALKPHOS 117 11/25/2017   AST 17 11/25/2017   ALT 40 11/25/2017   PROT 7.0 11/25/2017   ALBUMIN 4.5 11/25/2017   CALCIUM 9.5 11/25/2017   ANIONGAP 7 09/15/2017   GFR 76.92 03/29/2015   Lab Results  Component Value Date   CHOL 126 11/25/2017   Lab Results  Component Value Date   HDL 45 11/25/2017   Lab Results  Component Value Date   LDLCALC 64 11/25/2017   Lab Results  Component Value Date   TRIG 83 11/25/2017   Lab Results  Component Value Date   CHOLHDL 2.8 11/25/2017   Lab Results  Component Value Date   HGBA1C 7.2 (A) 01/28/2019      Assessment & Plan:   Problem List Items Addressed This Visit      Cardiovascular and Mediastinum    - Patient's blood pressure is at goal of 130/80 or less. Condition is stable. Continue current medications and treatment plan. I recommend that you exercise for 30-45 minutes 5 days a week. I also recommend a balanced diet with fruits and vegetables every day, lean  meats, and little fried foods. The DASH diet (you can find this online) is a good example of this.      Other   Nervous irritability  - increase dose to help with mood Discussed how to titrate up dose   Relevant Medications   escitalopram (LEXAPRO) 5 MG tablet    Other Visit Diagnoses    Gastroesophageal reflux disease without esophagitis    -  Primary- continue as needed meds Limit triggers    Type 2 diabetes mellitus with hyperglycemia, without long-term current use of insulin (HCC)    - a1c goal of 7% or less Patient motivated to continue exercise  Discussed some dietary changes and avoiding starchy foods   Relevant Orders   POCT glycosylated hemoglobin (Hb A1C) (Completed)    Pericarditis Discussed that he has a chronic case Discussed that if Dr. Einar Gip recommended it then it was medically necessary to be on colchicine Discussed that many people take colchicine for gout and that he should not think of it as detrimental to be on this medication  Meds ordered this encounter  Medications  . escitalopram (LEXAPRO) 5 MG tablet    Sig: Take 1 tablet (5 mg total) by mouth at bedtime.    Dispense:  90 tablet    Refill:  3    Follow-up: No follow-ups on file.    Forrest Moron, MD

## 2019-01-29 LAB — COMPREHENSIVE METABOLIC PANEL
ALT: 34 IU/L (ref 0–44)
AST: 19 IU/L (ref 0–40)
Albumin/Globulin Ratio: 2.1 (ref 1.2–2.2)
Albumin: 4.7 g/dL (ref 3.8–4.9)
Alkaline Phosphatase: 97 IU/L (ref 39–117)
BUN/Creatinine Ratio: 13 (ref 9–20)
BUN: 13 mg/dL (ref 6–24)
Bilirubin Total: 0.7 mg/dL (ref 0.0–1.2)
CO2: 24 mmol/L (ref 20–29)
Calcium: 10 mg/dL (ref 8.7–10.2)
Chloride: 101 mmol/L (ref 96–106)
Creatinine, Ser: 0.99 mg/dL (ref 0.76–1.27)
GFR calc Af Amer: 100 mL/min/{1.73_m2} (ref >59)
GFR calc non Af Amer: 87 mL/min/{1.73_m2} (ref >59)
Globulin, Total: 2.2 g/dL (ref 1.5–4.5)
Glucose: 144 mg/dL — ABNORMAL HIGH (ref 65–99)
Potassium: 4.7 mmol/L (ref 3.5–5.2)
Sodium: 141 mmol/L (ref 134–144)
Total Protein: 6.9 g/dL (ref 6.0–8.5)

## 2019-01-29 LAB — LIPID PANEL
Chol/HDL Ratio: 2.9 ratio (ref 0.0–5.0)
Cholesterol, Total: 141 mg/dL (ref 100–199)
HDL: 49 mg/dL (ref >39)
LDL Calculated: 79 mg/dL (ref 0–99)
Triglycerides: 67 mg/dL (ref 0–149)
VLDL Cholesterol Cal: 13 mg/dL (ref 5–40)

## 2019-01-29 LAB — TSH: TSH: 1.26 u[IU]/mL (ref 0.450–4.500)

## 2019-01-29 LAB — HEMOGLOBIN A1C
Est. average glucose Bld gHb Est-mCnc: 146 mg/dL
Hgb A1c MFr Bld: 6.7 % — ABNORMAL HIGH (ref 4.8–5.6)

## 2019-01-29 LAB — MICROALBUMIN, URINE: Microalbumin, Urine: 6 ug/mL

## 2019-02-13 ENCOUNTER — Other Ambulatory Visit: Payer: Self-pay | Admitting: Family Medicine

## 2019-02-13 DIAGNOSIS — E119 Type 2 diabetes mellitus without complications: Secondary | ICD-10-CM

## 2019-02-13 DIAGNOSIS — I10 Essential (primary) hypertension: Secondary | ICD-10-CM

## 2019-02-13 MED ORDER — ATORVASTATIN CALCIUM 40 MG PO TABS: ORAL_TABLET | ORAL | 3 refills | Status: DC

## 2019-02-13 MED ORDER — METFORMIN HCL 500 MG PO TABS: ORAL_TABLET | ORAL | 1 refills | Status: DC

## 2019-02-18 ENCOUNTER — Other Ambulatory Visit: Payer: Self-pay | Admitting: Physician Assistant

## 2019-02-18 DIAGNOSIS — K219 Gastro-esophageal reflux disease without esophagitis: Secondary | ICD-10-CM

## 2019-02-19 ENCOUNTER — Other Ambulatory Visit: Payer: Self-pay | Admitting: Family Medicine

## 2019-02-19 DIAGNOSIS — I319 Disease of pericardium, unspecified: Secondary | ICD-10-CM

## 2019-03-11 ENCOUNTER — Other Ambulatory Visit: Payer: Self-pay | Admitting: Family Medicine

## 2019-03-11 DIAGNOSIS — I319 Disease of pericardium, unspecified: Secondary | ICD-10-CM

## 2019-03-20 ENCOUNTER — Other Ambulatory Visit: Payer: Self-pay | Admitting: Physician Assistant

## 2019-03-20 DIAGNOSIS — I10 Essential (primary) hypertension: Secondary | ICD-10-CM

## 2019-03-22 NOTE — Telephone Encounter (Signed)
Saw Nolon Rod 01/2019

## 2019-05-30 ENCOUNTER — Encounter: Payer: Self-pay | Admitting: Emergency Medicine

## 2019-05-30 ENCOUNTER — Ambulatory Visit: Payer: 59 | Admitting: Emergency Medicine

## 2019-05-30 ENCOUNTER — Other Ambulatory Visit: Payer: Self-pay

## 2019-05-30 VITALS — BP 154/78 | HR 56 | Temp 97.8°F | Resp 16

## 2019-05-30 DIAGNOSIS — I319 Disease of pericardium, unspecified: Secondary | ICD-10-CM

## 2019-05-30 DIAGNOSIS — R21 Rash and other nonspecific skin eruption: Secondary | ICD-10-CM | POA: Diagnosis not present

## 2019-05-30 MED ORDER — TRIAMCINOLONE ACETONIDE 0.1 % EX CREA: 1.0000 "application " | TOPICAL_CREAM | Freq: Two times a day (BID) | CUTANEOUS | 0 refills | Status: DC

## 2019-05-30 MED ORDER — DOXYCYCLINE HYCLATE 100 MG PO TABS: 100.0000 mg | ORAL_TABLET | Freq: Two times a day (BID) | ORAL | 0 refills | Status: AC

## 2019-05-30 MED ORDER — PREDNISONE 20 MG PO TABS: 20.0000 mg | ORAL_TABLET | Freq: Every day | ORAL | 0 refills | Status: AC

## 2019-05-30 MED ORDER — COLCHICINE 0.6 MG PO TABS: ORAL_TABLET | ORAL | 1 refills | Status: DC

## 2019-05-30 NOTE — Patient Instructions (Addendum)
   If you have lab work done today you will be contacted with your lab results within the next 2 weeks.  If you have not heard from us then please contact us. The fastest way to get your results is to register for My Chart.   IF you received an x-ray today, you will receive an invoice from [facility]. Please contact [facility] at [phone] with questions or concerns regarding your invoice.   IF you received labwork today, you will receive an invoice from [facility]. Please contact [facility] at [phone] with questions or concerns regarding your invoice.   Our billing staff will not be able to assist you with questions regarding bills from these companies.  You will be contacted with the lab results as soon as they are available. The fastest way to get your results is to activate your My Chart account. Instructions are located on the last page of this paperwork. If you have not heard from us regarding the results in 2 weeks, please contact this office.     Rash, Adult  A rash is a change in the color of your skin. A rash can also change the way your skin feels. There are many different conditions and factors that can cause a rash. Follow these instructions at home: The goal of treatment is to stop the itching and keep the rash from spreading. Watch for any changes in your symptoms. Let your doctor know about them. Follow these instructions to help with your condition: Medicine Take or apply over-the-counter and prescription medicines only as told by your doctor. These may include medicines:  To treat red or swollen skin (corticosteroid creams).  To treat itching.  To treat an allergy (oral antihistamines).  To treat very bad symptoms (oral corticosteroids).  Skin care  Put cool cloths (compresses) on the affected areas.  Do not scratch or rub your skin.  Avoid covering the rash. Make sure that the rash is exposed to air as much as possible. Managing  itching and discomfort  Avoid hot showers or baths. These can make itching worse. A cold shower may help.  Try taking a bath with: ? Epsom salts. You can get these at your local pharmacy or grocery store. Follow the instructions on the package. ? Baking soda. Pour a small amount into the bath as told by your doctor. ? Colloidal oatmeal. You can get this at your local pharmacy or grocery store. Follow the instructions on the package.  Try putting baking soda paste onto your skin. Stir water into baking soda until it gets like a paste.  Try putting on a lotion that relieves itchiness (calamine lotion).  Keep cool and out of the sun. Sweating and being hot can make itching worse. General instructions   Rest as needed.  Drink enough fluid to keep your pee (urine) pale yellow.  Wear loose-fitting clothing.  Avoid scented soaps, detergents, and perfumes. Use gentle soaps, detergents, perfumes, and other cosmetic products.  Avoid anything that causes your rash. Keep a journal to help track what causes your rash. Write down: ? What you eat. ? What cosmetic products you use. ? What you drink. ? What you wear. This includes jewelry.  Keep all follow-up visits as told by your doctor. This is important. Contact a doctor if:  You sweat at night.  You lose weight.  You pee (urinate) more than normal.  You pee less than normal, or you notice that your pee is a darker color than   normal.  You feel weak.  You throw up (vomit).  Your skin or the whites of your eyes look yellow (jaundice).  Your skin: ? Tingles. ? Is numb.  Your rash: ? Does not go away after a few days. ? Gets worse.  You are: ? More thirsty than normal. ? More tired than normal.  You have: ? New symptoms. ? Pain in your belly (abdomen). ? A fever. ? Watery poop (diarrhea). Get help right away if:  You have a fever and your symptoms suddenly get worse.  You start to feel mixed up (confused).  You  have a very bad headache or a stiff neck.  You have very bad joint pains or stiffness.  You have jerky movements that you cannot control (seizure).  Your rash covers all or most of your body. The rash may or may not be painful.  You have blisters that: ? Are on top of the rash. ? Grow larger. ? Grow together. ? Are painful. ? Are inside your nose or mouth.  You have a rash that: ? Looks like purple pinprick-sized spots all over your body. ? Has a "bull's eye" or looks like a target. ? Is red and painful, causes your skin to peel, and is not from being in the sun too long. Summary  A rash is a change in the color of your skin. A rash can also change the way your skin feels.  The goal of treatment is to stop the itching and keep the rash from spreading.  Take or apply over-the-counter and prescription medicines only as told by your doctor.  Contact a doctor if you have new symptoms or symptoms that get worse.  Keep all follow-up visits as told by your doctor. This is important. This information is not intended to replace advice given to you by your health care provider. Make sure you discuss any questions you have with your health care provider. Document Released: 03/17/2008 Document Revised: 01/21/2019 Document Reviewed: 05/03/2018 Elsevier Patient Education  2020 Elsevier Inc.  

## 2019-05-30 NOTE — Progress Notes (Signed)
[name] [name] 53 y.o.   Chief Complaint  Patient presents with  . Rash on left arm and some on butt reagion    started thursday afternoon (maybe insects underskin)    HISTORY OF PRESENT ILLNESS: This is a 53 y.o. male complaining of itchy rash on his left arm that started 5 days ago.  Unsure what is causing this.  No associated symptoms. 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   HPI   Prior to Admission medications   Medication Sig Start Date End Date Taking? Authorizing Provider  aspirin 81 MG tablet Take 81 mg by mouth daily.   Yes [provider]  atorvastatin (LIPITOR) 40 MG tablet TAKE 1 TABLET BY MOUTH EVERYDAY AT BEDTIME 02/13/19  Yes Stallings, Zoe A, MD  calcium carbonate (TUMS - DOSED IN MG ELEMENTAL CALCIUM) 500 MG chewable tablet Chew 1-2 tablets by mouth 2 (two) times daily as needed for indigestion or heartburn.   Yes [provider]  Coenzyme Q10 (CO Q 10 PO) Take 1 capsule by mouth at bedtime.    Yes [provider]  colchicine 0.6 MG tablet TAKE 1 TABLET BY MOUTH EVERYDAY AT BEDTIME 03/11/19  Yes Stallings, Zoe A, MD  escitalopram (LEXAPRO) 5 MG tablet Take 1 tablet (5 mg total) by mouth at bedtime. 01/28/19  Yes Forrest Moron, MD  Fish Oil-Cholecalciferol (FISH OIL + D3 PO) Take 1 capsule by mouth daily.    Yes [provider]  lisinopril (ZESTRIL) 20 MG tablet TAKE 1 TABLET BY MOUTH EVERY DAY 03/21/19  Yes Stallings, Zoe A, MD  loratadine (CLARITIN) 10 MG tablet Take 10 mg by mouth daily.   Yes [provider]  metFORMIN (GLUCOPHAGE) 500 MG tablet TAKE 1 TABLET BY MOUTH EVERY DAY WITH BREAKFAST 02/13/19  Yes Stallings, Zoe A, MD  Multiple Vitamin (MULTIVITAMIN) tablet Take 1 tablet by mouth daily. Reported on 12/20/2015   Yes [provider]  naphazoline-pheniramine (NAPHCON-A) 0.025-0.3 % ophthalmic solution Place 1 drop into both eyes 4 (four) times daily as needed for eye  irritation. 05/04/18  Yes McVey, Gelene Mink, PA-C  pantoprazole (PROTONIX) 40 MG tablet TAKE 1 TABLET BY MOUTH DAILY AS NEEDED. 02/21/19  Yes Forrest Moron, MD    No Known Allergies  Patient Active Problem List   Diagnosis Date Noted  . Chronic pericarditis 11/28/2017  . Hypertension 11/28/2017  . Well controlled type 2 diabetes mellitus ([place]) 03/19/2017    Past Medical History:  Diagnosis Date  . Allergy   . Arthritis   . Asthma   . Heart murmur   . Hypertension   . Kidney stones   . Lyme disease   . Pericarditis   . Pneumonia     Past Surgical History:  Procedure Laterality Date  . HERNIA REPAIR      Social History   Socioeconomic History  . Marital status: Married    Spouse name: Not on file  . Number of children: Not on file  . Years of education: Not on file  . Highest education level: Not on file  Occupational History  . Occupation: Glass blower/designer  Social Needs  . Financial resource strain: Not on file  . Food insecurity    Worry: Not on file    Inability: Not on file  . Transportation needs    Medical: Not on file    Non-medical: Not on file  Tobacco Use  . Smoking status: Never Smoker  . Smokeless  tobacco: Never Used  Substance and Sexual Activity  . Alcohol use: No    Alcohol/week: 0.0 standard drinks  . Drug use: No  . Sexual activity: Yes  Lifestyle  . Physical activity    Days per week: Not on file    Minutes per session: Not on file  . Stress: Not on file  Relationships  . Social Herbalist on phone: Not on file    Gets together: Not on file    Attends religious service: Not on file    Active member of club or organization: Not on file    Attends meetings of clubs or organizations: Not on file    Relationship status: Not on file  . Intimate partner violence    Fear of current or ex partner: Not on file    Emotionally abused: Not on file    Physically abused: Not on file    Forced sexual activity: Not on file   Other Topics Concern  . Not on file  Social History Narrative  . Not on file    Family History  Problem Relation Age of Onset  . Diabetes Mother   . Cancer Mother        CHF  . Heart disease Mother        CHF  . Asthma Mother   . Lung cancer Mother        smoked  . Cancer Father        prostate  . Diabetes Father   . Kidney disease Father        CANCER  . Stroke Daughter   . Diabetes Maternal Grandmother   . Heart disease Maternal Grandfather        HEART ATTACK  . Colon cancer Neg Hx      Review of Systems  Constitutional: Negative.  Negative for chills and fever.  HENT: Negative.  Negative for congestion and sore throat.   Eyes: Negative.   Respiratory: Negative.  Negative for cough and shortness of breath.   Cardiovascular: Negative.  Negative for chest pain and palpitations.  Gastrointestinal: Negative for abdominal pain, diarrhea, nausea and vomiting.  Genitourinary: Negative.   Musculoskeletal: Negative for myalgias.  Skin: Positive for itching and rash.  Neurological: Negative for dizziness and headaches.  All other systems reviewed and are negative.    Vitals:   05/30/19 1428  BP: (!) 154/78  Pulse: (!) 56  Resp: 16  Temp: 97.8 F (36.6 C)  SpO2: 98%     Physical Exam Vitals signs reviewed.  Constitutional:      Appearance: Normal appearance.  HENT:     Head: Normocephalic.  Eyes:     Extraocular Movements: Extraocular movements intact.     Pupils: Pupils are equal, round, and reactive to light.  Neck:     Musculoskeletal: Normal range of motion and neck supple.  Cardiovascular:     Rate and Rhythm: Normal rate and regular rhythm.     Heart sounds: Normal heart sounds.  Pulmonary:     Effort: Pulmonary effort is normal.     Breath sounds: Normal breath sounds.  Musculoskeletal: Normal range of motion.  Skin:    General: Skin is warm and dry.     Capillary Refill: Capillary refill takes less than 2 seconds.     Findings: Rash present.      Comments: Left upper extremity: Maculopapular rash.  No bruising.  No swelling.  Full range of motion.  Neurological:  General: No focal deficit present.     Mental Status: He is alert and oriented to person, place, and time.  Psychiatric:        Mood and Affect: Mood normal.        Behavior: Behavior normal.      ASSESSMENT & PLAN: [place] was seen today for rash on left arm and some on butt reagion.  Diagnoses and all orders for this visit:  Rash and nonspecific skin eruption -     predniSONE (DELTASONE) 20 MG tablet; Take 1 tablet (20 mg total) by mouth daily with breakfast for 5 days. -     triamcinolone cream (KENALOG) 0.1 %; Apply 1 application topically 2 (two) times daily. -     doxycycline (VIBRA-TABS) 100 MG tablet; Take 1 tablet (100 mg total) by mouth 2 (two) times daily for 5 days.    Patient Instructions       If you have lab work done today you will be contacted with your lab results within the next 2 weeks.  If you have not heard from [place] then please contact us. The fastest way to get your results is to register for My Chart.   IF you received an x-ray today, you will receive an invoice from [facility]. Please contact [facility] at [phone] with questions or concerns regarding your invoice.   IF you received labwork today, you will receive an invoice from [facility]. Please contact [facility] at [phone] with questions or concerns regarding your invoice.   Our billing staff will not be able to assist you with questions regarding bills from these companies.  You will be contacted with the lab results as soon as they are available. The fastest way to get your results is to activate your My Chart account. Instructions are located on the last page of this paperwork. If you have not heard from [place] regarding the results in 2 weeks, please contact this office.    Rash, Adult  A rash is a change in the color of your skin. A rash can  also change the way your skin feels. There are many different conditions and factors that can cause a rash. Follow these instructions at home: The goal of treatment is to stop the itching and keep the rash from spreading. Watch for any changes in your symptoms. Let your doctor know about them. Follow these instructions to help with your condition: Medicine Take or apply over-the-counter and prescription medicines only as told by your doctor. These may include medicines:  To treat red or swollen skin (corticosteroid creams).  To treat itching.  To treat an allergy (oral antihistamines).  To treat very bad symptoms (oral corticosteroids).  Skin care  Put cool cloths (compresses) on the affected areas.  Do not scratch or rub your skin.  Avoid covering the rash. Make sure that the rash is exposed to air as much as possible. Managing itching and discomfort  Avoid hot showers or baths. These can make itching worse. A cold shower may help.  Try taking a bath with: ? Epsom salts. You can get these at your local pharmacy or grocery store. Follow the instructions on the package. ? Baking soda. Pour a small amount into the bath as told by your doctor. ? Colloidal oatmeal. You can get this at your local pharmacy or grocery store. Follow the instructions on the package.  Try putting baking soda paste onto your skin. Stir water into baking soda until it gets like a paste.  Try putting on a lotion that relieves itchiness (calamine lotion).  Keep cool and out of the sun. Sweating and being hot can make itching worse. General instructions   Rest as needed.  Drink enough fluid to keep your pee (urine) pale yellow.  Wear loose-fitting clothing.  Avoid scented soaps, detergents, and perfumes. Use gentle soaps, detergents, perfumes, and other cosmetic products.  Avoid anything that causes your rash. Keep a journal to help track what causes your rash. Write down: ? What you eat. ? What  cosmetic products you use. ? What you drink. ? What you wear. This includes jewelry.  Keep all follow-up visits as told by your doctor. This is important. Contact a doctor if:  You sweat at night.  You lose weight.  You pee (urinate) more than normal.  You pee less than normal, or you notice that your pee is a darker color than normal.  You feel weak.  You throw up (vomit).  Your skin or the whites of your eyes look yellow (jaundice).  Your skin: ? Tingles. ? Is numb.  Your rash: ? Does not go away after a few days. ? Gets worse.  You are: ? More thirsty than normal. ? More tired than normal.  You have: ? New symptoms. ? Pain in your belly (abdomen). ? A fever. ? Watery poop (diarrhea). Get help right away if:  You have a fever and your symptoms suddenly get worse.  You start to feel mixed up (confused).  You have a very bad headache or a stiff neck.  You have very bad joint pains or stiffness.  You have jerky movements that you cannot control (seizure).  Your rash covers all or most of your body. The rash may or may not be painful.  You have blisters that: ? Are on top of the rash. ? Grow larger. ? Grow together. ? Are painful. ? Are inside your nose or mouth.  You have a rash that: ? Looks like purple pinprick-sized spots all over your body. ? Has a "bull's eye" or looks like a target. ? Is red and painful, causes your skin to peel, and is not from being in the sun too long. Summary  A rash is a change in the color of your skin. A rash can also change the way your skin feels.  The goal of treatment is to stop the itching and keep the rash from spreading.  Take or apply over-the-counter and prescription medicines only as told by your doctor.  Contact a doctor if you have new symptoms or symptoms that get worse.  Keep all follow-up visits as told by your doctor. This is important. This information is not intended to replace advice given to you  by your health care provider. Make sure you discuss any questions you have with your health care provider. Document Released: 03/17/2008 Document Revised: 01/21/2019 Document Reviewed: 05/03/2018 Elsevier Patient Education  2020 [employer]vier Inc.      Agustina Caroli, MD [facility]

## 2019-06-12 ENCOUNTER — Other Ambulatory Visit: Payer: Self-pay | Admitting: Family Medicine

## 2019-06-12 DIAGNOSIS — I10 Essential (primary) hypertension: Secondary | ICD-10-CM

## 2019-08-07 ENCOUNTER — Other Ambulatory Visit: Payer: Self-pay | Admitting: Family Medicine

## 2019-08-07 DIAGNOSIS — E119 Type 2 diabetes mellitus without complications: Secondary | ICD-10-CM

## 2019-08-09 ENCOUNTER — Ambulatory Visit: Payer: 59 | Admitting: Family Medicine

## 2019-08-09 ENCOUNTER — Other Ambulatory Visit: Payer: Self-pay

## 2019-08-09 ENCOUNTER — Encounter: Payer: Self-pay | Admitting: Family Medicine

## 2019-08-09 VITALS — BP 122/72 | HR 56 | Temp 97.0°F | Ht 67.0 in | Wt 174.6 lb

## 2019-08-09 DIAGNOSIS — I10 Essential (primary) hypertension: Secondary | ICD-10-CM | POA: Diagnosis not present

## 2019-08-09 DIAGNOSIS — E1165 Type 2 diabetes mellitus with hyperglycemia: Secondary | ICD-10-CM | POA: Diagnosis not present

## 2019-08-09 DIAGNOSIS — Z23 Encounter for immunization: Secondary | ICD-10-CM | POA: Diagnosis not present

## 2019-08-09 DIAGNOSIS — I319 Disease of pericardium, unspecified: Secondary | ICD-10-CM | POA: Diagnosis not present

## 2019-08-09 MED ORDER — LISINOPRIL 20 MG PO TABS: 20.0000 mg | ORAL_TABLET | Freq: Every day | ORAL | 1 refills | Status: DC

## 2019-08-09 NOTE — Progress Notes (Signed)
Established Patient Office Visit  Subjective:  Patient ID: [name] [name], male    DOB: 06-29-1966  Age: 53 y.o. MRN: [id number]  CC:  Chief Complaint  Patient presents with  . Medical Management of Chronic Issues    6 month f/u on chronic medical condition    HPI [name] [name] presents for    Diabetes Mellitus: Patient presents for follow up of diabetes. Symptoms: none. Symptoms have gradually improved. Patient denies foot ulcerations, hypoglycemia , increase appetite, nausea, paresthesia of the feet, polydipsia and polyuria.  Evaluation to date has been included: hemoglobin A1C.  Home sugars: patient does not check sugars. Treatment to date: Continued metformin which has been effective.  Lab Results  Component Value Date   HGBA1C 6.7 (H) 01/28/2019    GERD Patient reports that his GERD is worse if he drinks coffee at night He notices the same thing that he gets reflux with acidic and greasy foods He is taking the Pantoprazole 40mg  as needed and [place] He states that he cut back on fast foods so now when he eats it he gets GERD  Pericarditis He reports that he is exercising by walking to lose weight He is back on colchicine for recurrent pericarditis He sees Dr. Einar Gip He denies current DOE, sob, chest tightness, only has some epigastric pain when he eats greasy  Body mass index is 27.35 kg/m. Wt Readings from Last 3 Encounters:  08/09/19 174 lb 9.6 oz (79.2 kg)  01/28/19 176 lb (79.8 kg)  11/29/18 176 lb 12.8 oz (80.2 kg)     Past Medical History:  Diagnosis Date  . Allergy   . Arthritis   . Asthma   . Heart murmur   . Hypertension   . Kidney stones   . Lyme disease   . Pericarditis   . Pneumonia     Past Surgical History:  Procedure Laterality Date  . HERNIA REPAIR      Family History  Problem Relation Age of Onset  . Diabetes Mother   . Cancer Mother        CHF  . Heart disease Mother        CHF  . Asthma Mother   . Lung cancer Mother    smoked  . Cancer Father        prostate  . Diabetes Father   . Kidney disease Father        CANCER  . Stroke Daughter   . Diabetes Maternal Grandmother   . Heart disease Maternal Grandfather        HEART ATTACK  . Colon cancer Neg Hx     Social History   Socioeconomic History  . Marital status: Married    Spouse name: Not on file  . Number of children: Not on file  . Years of education: Not on file  . Highest education level: Not on file  Occupational History  . Occupation: Glass blower/designer  Social Needs  . Financial resource strain: Not on file  . Food insecurity    Worry: Not on file    Inability: Not on file  . Transportation needs    Medical: Not on file    Non-medical: Not on file  Tobacco Use  . Smoking status: Never Smoker  . Smokeless tobacco: Never Used  Substance and Sexual Activity  . Alcohol use: No    Alcohol/week: 0.0 standard drinks  . Drug use: No  . Sexual activity: Yes  Lifestyle  . Physical activity  Days per week: Not on file    Minutes per session: Not on file  . Stress: Not on file  Relationships  . Social Herbalist on phone: Not on file    Gets together: Not on file    Attends religious service: Not on file    Active member of club or organization: Not on file    Attends meetings of clubs or organizations: Not on file    Relationship status: Not on file  . Intimate partner violence    Fear of current or ex partner: Not on file    Emotionally abused: Not on file    Physically abused: Not on file    Forced sexual activity: Not on file  Other Topics Concern  . Not on file  Social History Narrative  . Not on file    Outpatient Medications Prior to Visit  Medication Sig Dispense Refill  . aspirin 81 MG tablet Take 81 mg by mouth daily.    Marland Kitchen atorvastatin (LIPITOR) 40 MG tablet TAKE 1 TABLET BY MOUTH EVERYDAY AT BEDTIME 90 tablet 3  . calcium carbonate (TUMS - DOSED IN MG ELEMENTAL CALCIUM) 500 MG chewable tablet Chew 1-2  tablets by mouth 2 (two) times daily as needed for indigestion or heartburn.    . Coenzyme Q10 (CO Q 10 PO) Take 1 capsule by mouth at bedtime.     . colchicine 0.6 MG tablet TAKE 1 TABLET BY MOUTH EVERYDAY AT BEDTIME 90 tablet 1  . escitalopram (LEXAPRO) 5 MG tablet Take 1 tablet (5 mg total) by mouth at bedtime. 90 tablet 3  . Fish Oil-Cholecalciferol (FISH OIL + D3 PO) Take 1 capsule by mouth daily.     Marland Kitchen loratadine (CLARITIN) 10 MG tablet Take 10 mg by mouth daily.    . metFORMIN (GLUCOPHAGE) 500 MG tablet TAKE 1 TABLET BY MOUTH EVERY DAY WITH BREAKFAST 90 tablet 1  . Multiple Vitamin (MULTIVITAMIN) tablet Take 1 tablet by mouth daily. Reported on 12/20/2015    . pantoprazole (PROTONIX) 40 MG tablet TAKE 1 TABLET BY MOUTH DAILY AS NEEDED. 90 tablet 3  . lisinopril (ZESTRIL) 20 MG tablet TAKE 1 TABLET BY MOUTH EVERY DAY 90 tablet 0  . naphazoline-pheniramine (NAPHCON-A) 0.025-0.3 % ophthalmic solution Place 1 drop into both eyes 4 (four) times daily as needed for eye irritation. 15 mL 0  . triamcinolone cream (KENALOG) 0.1 % Apply 1 application topically 2 (two) times daily. 30 g 0   No facility-administered medications prior to visit.     No Known Allergies  ROS Review of Systems Review of Systems  Constitutional: Negative for activity change, appetite change, chills and fever.  HENT: Negative for congestion, nosebleeds, trouble swallowing and voice change.   Respiratory: Negative for cough, shortness of breath and wheezing.   Gastrointestinal: Negative for diarrhea, nausea and vomiting.  Genitourinary: Negative for difficulty urinating, dysuria, flank pain and hematuria.  Musculoskeletal: Negative for back pain, joint swelling and neck pain.  Neurological: Negative for dizziness, speech difficulty, light-headedness and numbness.  See HPI. All other review of systems negative.     Objective:    Physical Exam  BP 122/72 (BP Location: Left Arm, Patient Position: Sitting, Cuff Size:  Normal)   Pulse (!) 56   Temp (!) 97 F (36.1 C) (Oral)   Ht 5\' 7"  (1.702 m)   Wt 174 lb 9.6 oz (79.2 kg)   BMI 27.35 kg/m  Wt Readings from Last 3 Encounters:  08/09/19  174 lb 9.6 oz (79.2 kg)  01/28/19 176 lb (79.8 kg)  11/29/18 176 lb 12.8 oz (80.2 kg)   Physical Exam  Constitutional: Oriented to person, place, and time. Appears well-developed and well-nourished.  HENT:  Head: Normocephalic and atraumatic.  Eyes: Conjunctivae and EOM are normal.  Cardiovascular: Normal rate, regular rhythm, normal heart sounds and intact distal pulses.  No murmur heard. Pulmonary/Chest: Effort normal and breath sounds normal. No stridor. No respiratory distress. Has no wheezes.  Abdomen: nondistended, normoactive bs, soft, nontender Neurological: Is alert and oriented to person, place, and time.  Skin: Skin is warm. Capillary refill takes less than 2 seconds.  Psychiatric: Has a normal mood and affect. Behavior is normal. Judgment and thought content normal.    Health Maintenance Due  Topic Date Due  . FOOT EXAM  04/30/2018  . INFLUENZA VACCINE  05/14/2019  . HEMOGLOBIN A1C  07/30/2019    There are no preventive care reminders to display for this patient.  Lab Results  Component Value Date   TSH 1.260 01/28/2019   Lab Results  Component Value Date   WBC 6.1 11/25/2017   HGB 14.7 11/25/2017   HCT 43.5 11/25/2017   MCV 83 11/25/2017   PLT 313 11/25/2017   Lab Results  Component Value Date   NA 141 01/28/2019   K 4.7 01/28/2019   CO2 24 01/28/2019   GLUCOSE 144 (H) 01/28/2019   BUN 13 01/28/2019   CREATININE 0.99 01/28/2019   BILITOT 0.7 01/28/2019   ALKPHOS 97 01/28/2019   AST 19 01/28/2019   ALT 34 01/28/2019   PROT 6.9 01/28/2019   ALBUMIN 4.7 01/28/2019   CALCIUM 10.0 01/28/2019   ANIONGAP 7 09/15/2017   GFR 76.92 03/29/2015   Lab Results  Component Value Date   CHOL 141 01/28/2019   Lab Results  Component Value Date   HDL 49 01/28/2019   Lab Results   Component Value Date   LDLCALC 79 01/28/2019   Lab Results  Component Value Date   TRIG 67 01/28/2019   Lab Results  Component Value Date   CHOLHDL 2.9 01/28/2019   Lab Results  Component Value Date   HGBA1C 6.7 (H) 01/28/2019      Assessment & Plan:   Problem List Items Addressed This Visit      Cardiovascular and Mediastinum   Hypertension - Patient's blood pressure is at goal of 139/89 or less. Condition is stable. Continue current medications and treatment plan. I recommend that you exercise for 30-45 minutes 5 days a week. I also recommend a balanced diet with fruits and vegetables every day, lean meats, and little fried foods. The DASH diet (you can find this online) is a good example of this.    Relevant Medications   lisinopril (ZESTRIL) 20 MG tablet   Other Relevant Orders   Lipid Panel   Comprehensive metabolic panel    Other Visit Diagnoses    Need for prophylactic vaccination and inoculation against influenza    -  Primary   Relevant Orders   Hemoglobin A1c   Type 2 diabetes mellitus with hyperglycemia, without long-term current use of insulin (HCC)   well controlled hemoglobin a1c is at goal Continue exercise On metformin On ace inhibitor lisinopril On asa 81mg  Reviewed diabetic foot care Emphasized importance of eye and dental exam        Relevant Medications   lisinopril (ZESTRIL) 20 MG tablet   Other Relevant Orders   HM Diabetes Foot Exam (Completed)  Hemoglobin A1c   Comprehensive metabolic panel   CBC   Chronic pericarditis, non-rheumatic    -  Discussed his medications, advised continue colchicine   Relevant Medications   lisinopril (ZESTRIL) 20 MG tablet   Other Relevant Orders   CBC      Meds ordered this encounter  Medications  . lisinopril (ZESTRIL) 20 MG tablet    Sig: Take 1 tablet (20 mg total) by mouth daily.    Dispense:  90 tablet    Refill:  1    Follow-up: Return in about 6 months (around 02/07/2020) for DIABETES.     Forrest Moron, MD

## 2019-08-09 NOTE — Patient Instructions (Addendum)
RETURN TO CLINIC IN 6 MONTHS    If you have lab work done today you will be contacted with your lab results within the next 2 weeks.  If you have not heard from [place] then please contact us. The fastest way to get your results is to register for My Chart.   IF you received an x-ray today, you will receive an invoice from [facility]. Please contact [facility] at [phone] with questions or concerns regarding your invoice.   IF you received labwork today, you will receive an invoice from [facility]. Please contact [facility] at [phone] with questions or concerns regarding your invoice.   Our billing staff will not be able to assist you with questions regarding bills from these companies.  You will be contacted with the lab results as soon as they are available. The fastest way to get your results is to activate your My Chart account. Instructions are located on the last page of this paperwork. If you have not heard from [place] regarding the results in 2 weeks, please contact this office.

## 2019-08-10 LAB — COMPREHENSIVE METABOLIC PANEL
ALT: 28 IU/L (ref 0–44)
AST: 10 IU/L (ref 0–40)
Albumin/Globulin Ratio: 1.9 (ref 1.2–2.2)
Albumin: 4.4 g/dL (ref 3.8–4.9)
Alkaline Phosphatase: 97 IU/L (ref 39–117)
BUN/Creatinine Ratio: 13 (ref 9–20)
BUN: 13 mg/dL (ref 6–24)
Bilirubin Total: 0.6 mg/dL (ref 0.0–1.2)
CO2: 23 mmol/L (ref 20–29)
Calcium: 9.7 mg/dL (ref 8.7–10.2)
Chloride: 101 mmol/L (ref 96–106)
Creatinine, Ser: 0.98 mg/dL (ref 0.76–1.27)
GFR calc Af Amer: 101 mL/min/{1.73_m2} (ref >59)
GFR calc non Af Amer: 87 mL/min/{1.73_m2} (ref >59)
Globulin, Total: 2.3 g/dL (ref 1.5–4.5)
Glucose: 147 mg/dL — ABNORMAL HIGH (ref 65–99)
Potassium: 4.4 mmol/L (ref 3.5–5.2)
Sodium: 137 mmol/L (ref 134–144)
Total Protein: 6.7 g/dL (ref 6.0–8.5)

## 2019-08-10 LAB — CBC
Hematocrit: 42.5 % (ref 37.5–51.0)
Hemoglobin: 14.8 g/dL (ref 13.0–17.7)
MCH: 28.7 pg (ref 26.6–33.0)
MCHC: 34.8 g/dL (ref 31.5–35.7)
MCV: 83 fL (ref 79–97)
Platelets: 360 10*3/uL (ref 150–450)
RBC: 5.15 x10E6/uL (ref 4.14–5.80)
RDW: 13.4 % (ref 11.6–15.4)
WBC: 6.2 10*3/uL (ref 3.4–10.8)

## 2019-08-10 LAB — LIPID PANEL
Chol/HDL Ratio: 4.1 ratio (ref 0.0–5.0)
Cholesterol, Total: 207 mg/dL — ABNORMAL HIGH (ref 100–199)
HDL: 51 mg/dL (ref >39)
LDL Chol Calc (NIH): 133 mg/dL — ABNORMAL HIGH (ref 0–99)
Triglycerides: 127 mg/dL (ref 0–149)
VLDL Cholesterol Cal: 23 mg/dL (ref 5–40)

## 2019-08-10 LAB — HEMOGLOBIN A1C
Est. average glucose Bld gHb Est-mCnc: 169 mg/dL
Hgb A1c MFr Bld: 7.5 % — ABNORMAL HIGH (ref 4.8–5.6)

## 2019-11-18 ENCOUNTER — Encounter: Payer: Self-pay | Admitting: Family Medicine

## 2019-11-19 ENCOUNTER — Other Ambulatory Visit: Payer: Self-pay | Admitting: Emergency Medicine

## 2019-11-19 DIAGNOSIS — I319 Disease of pericardium, unspecified: Secondary | ICD-10-CM

## 2019-11-19 NOTE — Telephone Encounter (Signed)
Request approved per protocol.

## 2019-12-02 ENCOUNTER — Other Ambulatory Visit: Payer: Self-pay

## 2019-12-02 ENCOUNTER — Telehealth (INDEPENDENT_AMBULATORY_CARE_PROVIDER_SITE_OTHER): Payer: Managed Care, Other (non HMO) | Admitting: Family Medicine

## 2019-12-02 VITALS — BP 160/80

## 2019-12-02 DIAGNOSIS — G44209 Tension-type headache, unspecified, not intractable: Secondary | ICD-10-CM

## 2019-12-02 DIAGNOSIS — I10 Essential (primary) hypertension: Secondary | ICD-10-CM | POA: Diagnosis not present

## 2019-12-02 MED ORDER — TIZANIDINE HCL 4 MG PO TABS: 4.0000 mg | ORAL_TABLET | Freq: Four times a day (QID) | ORAL | 0 refills | Status: DC | PRN

## 2019-12-02 NOTE — Progress Notes (Signed)
DOXIMITY Encounter- SOAP NOTE Established Patient  This DOXIMITY encounter was conducted with the patient's (or proxy's) verbal consent via audio telecommunications: yes/no: Yes Patient was instructed to have this encounter in a suitably private space; and to only have persons present to whom they give permission to participate. In addition, patient identity was confirmed by use of name plus two identifiers (DOB and address).  I discussed the limitations, risks, security and privacy concerns of performing an evaluation and management service by telephone and the availability of in person appointments. I also discussed with the patient that there may be a patient responsible charge related to this service. The patient expressed understanding and agreed to proceed.  I spent a total of TIME; 0 MIN TO 60 MIN: 15 minutes talking with the patient or their proxy.  Chief Complaint  Patient presents with  . Migraine    and Pt stated that his BP this morning was 160/80. He stated that the headaches has been going on for the past month.    Subjective   [name] [name] is a 54 y.o. established patient. Telephone visit today for  HPI  Headache - new problem Patient reports that he feels like a throbbing headache that is squeezing around the head He states it started suddenly on 11/05/19 He states that he was nauseous from the headache [facility]  His bp was 140/70s  He states that he had a sleep apnea test He had a covid test His mother had a history of stroke and brain cancer The headache is 8/10 at the worst but a constant throbbing with pain at 3/10 He report that his blood pressure was 150-160s/80s The headache made him nauseous for one day.  He is taking motrin, excedrin ha and tylenol.  It has been present for 2 weeks.   The only time he does not feel headache pain is when he is eating.    Hypertension: Patient here for follow-up of elevated blood pressure. He is  not exercising and is adherent to low salt diet.  Blood pressure is not well controlled at home. Cardiac symptoms none. Patient denies chest pressure/discomfort, dyspnea, exertional chest pressure/discomfort, irregular heart beat, near-syncope and orthopnea.   BP Readings from Last 3 Encounters:  12/02/19 (!) 160/80  08/09/19 122/72  05/30/19 (!) 154/78   [facility] pressure was noted to be 138/72 on 11/18/19 Patient has been checking his blood pressure while in pain.   Patient Active Problem List   Diagnosis Date Noted  . Chronic pericarditis 11/28/2017  . Hypertension 11/28/2017  . Well controlled type 2 diabetes mellitus ([place]) 03/19/2017    Past Medical History:  Diagnosis Date  . Allergy   . Arthritis   . Asthma   . Heart murmur   . Hypertension   . Kidney stones   . Lyme disease   . Pericarditis   . Pneumonia     Current Outpatient Medications  Medication Sig Dispense Refill  . aspirin 81 MG tablet Take 81 mg by mouth daily.    Marland Kitchen atorvastatin (LIPITOR) 40 MG tablet TAKE 1 TABLET BY MOUTH EVERYDAY AT BEDTIME 90 tablet 3  . calcium carbonate (TUMS - DOSED IN MG ELEMENTAL CALCIUM) 500 MG chewable tablet Chew 1-2 tablets by mouth 2 (two) times daily as needed for indigestion or heartburn.    . Coenzyme Q10 (CO Q 10 PO) Take 1 capsule by mouth at bedtime.     . colchicine  0.6 MG tablet TAKE 1 TABLET BY MOUTH EVERYDAY AT BEDTIME 90 tablet 0  . escitalopram (LEXAPRO) 5 MG tablet Take 1 tablet (5 mg total) by mouth at bedtime. 90 tablet 3  . Fish Oil-Cholecalciferol (FISH OIL + D3 PO) Take 1 capsule by mouth daily.     Marland Kitchen lisinopril (ZESTRIL) 20 MG tablet Take 1 tablet (20 mg total) by mouth daily. 90 tablet 1  . loratadine (CLARITIN) 10 MG tablet Take 10 mg by mouth daily.    . metFORMIN (GLUCOPHAGE) 500 MG tablet TAKE 1 TABLET BY MOUTH EVERY DAY WITH BREAKFAST 90 tablet 1  . Multiple Vitamin (MULTIVITAMIN) tablet Take 1 tablet by mouth daily. Reported on 12/20/2015     . pantoprazole (PROTONIX) 40 MG tablet TAKE 1 TABLET BY MOUTH DAILY AS NEEDED. 90 tablet 3  . tiZANidine (ZANAFLEX) 4 MG tablet Take 1 tablet (4 mg total) by mouth every 6 (six) hours as needed for muscle spasms. 30 tablet 0   No current facility-administered medications for this visit.    No Known Allergies  Social History   Socioeconomic History  . Marital status: Married    Spouse name: Not on file  . Number of children: Not on file  . Years of education: Not on file  . Highest education level: Not on file  Occupational History  . Occupation: Glass blower/designer  Tobacco Use  . Smoking status: Never Smoker  . Smokeless tobacco: Never Used  Substance and Sexual Activity  . Alcohol use: No    Alcohol/week: 0.0 standard drinks  . Drug use: No  . Sexual activity: Yes  Other Topics Concern  . Not on file  Social History Narrative  . Not on file   Social Determinants of Health   Financial Resource Strain:   . Difficulty of Paying Living Expenses: Not on file  Food Insecurity:   . Worried About Charity fundraiser in the Last Year: Not on file  . Ran Out of Food in the Last Year: Not on file  Transportation Needs:   . Lack of Transportation (Medical): Not on file  . Lack of Transportation (Non-Medical): Not on file  Physical Activity:   . Days of Exercise per Week: Not on file  . Minutes of Exercise per Session: Not on file  Stress:   . Feeling of Stress : Not on file  Social Connections:   . Frequency of Communication with Friends and Family: Not on file  . Frequency of Social Gatherings with Friends and Family: Not on file  . Attends Religious Services: Not on file  . Active Member of Clubs or Organizations: Not on file  . Attends Archivist Meetings: Not on file  . Marital Status: Not on file  Intimate Partner Violence:   . Fear of Current or Ex-Partner: Not on file  . Emotionally Abused: Not on file  . Physically Abused: Not on file  . Sexually  Abused: Not on file    ROS See hpi  Objective   Vitals as reported by the patient: Today's Vitals   12/02/19 0819  BP: (!) 160/80   Physical Exam  Constitutional: Oriented to person, place, and time. Appears well-developed and well-nourished.  HENT:  Head: Normocephalic and atraumatic.  Eyes: Conjunctivae and EOM are normal.  Cardiovascular: unable to assess virtually Pulmonary/Chest: Effort normal. No respiratory distress. Neurological: Is alert and oriented to person, place, and time.  Skin: Skin is warm. Capillary refill takes less than 2 seconds.  Psychiatric: Has a normal mood and affect. Behavior is normal. Judgment and thought content normal.   Eyan was seen today for migraine.  Diagnoses and all orders for this visit:  Essential hypertension- will follow up with patient in 2 weeks, advised patient to start a muscle relaxer and monitor her heaDache then He is awaiting results of his home sleep test of OSA -  Reviewed care everywhere  Tension headache -  Discussed that symptoms are consistent with tension headache -  Discussed ER follow up for neuro signs -  Will do trial of zanaflex and close follow up in 2 weeks Other orders -     tiZANidine (ZANAFLEX) 4 MG tablet; Take 1 tablet (4 mg total) by mouth every 6 (six) hours as needed for muscle spasms.     I discussed the assessment and treatment plan with the patient. The patient was provided an opportunity to ask questions and all were answered. The patient agreed with the plan and demonstrated an understanding of the instructions.   The patient was advised to call back or seek an in-person evaluation if the symptoms worsen or if the condition fails to improve as anticipated.  I provided 15 minutes of DOXIMITY face-to-face time during this encounter.  Forrest Moron, MD  Primary Care at [facility]

## 2019-12-02 NOTE — Patient Instructions (Signed)
° ° ° °  If you have lab work done today you will be contacted with your lab results within the next 2 weeks.  If you have not heard from us then please contact us. The fastest way to get your results is to register for My Chart. ° ° °IF you received an x-ray today, you will receive an invoice from [facility]. Please contact [facility] at [phone] with questions or concerns regarding your invoice.  ° °IF you received labwork today, you will receive an invoice from [facility]. Please contact [facility] at [phone] with questions or concerns regarding your invoice.  ° °Our billing staff will not be able to assist you with questions regarding bills from these companies. ° °You will be contacted with the lab results as soon as they are available. The fastest way to get your results is to activate your My Chart account. Instructions are located on the last page of this paperwork. If you have not heard from us regarding the results in 2 weeks, please contact this office. °  ° ° ° °

## 2019-12-02 NOTE — Progress Notes (Signed)
Spoke with pt and scheduled 2 week f/u

## 2019-12-16 ENCOUNTER — Other Ambulatory Visit: Payer: Self-pay

## 2019-12-16 ENCOUNTER — Encounter: Payer: Self-pay | Admitting: Family Medicine

## 2019-12-16 ENCOUNTER — Ambulatory Visit: Payer: 59 | Admitting: Family Medicine

## 2019-12-16 VITALS — BP 163/86 | HR 86 | Temp 98.0°F | Resp 16 | Ht 67.0 in | Wt 175.0 lb

## 2019-12-16 DIAGNOSIS — R399 Unspecified symptoms and signs involving the genitourinary system: Secondary | ICD-10-CM | POA: Diagnosis not present

## 2019-12-16 DIAGNOSIS — E1165 Type 2 diabetes mellitus with hyperglycemia: Secondary | ICD-10-CM | POA: Diagnosis not present

## 2019-12-16 DIAGNOSIS — R350 Frequency of micturition: Secondary | ICD-10-CM | POA: Diagnosis not present

## 2019-12-16 DIAGNOSIS — R358 Other polyuria: Secondary | ICD-10-CM

## 2019-12-16 DIAGNOSIS — I10 Essential (primary) hypertension: Secondary | ICD-10-CM

## 2019-12-16 DIAGNOSIS — R3589 Other polyuria: Secondary | ICD-10-CM

## 2019-12-16 DIAGNOSIS — E119 Type 2 diabetes mellitus without complications: Secondary | ICD-10-CM

## 2019-12-16 LAB — POCT URINALYSIS DIP (MANUAL ENTRY)
Bilirubin, UA: NEGATIVE
Blood, UA: NEGATIVE
Glucose, UA: NEGATIVE mg/dL
Ketones, POC UA: NEGATIVE mg/dL
Leukocytes, UA: NEGATIVE
Nitrite, UA: NEGATIVE
Protein Ur, POC: NEGATIVE mg/dL
Spec Grav, UA: 1.015 (ref 1.010–1.025)
Urobilinogen, UA: 0.2 E.U./dL
pH, UA: 7.5 (ref 5.0–8.0)

## 2019-12-16 LAB — POCT GLYCOSYLATED HEMOGLOBIN (HGB A1C): Hemoglobin A1C: 7.8 % — AB (ref 4.0–5.6)

## 2019-12-16 MED ORDER — METFORMIN HCL 500 MG PO TABS: 500.0000 mg | ORAL_TABLET | Freq: Two times a day (BID) | ORAL | 1 refills | Status: DC

## 2019-12-16 MED ORDER — LISINOPRIL 40 MG PO TABS: 40.0000 mg | ORAL_TABLET | Freq: Every day | ORAL | 1 refills | Status: DC

## 2019-12-16 NOTE — Progress Notes (Signed)
Established Patient Office Visit  Subjective:  Patient ID: [name] [name], male    DOB: Mar 04, 1966  Age: 54 y.o. MRN: [id number]  CC:  Chief Complaint  Patient presents with  . Headache    2 week f/u.  Per pt ha's better but still has throbbing and tightness.  No otc pain meds being taken for ha's    HPI [name] [name] presents for   Hypertension: Patient here for follow-up of elevated blood pressure. He is not exercising and is adherent to low salt diet.  Blood pressure is well controlled at home. Cardiac symptoms none. Patient denies chest pain, claudication, exertional chest pressure/discomfort, irregular heart beat, lower extremity edema, near-syncope, orthopnea and paroxysmal nocturnal dyspnea.  Cardiovascular risk factors: diabetes mellitus, hypertension and male gender. Use of agents associated with hypertension: none. History of target organ damage: none. BP Readings from Last 3 Encounters:  12/16/19 (!) 163/86  12/02/19 (!) 160/80  08/09/19 122/72    Diabetes Mellitus: Patient presents for follow up of diabetes. Symptoms: none. Symptoms have stabilized. Patient denies foot ulcerations, hypoglycemia , increase appetite, nausea, polydipsia and visual disturbances.  Evaluation to date has been included: hemoglobin A1C.  Home sugars: patient does not check sugars. Treatment to date: more intensive attention to diet which has been effective and Continued metformin which has been somewhat effective.  Lab Results  Component Value Date   HGBA1C 7.5 (H) 08/09/2019    Past Medical History:  Diagnosis Date  . Allergy   . Arthritis   . Asthma   . Heart murmur   . Hypertension   . Kidney stones   . Lyme disease   . Pericarditis   . Pneumonia     Past Surgical History:  Procedure Laterality Date  . HERNIA REPAIR      Family History  Problem Relation Age of Onset  . Diabetes Mother   . Cancer Mother        CHF  . Heart disease Mother        CHF  . Asthma Mother   . Lung  cancer Mother        smoked  . Cancer Father        prostate  . Diabetes Father   . Kidney disease Father        CANCER  . Stroke Daughter   . Diabetes Maternal Grandmother   . Heart disease Maternal Grandfather        HEART ATTACK  . Colon cancer Neg Hx     Social History   Socioeconomic History  . Marital status: Married    Spouse name: Not on file  . Number of children: Not on file  . Years of education: Not on file  . Highest education level: Not on file  Occupational History  . Occupation: Glass blower/designer  Tobacco Use  . Smoking status: Never Smoker  . Smokeless tobacco: Never Used  Substance and Sexual Activity  . Alcohol use: No    Alcohol/week: 0.0 standard drinks  . Drug use: No  . Sexual activity: Yes  Other Topics Concern  . Not on file  Social History Narrative  . Not on file   Social Determinants of Health   Financial Resource Strain:   . Difficulty of Paying Living Expenses: Not on file  Food Insecurity:   . Worried About Charity fundraiser in the Last Year: Not on file  . Ran Out of Food in the Last Year: Not on file  Transportation  Needs:   . Lack of Transportation (Medical): Not on file  . Lack of Transportation (Non-Medical): Not on file  Physical Activity:   . Days of Exercise per Week: Not on file  . Minutes of Exercise per Session: Not on file  Stress:   . Feeling of Stress : Not on file  Social Connections:   . Frequency of Communication with Friends and Family: Not on file  . Frequency of Social Gatherings with Friends and Family: Not on file  . Attends Religious Services: Not on file  . Active Member of Clubs or Organizations: Not on file  . Attends Archivist Meetings: Not on file  . Marital Status: Not on file  Intimate Partner Violence:   . Fear of Current or Ex-Partner: Not on file  . Emotionally Abused: Not on file  . Physically Abused: Not on file  . Sexually Abused: Not on file    Outpatient Medications Prior  to Visit  Medication Sig Dispense Refill  . aspirin 81 MG tablet Take 81 mg by mouth daily.    Marland Kitchen atorvastatin (LIPITOR) 40 MG tablet TAKE 1 TABLET BY MOUTH EVERYDAY AT BEDTIME 90 tablet 3  . calcium carbonate (TUMS - DOSED IN MG ELEMENTAL CALCIUM) 500 MG chewable tablet Chew 1-2 tablets by mouth 2 (two) times daily as needed for indigestion or heartburn.    . Coenzyme Q10 (CO Q 10 PO) Take 1 capsule by mouth at bedtime.     . colchicine 0.6 MG tablet TAKE 1 TABLET BY MOUTH EVERYDAY AT BEDTIME 90 tablet 0  . escitalopram (LEXAPRO) 5 MG tablet Take 1 tablet (5 mg total) by mouth at bedtime. 90 tablet 3  . Fish Oil-Cholecalciferol (FISH OIL + D3 PO) Take 1 capsule by mouth daily.     Marland Kitchen loratadine (CLARITIN) 10 MG tablet Take 10 mg by mouth daily.    . metFORMIN (GLUCOPHAGE) 500 MG tablet TAKE 1 TABLET BY MOUTH EVERY DAY WITH BREAKFAST 90 tablet 1  . Multiple Vitamin (MULTIVITAMIN) tablet Take 1 tablet by mouth daily. Reported on 12/20/2015    . pantoprazole (PROTONIX) 40 MG tablet TAKE 1 TABLET BY MOUTH DAILY AS NEEDED. 90 tablet 3  . tiZANidine (ZANAFLEX) 4 MG tablet Take 1 tablet (4 mg total) by mouth every 6 (six) hours as needed for muscle spasms. 30 tablet 0  . lisinopril (ZESTRIL) 20 MG tablet Take 1 tablet (20 mg total) by mouth daily. 90 tablet 1   No facility-administered medications prior to visit.    No Known Allergies  ROS Review of Systems    Objective:    Physical Exam  BP (!) 163/86 (BP Location: Right Arm, Patient Position: Sitting, Cuff Size: Normal)   Pulse 86   Temp 98 F (36.7 C) (Temporal)   Resp 16   Ht 5\' 7"  (1.702 m)   Wt 175 lb (79.4 kg)   SpO2 98%   BMI 27.41 kg/m  Wt Readings from Last 3 Encounters:  12/16/19 175 lb (79.4 kg)  08/09/19 174 lb 9.6 oz (79.2 kg)  01/28/19 176 lb (79.8 kg)   Physical Exam  Constitutional: Oriented to person, place, and time. Appears well-developed and well-nourished.  HENT:  Head: Normocephalic and atraumatic.  Eyes:  Conjunctivae and EOM are normal.  Cardiovascular: Normal rate, regular rhythm, normal heart sounds and intact distal pulses.  No murmur heard. Pulmonary/Chest: Effort normal and breath sounds normal. No stridor. No respiratory distress. Has no wheezes.  Neurological: Is alert and oriented  to person, place, and time.  Skin: Skin is warm. Capillary refill takes less than 2 seconds.  Psychiatric: Has a normal mood and affect. Behavior is normal. Judgment and thought content normal.    Health Maintenance Due  Topic Date Due  . OPHTHALMOLOGY EXAM  10/29/2019    There are no preventive care reminders to display for this patient.  Lab Results  Component Value Date   TSH 1.260 01/28/2019   Lab Results  Component Value Date   WBC 6.2 08/09/2019   HGB 14.8 08/09/2019   HCT 42.5 08/09/2019   MCV 83 08/09/2019   PLT 360 08/09/2019   Lab Results  Component Value Date   NA 137 08/09/2019   K 4.4 08/09/2019   CO2 23 08/09/2019   GLUCOSE 147 (H) 08/09/2019   BUN 13 08/09/2019   CREATININE 0.98 08/09/2019   BILITOT 0.6 08/09/2019   ALKPHOS 97 08/09/2019   AST 10 08/09/2019   ALT 28 08/09/2019   PROT 6.7 08/09/2019   ALBUMIN 4.4 08/09/2019   CALCIUM 9.7 08/09/2019   ANIONGAP 7 09/15/2017   GFR 76.92 03/29/2015   Lab Results  Component Value Date   CHOL 207 (H) 08/09/2019   Lab Results  Component Value Date   HDL 51 08/09/2019   Lab Results  Component Value Date   LDLCALC 133 (H) 08/09/2019   Lab Results  Component Value Date   TRIG 127 08/09/2019   Lab Results  Component Value Date   CHOLHDL 4.1 08/09/2019   Lab Results  Component Value Date   HGBA1C 7.5 (H) 08/09/2019      .Assessment & Plan:   Problem List Items Addressed This Visit      Cardiovascular and Mediastinum   Hypertension   -uncontrolled based on home bp reading and elevated bp today   Relevant Medications   lisinopril (ZESTRIL) 40 MG tablet   Other Relevant Orders   Comprehensive metabolic  panel   Lipid panel    Other Visit Diagnoses    Type 2 diabetes mellitus with hyperglycemia, without long-term current use of insulin (HCC)    -   Primary - CONTINUE ACE INHIBITOR FOR BP CONTROL With diet and exercise pt should be clloser to goal contue aspirin and statin INCREASED METFORMIN TO BID SINCE A1C HAS INCREASED   Relevant Medications   lisinopril (ZESTRIL) 40 MG tablet   Other Relevant Orders   Comprehensive metabolic panel   POCT glycosylated hemoglobin (Hb A1C)   Frequency of urination and polyuria    -   Discussed that this could be due to hyperglycemia or enlargement of prostate   Relevant Orders   POCT urinalysis dipstick   PSA   Lower urinary tract symptoms (LUTS)    -  Discussed the causes, will rule out UTI, discussed referral to Urology to evaluation for prostates disease   Relevant Orders   Ambulatory referral to Urology      Meds ordered this encounter  Medications  . lisinopril (ZESTRIL) 40 MG tablet    Sig: Take 1 tablet (40 mg total) by mouth daily.    Dispense:  90 tablet    Refill:  1    Follow-up: No follow-ups on file.    Forrest Moron, MD

## 2019-12-16 NOTE — Patient Instructions (Addendum)
   If you have lab work done today you will be contacted with your lab results within the next 2 weeks.  If you have not heard from us then please contact us. The fastest way to get your results is to register for My Chart.   IF you received an x-ray today, you will receive an invoice from [facility]. Please contact [facility] at [phone] with questions or concerns regarding your invoice.   IF you received labwork today, you will receive an invoice from [facility]. Please contact [facility] at [phone] with questions or concerns regarding your invoice.   Our billing staff will not be able to assist you with questions regarding bills from these companies.  You will be contacted with the lab results as soon as they are available. The fastest way to get your results is to activate your My Chart account. Instructions are located on the last page of this paperwork. If you have not heard from us regarding the results in 2 weeks, please contact this office.     Benign Prostatic Hyperplasia  Benign prostatic hyperplasia (BPH) is an enlarged prostate gland that is caused by the normal aging process and not by cancer. The prostate is a walnut-sized gland that is involved in the production of semen. It is located in front of the rectum and below the bladder. The bladder stores urine and the urethra is the tube that carries the urine out of the body. The prostate may get bigger as a man gets older. An enlarged prostate can press on the urethra. This can make it harder to pass urine. The build-up of urine in the bladder can cause infection. Back pressure and infection may progress to bladder damage and kidney (renal) failure. What are the causes? This condition is part of a normal aging process. However, not all men develop problems from this condition. If the prostate enlarges away from the urethra, urine flow will not be blocked. If it enlarges toward the urethra and compresses  it, there will be problems passing urine. What increases the risk? This condition is more likely to develop in men over the age of 50 years. What are the signs or symptoms? Symptoms of this condition include:  Getting up often during the night to urinate.  Needing to urinate frequently during the day.  Difficulty starting urine flow.  Decrease in size and strength of your urine stream.  Leaking (dribbling) after urinating.  Inability to pass urine. This needs immediate treatment.  Inability to completely empty your bladder.  Pain when you pass urine. This is more common if there is also an infection.  Urinary tract infection (UTI). How is this diagnosed? This condition is diagnosed based on your medical history, a physical exam, and your symptoms. Tests will also be done, such as:  A post-void bladder scan. This measures any amount of urine that may remain in your bladder after you finish urinating.  A digital rectal exam. In a rectal exam, your health care provider checks your prostate by putting a lubricated, gloved finger into your rectum to feel the back of your prostate gland. This exam detects the size of your gland and any abnormal lumps or growths.  An exam of your urine (urinalysis).  A prostate specific antigen (PSA) screening. This is a blood test used to screen for prostate cancer.  An ultrasound. This test uses sound waves to electronically produce a picture of your prostate gland. Your health care provider may refer you to   a specialist in kidney and prostate diseases (urologist). How is this treated? Once symptoms begin, your health care provider will monitor your condition (active surveillance or watchful waiting). Treatment for this condition will depend on the severity of your condition. Treatment may include:  Observation and yearly exams. This may be the only treatment needed if your condition and symptoms are mild.  Medicines to relieve your symptoms,  including: ? Medicines to shrink the prostate. ? Medicines to relax the muscle of the prostate.  Surgery in severe cases. Surgery may include: ? Prostatectomy. In this procedure, the prostate tissue is removed completely through an open incision or with a laparoscope or robotics. ? Transurethral resection of the prostate (TURP). In this procedure, a tool is inserted through the opening at the tip of the penis (urethra). It is used to cut away tissue of the inner core of the prostate. The pieces are removed through the same opening of the penis. This removes the blockage. ? Transurethral incision (TUIP). In this procedure, small cuts are made in the prostate. This lessens the prostate's pressure on the urethra. ? Transurethral microwave thermotherapy (TUMT). This procedure uses microwaves to create heat. The heat destroys and removes a small amount of prostate tissue. ? Transurethral needle ablation (TUNA). This procedure uses radio frequencies to destroy and remove a small amount of prostate tissue. ? Interstitial laser coagulation (ILC). This procedure uses a laser to destroy and remove a small amount of prostate tissue. ? Transurethral electrovaporization (TUVP). This procedure uses electrodes to destroy and remove a small amount of prostate tissue. ? Prostatic urethral lift. This procedure inserts an implant to push the lobes of the prostate away from the urethra. Follow these instructions at home:  Take over-the-counter and prescription medicines only as told by your health care provider.  Monitor your symptoms for any changes. Contact your health care provider with any changes.  Avoid drinking large amounts of liquid before going to bed or out in public.  Avoid or reduce how much caffeine or alcohol you drink.  Give yourself time when you urinate.  Keep all follow-up visits as told by your health care provider. This is important. Contact a health care provider if:  You have  unexplained back pain.  Your symptoms do not get better with treatment.  You develop side effects from the medicine you are taking.  Your urine becomes very dark or has a bad smell.  Your lower abdomen becomes distended and you have trouble passing your urine. Get help right away if:  You have a fever or chills.  You suddenly cannot urinate.  You feel lightheaded, or very dizzy, or you faint.  There are large amounts of blood or clots in the urine.  Your urinary problems become hard to manage.  You develop moderate to severe low back or flank pain. The flank is the side of your body between the ribs and the hip. These symptoms may represent a serious problem that is an emergency. Do not wait to see if the symptoms will go away. Get medical help right away. Call your local emergency services (911 in the [place] Do not drive yourself to the hospital. Summary  Benign prostatic hyperplasia (BPH) is an enlarged prostate that is caused by the normal aging process and not by cancer.  An enlarged prostate can press on the urethra. This can make it hard to pass urine.  This condition is part of a normal aging process and is more likely to develop in   men over the age of 50 years.  Get help right away if you suddenly cannot urinate. This information is not intended to replace advice given to you by your health care provider. Make sure you discuss any questions you have with your health care provider. Document Revised: 08/24/2018 Document Reviewed: 11/03/2016 Elsevier Patient Education  2020 [employer]  

## 2019-12-17 LAB — COMPREHENSIVE METABOLIC PANEL
ALT: 36 IU/L (ref 0–44)
AST: 17 IU/L (ref 0–40)
Albumin/Globulin Ratio: 2 (ref 1.2–2.2)
Albumin: 4.9 g/dL (ref 3.8–4.9)
Alkaline Phosphatase: 108 IU/L (ref 39–117)
BUN/Creatinine Ratio: 13 (ref 9–20)
BUN: 12 mg/dL (ref 6–24)
Bilirubin Total: 0.8 mg/dL (ref 0.0–1.2)
CO2: 21 mmol/L (ref 20–29)
Calcium: 10.2 mg/dL (ref 8.7–10.2)
Chloride: 100 mmol/L (ref 96–106)
Creatinine, Ser: 0.93 mg/dL (ref 0.76–1.27)
GFR calc Af Amer: 107 mL/min/{1.73_m2} (ref >59)
GFR calc non Af Amer: 93 mL/min/{1.73_m2} (ref >59)
Globulin, Total: 2.5 g/dL (ref 1.5–4.5)
Glucose: 135 mg/dL — ABNORMAL HIGH (ref 65–99)
Potassium: 4.4 mmol/L (ref 3.5–5.2)
Sodium: 137 mmol/L (ref 134–144)
Total Protein: 7.4 g/dL (ref 6.0–8.5)

## 2019-12-17 LAB — LIPID PANEL
Chol/HDL Ratio: 3.1 ratio (ref 0.0–5.0)
Cholesterol, Total: 148 mg/dL (ref 100–199)
HDL: 47 mg/dL (ref >39)
LDL Chol Calc (NIH): 88 mg/dL (ref 0–99)
Triglycerides: 63 mg/dL (ref 0–149)
VLDL Cholesterol Cal: 13 mg/dL (ref 5–40)

## 2019-12-17 LAB — PSA: Prostate Specific Ag, Serum: 0.9 ng/mL (ref 0.0–4.0)

## 2019-12-20 NOTE — Progress Notes (Signed)
Left message per ROI that his urine did not have any infection. His a1c increased to 7.8 and has steadily increased from 6.7%.  This may be the cause of the increased urination.  Advised to call office with any further questions or concerns.

## 2020-01-19 ENCOUNTER — Ambulatory Visit: Payer: Managed Care, Other (non HMO) | Attending: Internal Medicine

## 2020-01-19 DIAGNOSIS — Z23 Encounter for immunization: Secondary | ICD-10-CM

## 2020-01-19 NOTE — Progress Notes (Signed)
   Covid-19 Vaccination Clinic  Name:  Chipper Gaillard    MRN: [id number] DOB: Feb 13, 1966  01/19/2020  Mr. Kuss was observed post Covid-19 immunization for 15 minutes without incident. He was provided with Vaccine Information Sheet and instruction to access the V-Safe system.   Mr. Bischoff was instructed to call 911 with any severe reactions post vaccine: Marland Kitchen Difficulty breathing  . Swelling of face and throat  . A fast heartbeat  . A bad rash all over body  . Dizziness and weakness   Immunizations Administered    Name Date Dose VIS Date Route   Pfizer COVID-19 Vaccine 01/19/2020 12:32 PM 0.3 mL 09/23/2019 Intramuscular   Manufacturer: [place]   Lot: [id number]   [place]: [id number]

## 2020-01-30 ENCOUNTER — Other Ambulatory Visit: Payer: Self-pay | Admitting: Family Medicine

## 2020-01-30 DIAGNOSIS — R450 Nervousness: Secondary | ICD-10-CM

## 2020-02-01 ENCOUNTER — Ambulatory Visit: Payer: 59 | Admitting: Dermatology

## 2020-02-01 ENCOUNTER — Other Ambulatory Visit: Payer: Self-pay

## 2020-02-01 ENCOUNTER — Encounter: Payer: Self-pay | Admitting: Dermatology

## 2020-02-01 DIAGNOSIS — D225 Melanocytic nevi of trunk: Secondary | ICD-10-CM

## 2020-02-01 DIAGNOSIS — D229 Melanocytic nevi, unspecified: Secondary | ICD-10-CM

## 2020-02-01 DIAGNOSIS — L738 Other specified follicular disorders: Secondary | ICD-10-CM | POA: Diagnosis not present

## 2020-02-01 DIAGNOSIS — D485 Neoplasm of uncertain behavior of skin: Secondary | ICD-10-CM

## 2020-02-05 ENCOUNTER — Encounter: Payer: Self-pay | Admitting: Dermatology

## 2020-02-05 NOTE — Progress Notes (Signed)
   Follow-Up Visit   Subjective  Zacheus Corp is a 54 y.o. male who presents for the following: Annual Exam (couple spots on left temple and left sideburn no bleeding x 1 year).  Holes Location: All over Duration: Years Quality: Stable2 Associated Signs/Symptoms: Modifying Factors:  Severity:  Timing: Context:   The following portions of the chart were reviewed this encounter and updated as appropriate: Tobacco  Allergies  Meds  Problems  Med Hx  Surg Hx  Fam Hx      Objective  Well appearing patient in no apparent distress; mood and affect are within normal limits.  A full examination was performed including scalp, head, eyes, ears, nose, lips, neck, chest, axillae, abdomen, back, buttocks, bilateral upper extremities, bilateral lower extremities, hands, feet, fingers, toes, fingernails, and toenails. All findings within normal limits unless otherwise noted below.   Assessment & Plan  Nevus (2) Left Upper Back; Right Upper Back  Sebaceous hyperplasia Right Malar Cheek

## 2020-02-06 ENCOUNTER — Encounter: Payer: Self-pay | Admitting: Family Medicine

## 2020-02-06 ENCOUNTER — Ambulatory Visit: Payer: 59 | Admitting: Family Medicine

## 2020-02-06 ENCOUNTER — Other Ambulatory Visit: Payer: Self-pay

## 2020-02-06 VITALS — BP 130/74 | HR 60 | Temp 98.2°F | Ht 67.0 in | Wt 170.0 lb

## 2020-02-06 DIAGNOSIS — F411 Generalized anxiety disorder: Secondary | ICD-10-CM

## 2020-02-06 DIAGNOSIS — Z739 Problem related to life management difficulty, unspecified: Secondary | ICD-10-CM | POA: Diagnosis not present

## 2020-02-06 DIAGNOSIS — E1165 Type 2 diabetes mellitus with hyperglycemia: Secondary | ICD-10-CM | POA: Diagnosis not present

## 2020-02-06 NOTE — Patient Instructions (Signed)
° ° ° °  If you have lab work done today you will be contacted with your lab results within the next 2 weeks.  If you have not heard from us then please contact us. The fastest way to get your results is to register for My Chart. ° ° °IF you received an x-ray today, you will receive an invoice from [facility]. Please contact  Radiology at [phone] with questions or concerns regarding your invoice.  ° °IF you received labwork today, you will receive an invoice from [facility]. Please contact [facility] at [phone] with questions or concerns regarding your invoice.  ° °Our billing staff will not be able to assist you with questions regarding bills from these companies. ° °You will be contacted with the lab results as soon as they are available. The fastest way to get your results is to activate your My Chart account. Instructions are located on the last page of this paperwork. If you have not heard from us regarding the results in 2 weeks, please contact this office. °  ° ° ° °

## 2020-02-06 NOTE — Progress Notes (Signed)
Established Patient Office Visit  Subjective:  Patient ID: [name] [name], male    DOB: 07/15/1966  Age: 54 y.o. MRN: [id number]  CC:  Chief Complaint  Patient presents with  . Diabetes    HPI [name] [name] presents for   Diabetes Mellitus: Patient presents for follow up of diabetes. Symptoms: hyperglycemia. Symptoms have stabilized. Patient denies hypoglycemia , increase appetite and nausea.  Evaluation to date has been included: hemoglobin A1C.  Home sugars: patient does not check sugars. Treatment to date: more intensive attention to diet which has been effective and Continued metformin which has been effective.   Component                                    Latest Ref Rng & Units 08/09/2019 12/16/2019  Hemoglobin A1C                                              4.0 - 5.6 % 7.5 (H) 7.8 (A)  Est. average glucose Bld gHb Est-mCnc          mg/dL 169     Anxiety state Patient reports that he stopped lexapro because it made him feel more stressed and overly analytic. He does not want to do any virtual counseling. He thinks his anxiety is getting worse. He is sleeping well. He states that he has increased stress at work.  Home is fine.  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   He checks his blood pressure on the days he works and it is Q000111Q systolic and Q000111Q systolic when he is not working.  He is working harder with fewer personnel to do the work to meet the increased demand.   Depression screen [facility] 2/9 02/06/2020 12/16/2019 12/02/2019 08/09/2019 05/30/2019  Decreased Interest 0 0 0 0 0  Down, Depressed, Hopeless 0 0 - 0 0  PHQ - 2 Score 0 0 0 0 0  Altered sleeping - - - - -  Tired, decreased energy - - - - -  Change in appetite - - - - -  Feeling bad or failure about yourself  - - - - -  Trouble concentrating - - - - -  Moving slowly or fidgety/restless - - - - -  Suicidal thoughts - - - - -  PHQ-9 Score - - -  - -  Difficult doing work/chores - - - - -     Past Medical History:  Diagnosis Date  . Allergy   . Arthritis   . Asthma   . Heart murmur   . Hypertension   . Kidney stones   . Lyme disease   . Pericarditis   . Pneumonia     Past Surgical History:  Procedure Laterality Date  . HERNIA REPAIR      Family History  Problem Relation Age of Onset  . Diabetes Mother   . Cancer Mother        CHF  . Heart disease Mother        CHF  . Asthma Mother   . Lung cancer Mother        smoked  . Cancer Father        prostate  .  Diabetes Father   . Kidney disease Father        CANCER  . Stroke Daughter   . Diabetes Maternal Grandmother   . Heart disease Maternal Grandfather        HEART ATTACK  . Colon cancer Neg Hx     Social History   Socioeconomic History  . Marital status: Married    Spouse name: Not on file  . Number of children: Not on file  . Years of education: Not on file  . Highest education level: Not on file  Occupational History  . Occupation: Glass blower/designer  Tobacco Use  . Smoking status: Never Smoker  . Smokeless tobacco: Never Used  Substance and Sexual Activity  . Alcohol use: No    Alcohol/week: 0.0 standard drinks  . Drug use: No  . Sexual activity: Yes  Other Topics Concern  . Not on file  Social History Narrative  . Not on file   Social Determinants of Health   Financial Resource Strain:   . Difficulty of Paying Living Expenses:   Food Insecurity:   . Worried About Charity fundraiser in the Last Year:   . Arboriculturist in the Last Year:   Transportation Needs:   . Film/video editor (Medical):   Marland Kitchen Lack of Transportation (Non-Medical):   Physical Activity:   . Days of Exercise per Week:   . Minutes of Exercise per Session:   Stress:   . Feeling of Stress :   Social Connections:   . Frequency of Communication with Friends and Family:   . Frequency of Social Gatherings with Friends and Family:   . Attends Religious  Services:   . Active Member of Clubs or Organizations:   . Attends Archivist Meetings:   Marland Kitchen Marital Status:   Intimate Partner Violence:   . Fear of Current or Ex-Partner:   . Emotionally Abused:   Marland Kitchen Physically Abused:   . Sexually Abused:     Outpatient Medications Prior to Visit  Medication Sig Dispense Refill  . aspirin 81 MG tablet Take 81 mg by mouth daily.    Marland Kitchen atorvastatin (LIPITOR) 40 MG tablet TAKE 1 TABLET BY MOUTH EVERYDAY AT BEDTIME 90 tablet 3  . calcium carbonate (TUMS - DOSED IN MG ELEMENTAL CALCIUM) 500 MG chewable tablet Chew 1-2 tablets by mouth 2 (two) times daily as needed for indigestion or heartburn.    . Coenzyme Q10 (CO Q 10 PO) Take 1 capsule by mouth at bedtime.     . colchicine 0.6 MG tablet TAKE 1 TABLET BY MOUTH EVERYDAY AT BEDTIME 90 tablet 0  . lisinopril (ZESTRIL) 40 MG tablet Take 1 tablet (40 mg total) by mouth daily. 90 tablet 1  . loratadine (CLARITIN) 10 MG tablet Take 10 mg by mouth daily.    . metFORMIN (GLUCOPHAGE) 500 MG tablet Take 1 tablet (500 mg total) by mouth 2 (two) times daily with a meal. 180 tablet 1  . Multiple Vitamin (MULTIVITAMIN) tablet Take 1 tablet by mouth daily. Reported on 12/20/2015    . Omega-3 Fatty Acids (OMEGA-3 FISH OIL) 1200 MG CAPS Take by mouth.    . pantoprazole (PROTONIX) 40 MG tablet TAKE 1 TABLET BY MOUTH DAILY AS NEEDED. 90 tablet 3  . calcium carbonate (OS-CAL) 1250 (500 Ca) MG chewable tablet Chew by mouth.    . Fish Oil-Cholecalciferol (FISH OIL + D3 PO) Take 1 capsule by mouth daily.     Marland Kitchen escitalopram (  LEXAPRO) 5 MG tablet TAKE 1 TABLET BY MOUTH EVERYDAY AT BEDTIME (Patient not taking: Reported on 02/06/2020) 90 tablet 1  . Aspirin-Calcium Carbonate 81-777 MG TABS Take by mouth.    Marland Kitchen tiZANidine (ZANAFLEX) 4 MG tablet Take 1 tablet (4 mg total) by mouth every 6 (six) hours as needed for muscle spasms. 30 tablet 0   No facility-administered medications prior to visit.    No Known  Allergies  ROS Review of Systems Review of Systems  Constitutional: Negative for activity change, appetite change, chills and fever.  HENT: Negative for congestion, nosebleeds, trouble swallowing and voice change.   Respiratory: Negative for cough, shortness of breath and wheezing.   Gastrointestinal: Negative for diarrhea, nausea and vomiting.  Genitourinary: Negative for difficulty urinating, dysuria, flank pain and hematuria.  Musculoskeletal: Negative for back pain, joint swelling and neck pain.  Neurological: Negative for dizziness, speech difficulty, light-headedness and numbness.  See HPI. All other review of systems negative.     Objective:    Physical Exam  BP 130/74   Pulse 60   Temp 98.2 F (36.8 C) (Temporal)   Ht 5\' 7"  (1.702 m)   Wt 170 lb (77.1 kg)   SpO2 98%   BMI 26.63 kg/m  Wt Readings from Last 3 Encounters:  02/06/20 170 lb (77.1 kg)  12/16/19 175 lb (79.4 kg)  08/09/19 174 lb 9.6 oz (79.2 kg)   Physical Exam  Constitutional: Oriented to person, place, and time. Appears well-developed and well-nourished.  HENT:  Head: Normocephalic and atraumatic.  Eyes: Conjunctivae and EOM are normal.  Cardiovascular: Normal rate, regular rhythm, normal heart sounds and intact distal pulses.  No murmur heard. Pulmonary/Chest: Effort normal and breath sounds normal. No stridor. No respiratory distress. Has no wheezes.  Neurological: Is alert and oriented to person, place, and time.  Skin: Skin is warm. Capillary refill takes less than 2 seconds.  Psychiatric: Has a normal mood and affect. Behavior is normal. Judgment and thought content normal.    Health Maintenance Due  Topic Date Due  . OPHTHALMOLOGY EXAM  10/29/2019  . COVID-19 Vaccine (2 - Pfizer 2-dose series) 02/09/2020    There are no preventive care reminders to display for this patient.  Lab Results  Component Value Date   TSH 1.260 01/28/2019   Lab Results  Component Value Date   WBC 6.2  08/09/2019   HGB 14.8 08/09/2019   HCT 42.5 08/09/2019   MCV 83 08/09/2019   PLT 360 08/09/2019   Lab Results  Component Value Date   NA 137 12/16/2019   K 4.4 12/16/2019   CO2 21 12/16/2019   GLUCOSE 135 (H) 12/16/2019   BUN 12 12/16/2019   CREATININE 0.93 12/16/2019   BILITOT 0.8 12/16/2019   ALKPHOS 108 12/16/2019   AST 17 12/16/2019   ALT 36 12/16/2019   PROT 7.4 12/16/2019   ALBUMIN 4.9 12/16/2019   CALCIUM 10.2 12/16/2019   ANIONGAP 7 09/15/2017   GFR 76.92 03/29/2015   Lab Results  Component Value Date   CHOL 148 12/16/2019   Lab Results  Component Value Date   HDL 47 12/16/2019   Lab Results  Component Value Date   LDLCALC 88 12/16/2019   Lab Results  Component Value Date   TRIG 63 12/16/2019   Lab Results  Component Value Date   CHOLHDL 3.1 12/16/2019   Lab Results  Component Value Date   HGBA1C 7.8 (A) 12/16/2019      Assessment & Plan:  Problem List Items Addressed This Visit    None    Visit Diagnoses    Type 2 diabetes mellitus with hyperglycemia, without long-term current use of insulin (HCC)    -  Primary  Increase the metformin to twice a day Pt was not aware of the increase in metformin dose 6.7-> 7.5-> 7.8% increase in trend    Anxiety state     Burnout state  -   Patient with physical manifestations such as increased blood pressure as well as physical stress -   Would advise BH counseling and a referral has been placed     A total of 30 minutes were spent face-to-face with the patient during this encounter and over half of that time was spent on counseling and coordination of care.  No orders of the defined types were placed in this encounter.   Follow-up: No follow-ups on file.    Forrest Moron, MD

## 2020-02-08 ENCOUNTER — Telehealth (INDEPENDENT_AMBULATORY_CARE_PROVIDER_SITE_OTHER): Payer: 59 | Admitting: Licensed Clinical Social Worker

## 2020-02-08 ENCOUNTER — Other Ambulatory Visit: Payer: Self-pay | Admitting: Family Medicine

## 2020-02-08 DIAGNOSIS — F411 Generalized anxiety disorder: Secondary | ICD-10-CM

## 2020-02-08 DIAGNOSIS — E119 Type 2 diabetes mellitus without complications: Secondary | ICD-10-CM

## 2020-02-08 NOTE — Telephone Encounter (Signed)
Left message encouraging contact 

## 2020-02-08 NOTE — Telephone Encounter (Signed)
Requested Prescriptions  Pending Prescriptions Disp Refills  . atorvastatin (LIPITOR) 40 MG tablet [Pharmacy Med Name: ATORVASTATIN 40 MG TABLET] 90 tablet 3    Sig: TAKE 1 TABLET BY MOUTH EVERYDAY AT BEDTIME     Cardiovascular:  Antilipid - Statins Failed - 02/08/2020  3:17 AM      Failed - LDL in normal range and within 360 days    LDL Chol Calc (NIH)  Date Value Ref Range Status  12/16/2019 88 0 - 99 mg/dL Final         Passed - Total Cholesterol in normal range and within 360 days    Cholesterol, Total  Date Value Ref Range Status  12/16/2019 148 100 - 199 mg/dL Final         Passed - HDL in normal range and within 360 days    HDL  Date Value Ref Range Status  12/16/2019 47 >39 mg/dL Final         Passed - Triglycerides in normal range and within 360 days    Triglycerides  Date Value Ref Range Status  12/16/2019 63 0 - 149 mg/dL Final         Passed - Patient is not pregnant      Passed - Valid encounter within last 12 months    Recent Outpatient Visits          2 days ago Type 2 diabetes mellitus with hyperglycemia, without long-term current use of insulin ([place])   Primary Care at [facility], Zoe A, MD   1 month ago Type 2 diabetes mellitus with hyperglycemia, without long-term current use of insulin ([place])   Primary Care at [facility], Arlie Solomons, MD   2 months ago Essential hypertension   Primary [facility] at [facility], [place] A, MD   6 months ago Type 2 diabetes mellitus with hyperglycemia, without long-term current use of insulin [facility])   Primary Care at [facility], Zoe A, MD   8 months ago Rash and nonspecific skin eruption   Primary Care at [facility], [place], MD

## 2020-02-15 ENCOUNTER — Ambulatory Visit: Payer: 59 | Attending: Internal Medicine

## 2020-02-15 DIAGNOSIS — Z23 Encounter for immunization: Secondary | ICD-10-CM

## 2020-02-15 NOTE — Progress Notes (Signed)
   Covid-19 Vaccination Clinic  Name:  [name] [name]    MRN: [id number] DOB: 12-20-1965  02/15/2020  [name] was observed post Covid-19 immunization for 15 minutes without incident. He was provided with Vaccine Information Sheet and instruction to access the V-Safe system.   [name] was instructed to call 911 with any severe reactions post vaccine: Marland Kitchen Difficulty breathing  . Swelling of face and throat  . A fast heartbeat  . A bad rash all over body  . Dizziness and weakness   Immunizations Administered    Name Date Dose VIS Date Route   Pfizer COVID-19 Vaccine 02/15/2020  4:57 PM 0.3 mL 12/07/2018 Intramuscular   Manufacturer: [place]   Lot: [id number]   [id number]: [id number]

## 2020-02-17 ENCOUNTER — Other Ambulatory Visit: Payer: Self-pay | Admitting: Family Medicine

## 2020-02-17 DIAGNOSIS — I10 Essential (primary) hypertension: Secondary | ICD-10-CM

## 2020-03-25 ENCOUNTER — Other Ambulatory Visit: Payer: Self-pay | Admitting: Emergency Medicine

## 2020-03-25 DIAGNOSIS — I319 Disease of pericardium, unspecified: Secondary | ICD-10-CM

## 2020-08-21 ENCOUNTER — Other Ambulatory Visit: Payer: Self-pay

## 2020-08-21 ENCOUNTER — Ambulatory Visit (INDEPENDENT_AMBULATORY_CARE_PROVIDER_SITE_OTHER): Payer: Managed Care, Other (non HMO) | Admitting: Podiatry

## 2020-08-21 ENCOUNTER — Other Ambulatory Visit: Payer: Self-pay | Admitting: Podiatry

## 2020-08-21 ENCOUNTER — Ambulatory Visit (INDEPENDENT_AMBULATORY_CARE_PROVIDER_SITE_OTHER): Payer: Managed Care, Other (non HMO)

## 2020-08-21 DIAGNOSIS — M7731 Calcaneal spur, right foot: Secondary | ICD-10-CM

## 2020-08-21 DIAGNOSIS — E119 Type 2 diabetes mellitus without complications: Secondary | ICD-10-CM

## 2020-08-21 DIAGNOSIS — M7661 Achilles tendinitis, right leg: Secondary | ICD-10-CM

## 2020-08-21 DIAGNOSIS — M79672 Pain in left foot: Secondary | ICD-10-CM

## 2020-08-21 DIAGNOSIS — M79671 Pain in right foot: Secondary | ICD-10-CM

## 2020-08-21 MED ORDER — MELOXICAM 7.5 MG PO TABS
7.5000 mg | ORAL_TABLET | Freq: Every day | ORAL | 0 refills | Status: DC
Start: 2020-08-21 — End: 2020-09-17

## 2020-08-21 NOTE — Addendum Note (Signed)
Addended by: Dorris Singh on: 08/21/2020 11:50 AM   Modules accepted: Orders

## 2020-08-21 NOTE — Progress Notes (Signed)
  Subjective:  Patient ID: [name] [name], male    DOB: 1966-06-04,  MRN: [id number]  Chief Complaint  Patient presents with  . Foot Pain    Right posterior heel pain 2 month duration "burning sensation" pt denies any known injuries.   54 y.o. male presents with the above complaint. History confirmed with patient.   Objective:  Physical Exam: warm, good capillary refill, no trophic changes or ulcerative lesions, normal DP and PT pulses and normal sensory exam. Right Foot: tenderness to palpation posterior calcaneus, no pain with calcaneal squeeze, decreased ankle joint ROM and +Silverskiold test normal exam, no swelling, tenderness, instability; ligaments intact, full range of motion of all ankle/foot joints other than findings noted above.  Radiographs: X-ray of the right foot: no evidence of calcaneal stress fracture, plantar calcaneal spur, posterior calcaneal spur and Haglund deformity noted  Assessment:   1. Tendonitis, Achilles, right   2. Heel spur, right   3. Well controlled type 2 diabetes mellitus ([place])    Plan:  Patient was evaluated and treated and all questions answered.  Achilles Tendonitis -XR reviewed with patient -Educated on stretching and icing of the affected limb. -Night splint dispensed. -Rx for Meloxicam. Advised on risks, benefits, and alternatives of the medication  Return in about 1 month (around 09/20/2020) for Tendonitis, Right.

## 2020-08-21 NOTE — Patient Instructions (Signed)

## 2020-09-13 ENCOUNTER — Other Ambulatory Visit: Payer: Self-pay

## 2020-09-13 ENCOUNTER — Other Ambulatory Visit: Payer: Self-pay | Admitting: Family Medicine

## 2020-09-13 ENCOUNTER — Ambulatory Visit
Admission: RE | Admit: 2020-09-13 | Discharge: 2020-09-13 | Disposition: A | Discharge status: Home / Self Care | Payer: 59 | Source: Ambulatory Visit | Attending: Family Medicine | Admitting: Family Medicine

## 2020-09-13 DIAGNOSIS — M5431 Sciatica, right side: Secondary | ICD-10-CM

## 2020-09-13 DIAGNOSIS — M5432 Sciatica, left side: Secondary | ICD-10-CM

## 2020-09-15 ENCOUNTER — Other Ambulatory Visit: Payer: Self-pay | Admitting: Podiatry

## 2020-09-17 NOTE — Telephone Encounter (Signed)
Please advise 

## 2020-09-21 ENCOUNTER — Ambulatory Visit (INDEPENDENT_AMBULATORY_CARE_PROVIDER_SITE_OTHER): Payer: 59 | Admitting: Podiatry

## 2020-09-21 ENCOUNTER — Other Ambulatory Visit: Payer: Self-pay

## 2020-09-21 DIAGNOSIS — M7661 Achilles tendinitis, right leg: Secondary | ICD-10-CM | POA: Diagnosis not present

## 2020-09-21 DIAGNOSIS — M7731 Calcaneal spur, right foot: Secondary | ICD-10-CM

## 2020-09-21 DIAGNOSIS — E119 Type 2 diabetes mellitus without complications: Secondary | ICD-10-CM | POA: Diagnosis not present

## 2020-09-21 MED ORDER — METHYLPREDNISOLONE 4 MG PO TBPK: ORAL_TABLET | ORAL | 0 refills | Status: DC

## 2020-09-21 NOTE — Progress Notes (Signed)
  Subjective:  Patient ID: [name] [name], male    DOB: Dec 08, 1965,  MRN: [id number]  Chief Complaint  Patient presents with  . Tendonitis    Pt states still experiencing burning pain, meloxicam is not helpful and nightsplint only helps in the morning.   54 y.o. male presents with the above complaint. History confirmed with patient.   Objective:  Physical Exam: warm, good capillary refill, no trophic changes or ulcerative lesions, normal DP and PT pulses and normal sensory exam. Right Foot: tenderness to palpation posterior calcaneus, no pain with calcaneal squeeze, decreased ankle joint ROM and +Silverskiold test normal exam, no swelling, tenderness, instability; ligaments intact, full range of motion of all ankle/foot joints other than findings noted above.  Assessment:   1. Tendonitis, Achilles, right   2. Heel spur, right   3. Well controlled type 2 diabetes mellitus ([place])    Plan:  Patient was evaluated and treated and all questions answered.  Achilles Tendonitis -Rx Medorl pack, d/c meloxicam -Continue stretching exercises -I think this should resolve well if he can continue stretching regimen.  Return in about 1 month (around 10/22/2020) for Tendonitis.

## 2020-10-02 ENCOUNTER — Other Ambulatory Visit: Payer: Self-pay

## 2020-10-02 ENCOUNTER — Ambulatory Visit: Payer: 59 | Attending: Family Medicine

## 2020-10-02 DIAGNOSIS — M47816 Spondylosis without myelopathy or radiculopathy, lumbar region: Secondary | ICD-10-CM | POA: Diagnosis present

## 2020-10-02 DIAGNOSIS — M62830 Muscle spasm of back: Secondary | ICD-10-CM | POA: Diagnosis not present

## 2020-10-02 DIAGNOSIS — R293 Abnormal posture: Secondary | ICD-10-CM | POA: Diagnosis present

## 2020-10-02 DIAGNOSIS — M7918 Myalgia, other site: Secondary | ICD-10-CM | POA: Diagnosis present

## 2020-10-02 DIAGNOSIS — M62838 Other muscle spasm: Secondary | ICD-10-CM

## 2020-10-02 NOTE — Therapy (Signed)
[place], [place], [address] Phone: [phone]   Fax:  [phone]  Physical Therapy Evaluation  Patient Details  Name: [name] [name] MRN: [id number] Date of Birth: 07-May-1966 Referring Provider (PT): Delia Chimes MD   Encounter Date: 10/02/2020   PT End of Session - 10/02/20 1436    Visit Number 1    Number of Visits 12    Date for PT Re-Evaluation 11/09/20    Authorization Type [employer]    PT Start Time 0245    PT Stop Time 0330    PT Time Calculation (min) 45 min    Activity Tolerance Patient tolerated treatment well    Behavior During Therapy [facility] for tasks assessed/performed           Past Medical History:  Diagnosis Date   Allergy    Arthritis    Asthma    Heart murmur    Hypertension    Kidney stones    Lyme disease    Pericarditis    Pneumonia     Past Surgical History:  Procedure Laterality Date   HERNIA REPAIR      There were no vitals filed for this visit.    Subjective Assessment - 10/02/20 1447    Subjective He reports Lt buttock pain nad posterior thighs.  Varies from RT to LT. Today on LT.   Md said it was sciatica.   Chiropractic treatment without benefit x 3 sessions.  Prednisone was helpful.    Limitations Sitting   Does everything pain on sitting fro long drives with sitting   How long can you sit comfortably? from minutes depend on day    How long can you stand comfortably? As needed    How long can you walk comfortably? as needed    Diagnostic tests xrays:  degenerative changes    Patient Stated Goals To have less pain with sitting, no numbness in buttock and thighs    Currently in Pain? Yes    Pain Score 2     Pain Location Buttocks   posterior thigh   Pain Orientation Left;Posterior    Pain Descriptors / Indicators Sore    Pain Type Chronic pain    Pain Radiating Towards posterior thighs    Pain Onset More than a month ago    Pain Frequency  Intermittent    Aggravating Factors  sitting    Pain Relieving Factors change positions              [facility] PT Assessment - 10/02/20 0001      Assessment   Medical Diagnosis LBP    Referring Provider (PT) Delia Chimes MD    Onset Date/Surgical Date --   3 + months, 3 months more constant   Next MD Visit no    Prior Therapy no      Precautions   Precautions None      Restrictions   Weight Bearing Restrictions No      Balance Screen   Has the patient fallen in the past 6 months No      Prior Function   Level of Independence Independent      Cognition   Overall Cognitive Status Within Functional Limits for tasks assessed      Observation/Other Assessments   Focus on Therapeutic Outcomes (FOTO)  57%  possible improvement to 69%      ROM / Strength   AROM / PROM / Strength AROM;PROM;Strength  AROM   AROM Assessment Site Lumbar    Lumbar Flexion 50    Lumbar Extension 20    Lumbar - Right Side Bend 25    Lumbar - Left Side Bend 25    Lumbar - Right Rotation WNL    Lumbar - Left Rotation WNL      PROM   Overall PROM Comments hip WNL   supine , prone IR 10 degrees bilaterally  and LT hip extension at neutral      Flexibility   Soft Tissue Assessment /Muscle Length yes    Hamstrings 70 RT and Lt    ITB + Obers LT      Palpation   SI assessment  in supine RT shorter than Lt      Ambulation/Gait   Gait Comments normal                      Objective measurements completed on examination: See above findings.               PT Education - 10/02/20 1528    Education Details POC    HEp    FOTO score reviewed and possible progress    Person(s) Educated Patient    Methods Explanation;Tactile cues;Verbal cues;Handout    Comprehension Verbalized understanding;Returned demonstration            PT Short Term Goals - 10/02/20 1438      PT SHORT TERM GOAL #1   Title He will be independent with initial HEP    Time 3    Period Weeks     Status New      PT SHORT TERM GOAL #2   Title He will report back / hip pain decr  10-20%  with sitting    Time 3    Period Weeks    Status New      PT SHORT TERM GOAL #3   Title FOTO score improved to  63 %    Time 3    Period Weeks    Status New             PT Long Term Goals - 10/02/20 1439      PT LONG TERM GOAL #1   Title He will be indpendent with all hEp issued    Time 6    Period Weeks    Status New      PT LONG TERM GOAL #2   Title FOTO score will improve to  69%    Time 6    Period Weeks    Status New      PT LONG TERM GOAL #3   Title He will report back pain  improved and able to sit at work for 2 hours with 2/10 or <   pain    Time 6    Period Weeks    Status New                  Plan - 10/02/20 1437    Clinical Impression Statement [name] [name] presents with buttock pain that varies from side to side and pain into posterior thighs and numbness  mostly with sitting for prolonged periods.   Today he indicated the area of pain over LT SI in [place]'s area. He has some asymetry of leg length and pelvis. He was not tender in lower back but was tender in posterior and lateral gluteals , Not tender at trochantor.   Spine was stiff and  hip and quads were tight.  He may have more SI / piriformis issues but it is unclear.     He should improve with skilled PT and consistent HEP by pt.    Examination-Activity Limitations Squat;Carry;Sit    Examination-Participation Restrictions Community Activity;Occupation   driving   Stability/Clinical Decision Making Stable/Uncomplicated    Clinical Decision Making Low    Rehab Potential Good    PT Frequency 2x / week    PT Duration 6 weeks    PT Treatment/Interventions Electrical Stimulation;Iontophoresis 4mg /ml Dexamethasone;Moist Heat;Therapeutic activities;Therapeutic exercise;Manual techniques;Patient/family education;Passive range of motion;Dry needling;Taping    PT Next Visit Plan review HEp and progress for quad  and anterior hip tightness stretching.   Manual as needed    PT Home Exercise Plan side lye hip abduction and adduction,    stretch piriformis and  LTR    Consulted and Agree with Plan of Care Patient           Patient will benefit from skilled therapeutic intervention in order to improve the following deficits and impairments:  Pain,Postural dysfunction,Decreased activity tolerance,Decreased range of motion,Increased muscle spasms,Impaired flexibility  Visit Diagnosis: Spondylosis of lumbar spine  Abnormal posture  Spasm of left piriformis muscle  Pain in left buttock     Problem List Patient Active Problem List   Diagnosis Date Noted   Chronic pericarditis 11/28/2017   Hypertension 11/28/2017   Well controlled type 2 diabetes mellitus ([place]) 03/19/2017    [name] [name]   PT 10/02/2020, 4:10 PM  [place] [facility] [address] [place], [place], [address] Phone: [phone]   Fax:  [phone]  Name: [name] [name] MRN: [id number] Date of Birth: 1966/04/03

## 2020-10-02 NOTE — Patient Instructions (Signed)
Piriformis and LTR stretch 2x/day 3 reps 30 sec hold RT/LT  Hip abduct and adduct in side lye 3x10 2x/day  RT and LT

## 2020-10-19 ENCOUNTER — Other Ambulatory Visit: Payer: Self-pay

## 2020-10-19 ENCOUNTER — Ambulatory Visit: Payer: 59 | Attending: Family Medicine

## 2020-10-19 DIAGNOSIS — M7918 Myalgia, other site: Secondary | ICD-10-CM | POA: Diagnosis present

## 2020-10-19 DIAGNOSIS — M62838 Other muscle spasm: Secondary | ICD-10-CM

## 2020-10-19 DIAGNOSIS — R293 Abnormal posture: Secondary | ICD-10-CM | POA: Diagnosis present

## 2020-10-19 DIAGNOSIS — M47816 Spondylosis without myelopathy or radiculopathy, lumbar region: Secondary | ICD-10-CM | POA: Diagnosis not present

## 2020-10-19 NOTE — Therapy (Addendum)
[place], [place], [address] Phone: [phone]   Fax:  [phone]  Physical Therapy Treatment/Discharge  Patient Details  Name: [name] [name] MRN: [id number] Date of Birth: 1966-06-30 Referring Provider (PT): Delia Chimes MD   Encounter Date: 10/19/2020   PT End of Session - 10/19/20 1628    Visit Number 2    Number of Visits 12    Date for PT Re-Evaluation 11/09/20    Authorization Type [employer]    PT Start Time 1350    PT Stop Time 1435    PT Time Calculation (min) 45 min    Activity Tolerance Patient tolerated treatment well    Behavior During Therapy [facility] for tasks assessed/performed           Past Medical History:  Diagnosis Date  . Allergy   . Arthritis   . Asthma   . Heart murmur   . Hypertension   . Kidney stones   . Lyme disease   . Pericarditis   . Pneumonia     Past Surgical History:  Procedure Laterality Date  . HERNIA REPAIR      There were no vitals filed for this visit.   Subjective Assessment - 10/19/20 1622    Subjective Pt reports the pain and numbness he is experiencing in the bilat buttock region has improved by 75%. Currently, it is an intermittent tingling which is cause by prolonged sitting or on hard/firm surfaces.    Limitations Sitting    Patient Stated Goals To have less pain with sitting, no numbness in buttock and thighs                             [facility] Adult PT Treatment/Exercise - 10/19/20 0001      Exercises   Exercises Lumbar;Knee/Hip      Lumbar Exercises: Stretches   Active Hamstring Stretch Right;Left;20 seconds;2 reps    Lower Trunk Rotation 5 reps   5 sec   Piriformis Stretch Right;Left;2 reps;20 seconds    Piriformis Stretch Limitations also c double KTC    Figure 4 Stretch 2 reps;20 seconds    Figure 4 Stretch Limitations Hip ER      Lumbar Exercises: Supine   Pelvic Tilt 15 reps   3 sec   Pelvic Tilt Limitations with  hand reach toward ankles      Lumbar Exercises: Sidelying   Hip Abduction Right;Left;10 reps    Other Sidelying Lumbar Exercises R and L hip add 10 reps      Lumbar Exercises: Quadruped   Opposite Arm/Leg Raise Right arm/Left leg;Left arm/Right leg;10 reps;3 seconds    Opposite Arm/Leg Raise Limitations ab engagement   also back kicks alone                 PT Education - 10/19/20 1627    Education Details HEP. Use of soft foam roller for gluteal massage. Recommendation for proper seat cushion.    Person(s) Educated Patient    Methods Explanation;Demonstration;Tactile cues;Verbal cues;Handout    Comprehension Verbalized understanding;Returned demonstration;Verbal cues required;Tactile cues required;Need further instruction            PT Short Term Goals - 10/02/20 1438      PT SHORT TERM GOAL #1   Title He will be independent with initial HEP    Time 3    Period Weeks    Status New  PT SHORT TERM GOAL #2   Title He will report back / hip pain decr  10-20%  with sitting    Time 3    Period Weeks    Status New      PT SHORT TERM GOAL #3   Title FOTO score improved to  63 %    Time 3    Period Weeks    Status New             PT Long Term Goals - 10/02/20 1439      PT LONG TERM GOAL #1   Title He will be indpendent with all hEp issued    Time 6    Period Weeks    Status New      PT LONG TERM GOAL #2   Title FOTO score will improve to  69%    Time 6    Period Weeks    Status New      PT LONG TERM GOAL #3   Title He will report back pain  improved and able to sit at work for 2 hours with 2/10 or <   pain    Time 6    Period Weeks    Status New                 Plan - 10/19/20 1629    Clinical Impression Statement Pt is responding well to the initial HEP with a good reduction of symptoms. PT today was completed for instruction in and completion of additional lumopelvic and hip flexibility and strengthening exs.. Pt also tried a soft  roller for gluteal massage and recommended proper seat cushioning for comfort and symptom relied. Pt tolerated today's P session without adverse effects.    Examination-Activity Limitations Squat;Carry;Sit    Examination-Participation Restrictions Community Activity;Occupation    Stability/Clinical Decision Making Stable/Uncomplicated    Clinical Decision Making Low    Rehab Potential Good    PT Frequency 2x / week    PT Duration 6 weeks    PT Treatment/Interventions Electrical Stimulation;Iontophoresis 52m/ml Dexamethasone;Moist Heat;Therapeutic activities;Therapeutic exercise;Manual techniques;Patient/family education;Passive range of motion;Dry needling;Taping    PT Next Visit Plan Assess response to HEP. Progress for quad and anterior hip tightness stretching.   Manual as needed    PT Home Exercise Plan [id number]. Sidelying hip abduction and adduction, stretch piriformis and  LTR    Consulted and Agree with Plan of Care Patient           Patient will benefit from skilled therapeutic intervention in order to improve the following deficits and impairments:  Pain,Postural dysfunction,Decreased activity tolerance,Decreased range of motion,Increased muscle spasms,Impaired flexibility  Visit Diagnosis: Spondylosis of lumbar spine  Abnormal posture  Spasm of left piriformis muscle  Pain in left buttock     Problem List Patient Active Problem List   Diagnosis Date Noted  . Chronic pericarditis 11/28/2017  . Hypertension 11/28/2017  . Well controlled type 2 diabetes mellitus ([place] 03/19/2017    AGar PontoMS, PT 10/19/20 4:50 PM  [facility] [address]Phone: [phone]  Fax:  [phone] Name: [name] BetzoldMRN: [id number] of Birth: [birth year]  PHYSICAL THERAPY DISCHARGE SUMMARY  Visits from Start of Care: 2  Current functional level related to goals / functional outcomes: See  above   Remaining deficits: Unknown, pt stopped attending his PT sessions   Education / Equipment: HEP Plan: Patient agrees to discharge.  Patient  goals were not met. Patient is being discharged due to not returning since the last visit.  ?????        Sahiba Granholm MS, PT 12/25/20 3:28 PM

## 2020-10-23 ENCOUNTER — Ambulatory Visit: Payer: 59 | Admitting: Podiatry

## 2020-10-24 ENCOUNTER — Ambulatory Visit: Payer: 59

## 2020-11-02 ENCOUNTER — Ambulatory Visit: Payer: 59

## 2020-12-28 ENCOUNTER — Encounter: Payer: Self-pay | Admitting: Internal Medicine

## 2021-01-30 ENCOUNTER — Encounter: Payer: Self-pay | Admitting: Dermatology

## 2021-01-30 ENCOUNTER — Other Ambulatory Visit: Payer: Self-pay

## 2021-01-30 ENCOUNTER — Ambulatory Visit: Payer: 59 | Admitting: Dermatology

## 2021-01-30 DIAGNOSIS — D2239 Melanocytic nevi of other parts of face: Secondary | ICD-10-CM | POA: Diagnosis not present

## 2021-01-30 DIAGNOSIS — D485 Neoplasm of uncertain behavior of skin: Secondary | ICD-10-CM

## 2021-01-30 DIAGNOSIS — D210 Benign neoplasm of connective and other soft tissue of head, face and neck: Secondary | ICD-10-CM | POA: Diagnosis not present

## 2021-01-30 DIAGNOSIS — Z1283 Encounter for screening for malignant neoplasm of skin: Secondary | ICD-10-CM

## 2021-01-30 NOTE — Patient Instructions (Signed)

## 2021-02-09 ENCOUNTER — Encounter: Payer: Self-pay | Admitting: Dermatology

## 2021-02-10 NOTE — Progress Notes (Signed)
   Follow-Up Visit   Subjective  [name] [name] is a 55 y.o. male who presents for the following: Annual Exam (Here for full body skin examination-recheck right side of nose x several weeks- + bleed).  Annual exam, possible new spots nose Location:  Duration:  Quality:  Associated Signs/Symptoms: Modifying Factors:  Severity:  Timing: Context:   Objective  Well appearing patient in no apparent distress; mood and affect are within normal limits. Objective  Left Inguinal Area: General skin examination.  No atypical pigmented lesions.  To facial lesions possible nonmelanoma skin cancers, biopsies will be obtained.  Objective  Right Nasal Sidewall: Pearly 14mm papule without erosion, BCC versus angiofibroma       Objective  Right Nasofacial Sulcus inf: Pearly pink 29mm domed papule, same differential versus dermal nevus.       A full examination was performed including scalp, head, eyes, ears, nose, lips, neck, chest, axillae, abdomen, back, buttocks, bilateral upper extremities, bilateral lower extremities, hands, feet, fingers, toes, fingernails, and toenails. All findings within normal limits unless otherwise noted below.   Assessment & Plan    Screening exam for skin cancer Left Inguinal Area  Annual skin examination.  Encouraged to self examine twice annually.  Continued ultraviolet protection.  Neoplasm of uncertain behavior of skin (2) Right Nasal Sidewall  Skin / nail biopsy Type of biopsy: tangential   Informed consent: discussed and consent obtained   Timeout: patient name, date of birth, surgical site, and procedure verified   Procedure prep:  Patient was prepped and draped in usual sterile fashion (Non sterile) Prep type:  Chlorhexidine Anesthesia: the lesion was anesthetized in a standard fashion   Anesthetic:  1% lidocaine w/ epinephrine 1-100,000 local infiltration Instrument used: flexible razor blade   Outcome: patient tolerated procedure well    Post-procedure details: wound care instructions given    Specimen 1 - Surgical pathology Differential Diagnosis: BCC SCC  Check Margins: No  Right Nasofacial Sulcus inf  Skin / nail biopsy Type of biopsy: tangential   Informed consent: discussed and consent obtained   Timeout: patient name, date of birth, surgical site, and procedure verified   Procedure prep:  Patient was prepped and draped in usual sterile fashion (Non sterile) Prep type:  Chlorhexidine Anesthesia: the lesion was anesthetized in a standard fashion   Anesthetic:  1% lidocaine w/ epinephrine 1-100,000 local infiltration Instrument used: flexible razor blade   Outcome: patient tolerated procedure well   Post-procedure details: wound care instructions given    Specimen 2 - Surgical pathology Differential Diagnosis: tag  Check Margins: No      I, Lavonna Monarch, MD, have reviewed all documentation for this visit.  The documentation on 02/10/21 for the exam, diagnosis, procedures, and orders are all accurate and complete.

## 2021-04-30 ENCOUNTER — Other Ambulatory Visit: Payer: Self-pay

## 2021-04-30 ENCOUNTER — Ambulatory Visit (INDEPENDENT_AMBULATORY_CARE_PROVIDER_SITE_OTHER): Payer: 59 | Admitting: Podiatry

## 2021-04-30 DIAGNOSIS — M722 Plantar fascial fibromatosis: Secondary | ICD-10-CM

## 2021-04-30 MED ORDER — BETAMETHASONE SOD PHOS & ACET 6 (3-3) MG/ML IJ SUSP
6.0000 mg | Freq: Once | INTRAMUSCULAR | Status: AC
  Administered 2021-04-30: 6 mg

## 2021-04-30 NOTE — Progress Notes (Signed)
  Subjective:  Patient ID: [name] [name], male    DOB: 24-Nov-1965,  MRN: [id number]  CC: Right heel pain, states pain more on the bottom than in the back. Burning type pain, worse at night and after 12 hours on his feet. Has tried stretching without relief.  55 y.o. male presents with the above complaint. History confirmed with patient.   Objective:  Physical Exam: warm, good capillary refill, no trophic changes or ulcerative lesions, normal DP and PT pulses and normal sensory exam. Right Foot: tenderness to palpation medial calcaneal tuber, no pain with calcaneal squeeze, decreased ankle joint ROM, and +Silverskiold test normal exam, no swelling, tenderness, instability; ligaments intact, full range of motion of all ankle/foot joints other than findings noted above.  Assessment:   1. Plantar fasciitis    Plan:  Patient was evaluated and treated and all questions answered.   Plantar Fasciitis -XR reviewed with patient -Educated patient on stretching and icing of the affected limb -Plantar fascial brace dispensed -Injection delivered to the plantar fascia of the right foot.  Procedure: Injection Tendon/Ligament Consent: Verbal consent obtained. Location: Right plantar fascia at the glabrous junction; medial approach. Skin Prep: Alcohol. Injectate: 1 cc 0.5% marcaine plain, 1 cc betamethasone acetate-betamethasone sodium phosphate Disposition: Patient tolerated procedure well. Injection site dressed with a band-aid.   No follow-ups on file.

## 2021-05-31 ENCOUNTER — Ambulatory Visit: Payer: 59 | Admitting: Podiatry

## 2021-05-31 ENCOUNTER — Other Ambulatory Visit: Payer: Self-pay

## 2021-05-31 DIAGNOSIS — M722 Plantar fascial fibromatosis: Secondary | ICD-10-CM

## 2021-05-31 MED ORDER — BETAMETHASONE SOD PHOS & ACET 6 (3-3) MG/ML IJ SUSP
6.0000 mg | Freq: Once | INTRAMUSCULAR | Status: AC
  Administered 2021-05-31: 6 mg

## 2021-05-31 NOTE — Progress Notes (Signed)
  Subjective:  Patient ID: [name] [name], male    DOB: 04/26/1966,  MRN: [id number]  Chief Complaint  Patient presents with   Plantar Fasciitis    Pt states still experiencing pain, injection effective for 1 week. Pt states he does not find PF Brace to be helpful.    55 y.o. male presents with the above complaint. History confirmed with patient.   Objective:  Physical Exam: warm, good capillary refill, no trophic changes or ulcerative lesions, normal DP and PT pulses and normal sensory exam. Right Foot: tenderness to palpation medial calcaneal tuber, no pain with calcaneal squeeze, decreased ankle joint ROM, and +Silverskiold test normal exam, no swelling, tenderness, instability; ligaments intact, full range of motion of all ankle/foot joints other than findings noted above.  Assessment:   1. Plantar fasciitis    Plan:  Patient was evaluated and treated and all questions answered.   Plantar Fasciitis -Does appear less tender today.  -As injection did help for 1 week, repeat injection as below -Continue stretching and icing.  Procedure: Injection Tendon/Ligament Consent: Verbal consent obtained. Location: Right plantar fascia at the glabrous junction; medial approach. Skin Prep: Alcohol. Injectate: 1 cc 0.5% marcaine plain, 1 cc betamethasone acetate-betamethasone sodium phosphate Disposition: Patient tolerated procedure well. Injection site dressed with a band-aid.    No follow-ups on file.

## 2021-08-01 ENCOUNTER — Ambulatory Visit: Payer: 59 | Admitting: Family Medicine

## 2021-08-01 ENCOUNTER — Encounter: Payer: Self-pay | Admitting: Family Medicine

## 2021-08-01 ENCOUNTER — Other Ambulatory Visit: Payer: Self-pay

## 2021-08-01 VITALS — BP 128/70 | HR 71 | Temp 97.9°F | Ht 67.0 in | Wt 169.2 lb

## 2021-08-01 DIAGNOSIS — I10 Essential (primary) hypertension: Secondary | ICD-10-CM | POA: Diagnosis not present

## 2021-08-01 DIAGNOSIS — E7800 Pure hypercholesterolemia, unspecified: Secondary | ICD-10-CM

## 2021-08-01 DIAGNOSIS — E119 Type 2 diabetes mellitus without complications: Secondary | ICD-10-CM | POA: Diagnosis not present

## 2021-08-01 DIAGNOSIS — I319 Disease of pericardium, unspecified: Secondary | ICD-10-CM

## 2021-08-01 NOTE — Progress Notes (Addendum)
New Patient Office Visit  Subjective:  Patient ID: [name] [name], male    DOB: Jan 23, 1966  Age: 55 y.o. MRN: [id number]  CC:  Chief Complaint  Patient presents with   Establish Care    NP/establish care, would like to discuss Diabetes and medication.     HPI [name] [name] presents for establishment of care follow-up for his history of type 2 diabetes, hypertension and elevated cholesterol.  Has been lost to follow-up since March 2021.  Metformin had been increased to 1000 mg twice daily when he was last seen.  As far as he knows his diabetes is well controlled.  Has dilated eye exams every year in May.  Last colonoscopy was 6 years ago.  He is now due for a second 1.  He is active on his job but works rotating shifts.  He has been going to the gym pends till he started watching his granddaughter in the afternoons.  Denies depression but does feel anxiety from time to time.  His wife has sometimes said that he is irritable.  Past Medical History:  Diagnosis Date   Allergy    Arthritis    Asthma    Heart murmur    Hypertension    Kidney stones    Lyme disease    Pericarditis    Pneumonia     Past Surgical History:  Procedure Laterality Date   HERNIA REPAIR      Family History  Problem Relation Age of Onset   Diabetes Mother    Cancer Mother        CHF   Heart disease Mother        CHF   Asthma Mother    Lung cancer Mother        smoked   Cancer Father        prostate   Diabetes Father    Kidney disease Father        CANCER   Stroke Daughter    Diabetes Maternal Grandmother    Heart disease Maternal Grandfather        HEART ATTACK   Colon cancer Neg Hx     Social History   Socioeconomic History   Marital status: Married    Spouse name: Not on file   Number of children: Not on file   Years of education: Not on file   Highest education level: Not on file  Occupational History   Occupation: Glass blower/designer  Tobacco Use   Smoking status: Never   Smokeless  tobacco: Never  Vaping Use   Vaping Use: Never used  Substance and Sexual Activity   Alcohol use: No    Alcohol/week: 0.0 standard drinks   Drug use: No   Sexual activity: Yes  Other Topics Concern   Not on file  Social History Narrative   Not on file   Social Determinants of Health   Financial Resource Strain: Not on file  Food Insecurity: Not on file  Transportation Needs: Not on file  Physical Activity: Not on file  Stress: Not on file  Social Connections: Not on file  Intimate Partner Violence: Not on file    ROS Review of Systems  Constitutional: Negative.   HENT: Negative.    Eyes:  Negative for photophobia and visual disturbance.  Respiratory: Negative.    Cardiovascular: Negative.   Gastrointestinal: Negative.   Endocrine: Negative for polyphagia and polyuria.  Genitourinary:  Negative for difficulty urinating, frequency and urgency.  Musculoskeletal:  Negative for back  pain and gait problem.  Neurological:  Negative for speech difficulty, weakness and numbness.  Psychiatric/Behavioral:  Negative for dysphoric mood. The patient is nervous/anxious.   Depression screen [facility] 2/9 08/01/2021 08/01/2021 02/06/2020  Decreased Interest 0 0 0  Down, Depressed, Hopeless 0 0 0  PHQ - 2 Score 0 0 0  Altered sleeping 0 - -  Tired, decreased energy 1 - -  Change in appetite 0 - -  Feeling bad or failure about yourself  0 - -  Trouble concentrating 0 - -  Moving slowly or fidgety/restless 0 - -  Suicidal thoughts 0 - -  PHQ-9 Score 1 - -  Difficult doing work/chores Not difficult at all - -     Objective:   Today's Vitals: BP 128/70 (BP Location: Right Arm, Patient Position: Sitting, Cuff Size: Normal)   Pulse 71   Temp 97.9 F (36.6 C) (Temporal)   Ht 5\' 7"  (1.702 m)   Wt 169 lb 3.2 oz (76.7 kg)   SpO2 97%   BMI 26.50 kg/m   Physical Exam Vitals and nursing note reviewed.  Constitutional:      General: He is not in acute distress.    Appearance: Normal  appearance. He is not ill-appearing, toxic-appearing or diaphoretic.  HENT:     Head: Normocephalic and atraumatic.     Right Ear: External ear normal.     Left Ear: External ear normal.     Mouth/Throat:     Mouth: Mucous membranes are moist.     Pharynx: Oropharynx is clear. No oropharyngeal exudate or posterior oropharyngeal erythema.  Eyes:     General: No scleral icterus.       Right eye: No discharge.        Left eye: No discharge.     Extraocular Movements: Extraocular movements intact.     Conjunctiva/sclera: Conjunctivae normal.     Pupils: Pupils are equal, round, and reactive to light.  Neck:     Vascular: No carotid bruit.  Cardiovascular:     Rate and Rhythm: Normal rate and regular rhythm.     Pulses:          Dorsalis pedis pulses are 2+ on the right side and 2+ on the left side.       Posterior tibial pulses are 2+ on the right side and 2+ on the left side.  Pulmonary:     Effort: Pulmonary effort is normal.     Breath sounds: Normal breath sounds.  Musculoskeletal:     Cervical back: No rigidity or tenderness.     Right lower leg: No edema.     Left lower leg: No edema.  Lymphadenopathy:     Cervical: No cervical adenopathy.  Skin:    General: Skin is warm and dry.  Neurological:     Mental Status: He is alert and oriented to person, place, and time.  Psychiatric:        Mood and Affect: Mood normal.        Behavior: Behavior normal.   Diabetic Foot Exam - Simple   Simple Foot Form Visual Inspection See comments: Yes Sensation Testing Intact to touch and monofilament testing bilaterally: Yes Pulse Check Posterior Tibialis and Dorsalis pulse intact bilaterally: Yes Comments Feet are cavus bilaterally.     Assessment & Plan:   Problem List Items Addressed This Visit       Endocrine   Well controlled type 2 diabetes mellitus ([place])   Relevant Orders  Comprehensive metabolic panel   Hemoglobin A1c   Urinalysis, Routine w reflex microscopic    Microalbumin / creatinine urine ratio   Other Visit Diagnoses     Essential hypertension    -  Primary   Relevant Orders   CBC   Comprehensive metabolic panel   Urinalysis, Routine w reflex microscopic   Microalbumin / creatinine urine ratio   Chronic pericarditis, non-rheumatic       Relevant Orders   Ambulatory referral to Cardiology   Elevated cholesterol       Relevant Orders   Comprehensive metabolic panel   LDL cholesterol, direct   Lipid panel       Outpatient Encounter Medications as of 08/01/2021  Medication Sig   amLODipine (NORVASC) 5 MG tablet Take 5 mg by mouth daily.   aspirin 81 MG tablet Take 81 mg by mouth daily.   atorvastatin (LIPITOR) 40 MG tablet TAKE 1 TABLET BY MOUTH EVERYDAY AT BEDTIME   calcium carbonate (TUMS - DOSED IN MG ELEMENTAL CALCIUM) 500 MG chewable tablet Chew 1-2 tablets by mouth 2 (two) times daily as needed for indigestion or heartburn.   Coenzyme Q10 (CO Q 10 PO) Take 1 capsule by mouth at bedtime.    colchicine 0.6 MG tablet TAKE 1 TABLET BY MOUTH EVERYDAY AT BEDTIME   lisinopril (ZESTRIL) 40 MG tablet Take 1 tablet (40 mg total) by mouth daily.   metFORMIN (GLUCOPHAGE) 1000 MG tablet Take 1,000 mg by mouth 2 (two) times daily.   Multiple Vitamin (MULTIVITAMIN) tablet Take 1 tablet by mouth daily. Reported on 12/20/2015   Omega-3 Fatty Acids (OMEGA-3 FISH OIL) 1200 MG CAPS Take by mouth.   pantoprazole (PROTONIX) 40 MG tablet TAKE 1 TABLET BY MOUTH DAILY AS NEEDED.   [DISCONTINUED] lisinopril (ZESTRIL) 40 MG tablet Take 1 tablet by mouth daily.   [DISCONTINUED] loratadine (CLARITIN) 10 MG tablet Take 10 mg by mouth daily.   [DISCONTINUED] benzonatate (TESSALON) 100 MG capsule Take 100 mg by mouth 3 (three) times daily as needed.   [DISCONTINUED] doxycycline (VIBRA-TABS) 100 MG tablet Take 100 mg by mouth 2 (two) times daily.   [DISCONTINUED] LAGEVRIO 200 MG CAPS SMARTSIG:4 Each By Mouth Twice Daily   [DISCONTINUED] meloxicam (MOBIC) 7.5 MG  tablet TAKE 1 TABLET BY MOUTH EVERY DAY   [DISCONTINUED] metFORMIN (GLUCOPHAGE) 500 MG tablet Take 1 tablet (500 mg total) by mouth 2 (two) times daily with a meal.   [DISCONTINUED] methylPREDNISolone (MEDROL DOSEPAK) 4 MG TBPK tablet 6 Day Taper Pack. Take as Directed.   No facility-administered encounter medications on file as of 08/01/2021.    Follow-up: Return in about 3 months (around 11/01/2021).  Will return fasting for above ordered blood work.  He is interested in decreasing meds if possible including colchicine taken for pericarditis.  Continue current medications for now.  Not interested in treating anxiety at this time.  Suggested increased exercise.  Advised bi-valent COVID-vaccine.  Libby Maw, MD

## 2021-08-05 ENCOUNTER — Other Ambulatory Visit (INDEPENDENT_AMBULATORY_CARE_PROVIDER_SITE_OTHER): Payer: 59

## 2021-08-05 ENCOUNTER — Other Ambulatory Visit: Payer: Self-pay

## 2021-08-05 DIAGNOSIS — E119 Type 2 diabetes mellitus without complications: Secondary | ICD-10-CM | POA: Diagnosis not present

## 2021-08-05 DIAGNOSIS — E7800 Pure hypercholesterolemia, unspecified: Secondary | ICD-10-CM

## 2021-08-05 DIAGNOSIS — I10 Essential (primary) hypertension: Secondary | ICD-10-CM

## 2021-08-05 LAB — LIPID PANEL
Cholesterol: 151 mg/dL (ref 0–200)
HDL: 46 mg/dL (ref >39.00)
LDL Cholesterol: 79 mg/dL (ref 0–99)
NonHDL: 104.76
Total CHOL/HDL Ratio: 3
Triglycerides: 127 mg/dL (ref 0.0–149.0)
VLDL: 25.4 mg/dL (ref 0.0–40.0)

## 2021-08-05 LAB — CBC
HCT: 41.5 % (ref 39.0–52.0)
Hemoglobin: 13.9 g/dL (ref 13.0–17.0)
MCHC: 33.5 g/dL (ref 30.0–36.0)
MCV: 84.4 fl (ref 78.0–100.0)
Platelets: 408 10*3/uL — ABNORMAL HIGH (ref 150.0–400.0)
RBC: 4.91 Mil/uL (ref 4.22–5.81)
RDW: 14.2 % (ref 11.5–15.5)
WBC: 8.2 10*3/uL (ref 4.0–10.5)

## 2021-08-05 LAB — COMPREHENSIVE METABOLIC PANEL
ALT: 23 U/L (ref 0–53)
AST: 12 U/L (ref 0–37)
Albumin: 4.3 g/dL (ref 3.5–5.2)
Alkaline Phosphatase: 89 U/L (ref 39–117)
BUN: 13 mg/dL (ref 6–23)
CO2: 29 mEq/L (ref 19–32)
Calcium: 9.8 mg/dL (ref 8.4–10.5)
Chloride: 100 mEq/L (ref 96–112)
Creatinine, Ser: 0.88 mg/dL (ref 0.40–1.50)
GFR: 96.21 mL/min (ref >60.00)
Glucose, Bld: 170 mg/dL — ABNORMAL HIGH (ref 70–99)
Potassium: 4.6 mEq/L (ref 3.5–5.1)
Sodium: 137 mEq/L (ref 135–145)
Total Bilirubin: 0.5 mg/dL (ref 0.2–1.2)
Total Protein: 6.7 g/dL (ref 6.0–8.3)

## 2021-08-05 LAB — LDL CHOLESTEROL, DIRECT: Direct LDL: 86 mg/dL

## 2021-08-05 LAB — HEMOGLOBIN A1C: Hgb A1c MFr Bld: 8.3 % — ABNORMAL HIGH (ref 4.6–6.5)

## 2021-08-05 NOTE — Progress Notes (Signed)
Per the orders of Dr.Kremer pt is here for labs, pt tolerated draw well. Pt unable to provide urine sample as requested.

## 2021-08-13 ENCOUNTER — Telehealth: Payer: Self-pay | Admitting: Family Medicine

## 2021-08-13 ENCOUNTER — Ambulatory Visit: Payer: 59 | Admitting: Family Medicine

## 2021-08-13 NOTE — Telephone Encounter (Signed)
Pt was scheduled today for an OV at 1130.... he thought it was supposed to be a video visit to discuss labs... can we fit him in sometime soon to discuss labs, via mychart video?   Please advise.Marland KitchenMarland Kitchen

## 2021-08-15 ENCOUNTER — Encounter: Payer: Self-pay | Admitting: Family Medicine

## 2021-08-15 ENCOUNTER — Telehealth: Payer: Self-pay | Admitting: Family Medicine

## 2021-08-15 ENCOUNTER — Other Ambulatory Visit: Payer: Self-pay

## 2021-08-15 ENCOUNTER — Telehealth (INDEPENDENT_AMBULATORY_CARE_PROVIDER_SITE_OTHER): Payer: 59 | Admitting: Family Medicine

## 2021-08-15 VITALS — Ht 67.0 in

## 2021-08-15 DIAGNOSIS — E7800 Pure hypercholesterolemia, unspecified: Secondary | ICD-10-CM

## 2021-08-15 DIAGNOSIS — D75839 Thrombocytosis, unspecified: Secondary | ICD-10-CM

## 2021-08-15 DIAGNOSIS — E1165 Type 2 diabetes mellitus with hyperglycemia: Secondary | ICD-10-CM

## 2021-08-15 DIAGNOSIS — I10 Essential (primary) hypertension: Secondary | ICD-10-CM | POA: Diagnosis not present

## 2021-08-15 HISTORY — DX: Thrombocytosis, unspecified: D75.839

## 2021-08-15 HISTORY — DX: Pure hypercholesterolemia, unspecified: E78.00

## 2021-08-15 MED ORDER — RYBELSUS 3 MG PO TABS: 3.0000 mg | ORAL_TABLET | Freq: Every day | ORAL | 5 refills | Status: DC

## 2021-08-15 NOTE — Telephone Encounter (Signed)
Appointment scheduled.

## 2021-08-15 NOTE — Telephone Encounter (Signed)
Spoke with patient who verbally understood Rx was sent in earlier today but it was sent to print off instead of e-scribe to patients pharmacy. Rx resent patient aware it should be ready for pick up today.

## 2021-08-15 NOTE — Progress Notes (Signed)
Established Patient Office Visit  Subjective:  Patient ID: [name] [name], male    DOB: 1966-04-03  Age: 55 y.o. MRN: [id number]  CC:  Chief Complaint  Patient presents with   Advice Only    Discuss labs    HPI [name] [name] presents for a follow-up discussion of his recent lab work and in consideration for changes in therapy.  Status post maximizing dose of metformin 1000 mg twice daily, hemoglobin A1c is 8.3.  Blood pressure has been running in the less than 120/80 range on lisinopril and low-dose amlodipine.  LDL cholesterol in the lipid profile was 79.  Appointment with cardiology is in a few weeks to discuss discontinuation of colchicine with his history of 2 bouts of pericarditis.  Past Medical History:  Diagnosis Date   Allergy    Arthritis    Asthma    Heart murmur    Hypertension    Kidney stones    Lyme disease    Pericarditis    Pneumonia     Past Surgical History:  Procedure Laterality Date   HERNIA REPAIR      Family History  Problem Relation Age of Onset   Diabetes Mother    Cancer Mother        CHF   Heart disease Mother        CHF   Asthma Mother    Lung cancer Mother        smoked   Cancer Father        prostate   Diabetes Father    Kidney disease Father        CANCER   Stroke Daughter    Diabetes Maternal Grandmother    Heart disease Maternal Grandfather        HEART ATTACK   Colon cancer Neg Hx     Social History   Socioeconomic History   Marital status: Married    Spouse name: Not on file   Number of children: Not on file   Years of education: Not on file   Highest education level: Not on file  Occupational History   Occupation: Glass blower/designer  Tobacco Use   Smoking status: Never   Smokeless tobacco: Never  Vaping Use   Vaping Use: Never used  Substance and Sexual Activity   Alcohol use: No    Alcohol/week: 0.0 standard drinks   Drug use: No   Sexual activity: Yes  Other Topics Concern   Not on file  Social History  Narrative   Not on file   Social Determinants of Health   Financial Resource Strain: Not on file  Food Insecurity: Not on file  Transportation Needs: Not on file  Physical Activity: Not on file  Stress: Not on file  Social Connections: Not on file  Intimate Partner Violence: Not on file    Outpatient Medications Prior to Visit  Medication Sig Dispense Refill   aspirin 81 MG tablet Take 81 mg by mouth daily.     atorvastatin (LIPITOR) 40 MG tablet TAKE 1 TABLET BY MOUTH EVERYDAY AT BEDTIME 90 tablet 3   calcium carbonate (TUMS - DOSED IN MG ELEMENTAL CALCIUM) 500 MG chewable tablet Chew 1-2 tablets by mouth 2 (two) times daily as needed for indigestion or heartburn.     Coenzyme Q10 (CO Q 10 PO) Take 1 capsule by mouth at bedtime.      colchicine 0.6 MG tablet TAKE 1 TABLET BY MOUTH EVERYDAY AT BEDTIME 90 tablet 0   lisinopril (ZESTRIL)  40 MG tablet Take 1 tablet (40 mg total) by mouth daily. 90 tablet 1   metFORMIN (GLUCOPHAGE) 1000 MG tablet Take 1,000 mg by mouth 2 (two) times daily.     Multiple Vitamin (MULTIVITAMIN) tablet Take 1 tablet by mouth daily. Reported on 12/20/2015     Omega-3 Fatty Acids (OMEGA-3 FISH OIL) 1200 MG CAPS Take by mouth.     pantoprazole (PROTONIX) 40 MG tablet TAKE 1 TABLET BY MOUTH DAILY AS NEEDED. 90 tablet 3   amLODipine (NORVASC) 5 MG tablet Take 5 mg by mouth daily.     No facility-administered medications prior to visit.    No Known Allergies  ROS Review of Systems  Constitutional:  Negative for chills, diaphoresis, fatigue, fever and unexpected weight change.  HENT: Negative.    Eyes:  Negative for photophobia and visual disturbance.  Respiratory: Negative.    Cardiovascular: Negative.   Gastrointestinal: Negative.   Endocrine: Negative for polyphagia.  Psychiatric/Behavioral: Negative.       Objective:    Physical Exam Vitals and nursing note reviewed.  Constitutional:      General: He is not in acute distress.    Appearance:  Normal appearance. He is normal weight. He is not ill-appearing, toxic-appearing or diaphoretic.  HENT:     Head: Normocephalic and atraumatic.  Eyes:     General: No scleral icterus.       Right eye: No discharge.        Left eye: No discharge.     Conjunctiva/sclera: Conjunctivae normal.  Pulmonary:     Effort: Pulmonary effort is normal.  Neurological:     Mental Status: He is alert and oriented to person, place, and time.  Psychiatric:        Mood and Affect: Mood normal.        Behavior: Behavior normal.    Ht 5\' 7"  (1.702 m)   BMI 26.50 kg/m  Wt Readings from Last 3 Encounters:  08/01/21 169 lb 3.2 oz (76.7 kg)  02/06/20 170 lb (77.1 kg)  12/16/19 175 lb (79.4 kg)     Health Maintenance Due  Topic Date Due   Hepatitis C Screening  Never done   Zoster Vaccines- Shingrix (1 of 2) Never done   Pneumococcal Vaccine 66-38 Years old (3 - PPSV23 if available, else PCV20) 09/23/2019   FOOT EXAM  08/08/2020   COLONOSCOPY (Pts 45-57yrs Insurance coverage will need to be confirmed)  12/19/2020    There are no preventive care reminders to display for this patient.  Lab Results  Component Value Date   TSH 1.260 01/28/2019   Lab Results  Component Value Date   WBC 8.2 08/05/2021   HGB 13.9 08/05/2021   HCT 41.5 08/05/2021   MCV 84.4 08/05/2021   PLT 408.0 (H) 08/05/2021   Lab Results  Component Value Date   NA 137 08/05/2021   K 4.6 08/05/2021   CO2 29 08/05/2021   GLUCOSE 170 (H) 08/05/2021   BUN 13 08/05/2021   CREATININE 0.88 08/05/2021   BILITOT 0.5 08/05/2021   ALKPHOS 89 08/05/2021   AST 12 08/05/2021   ALT 23 08/05/2021   PROT 6.7 08/05/2021   ALBUMIN 4.3 08/05/2021   CALCIUM 9.8 08/05/2021   ANIONGAP 7 09/15/2017   GFR 96.21 08/05/2021   Lab Results  Component Value Date   CHOL 151 08/05/2021   Lab Results  Component Value Date   HDL 46.00 08/05/2021   Lab Results  Component Value Date  [place] 79 08/05/2021   Lab Results  Component  Value Date   TRIG 127.0 08/05/2021   Lab Results  Component Value Date   CHOLHDL 3 08/05/2021   Lab Results  Component Value Date   HGBA1C 8.3 (H) 08/05/2021      Assessment & Plan:   Problem List Items Addressed This Visit       Cardiovascular and Mediastinum   Essential hypertension - Primary     Endocrine   Type 2 diabetes mellitus with hyperglycemia, without long-term current use of insulin (HCC)   Relevant Medications   Semaglutide (RYBELSUS) 3 MG TABS     Hematopoietic and Hemostatic   Thrombocytosis     Other   Elevated cholesterol    Meds ordered this encounter  Medications   Semaglutide (RYBELSUS) 3 MG TABS    Sig: Take 3 mg by mouth daily.    Dispense:  30 tablet    Refill:  5    <SAMPLE>     Follow-up: Return in about 3 months (around 11/15/2021).   Patient will hold amlodipine.  Have added Rybelsus 3 mg daily.  There is no family history of thyroid cancer.  We discussed side effects.  Information was given about the medicine.  We will continue a low-fat low-cholesterol diet.  Recheck CBC at next visit. Libby Maw, MD

## 2021-08-15 NOTE — Telephone Encounter (Signed)
Pt called wondering if his Rybelsus was called in... it looks like the pharmacy hasn't received it yet.

## 2021-08-19 ENCOUNTER — Other Ambulatory Visit: Payer: Self-pay

## 2021-08-19 ENCOUNTER — Other Ambulatory Visit (INDEPENDENT_AMBULATORY_CARE_PROVIDER_SITE_OTHER): Payer: 59

## 2021-08-19 DIAGNOSIS — I10 Essential (primary) hypertension: Secondary | ICD-10-CM | POA: Diagnosis not present

## 2021-08-19 DIAGNOSIS — E119 Type 2 diabetes mellitus without complications: Secondary | ICD-10-CM

## 2021-08-19 LAB — MICROALBUMIN / CREATININE URINE RATIO
Creatinine,U: 74.6 mg/dL
Microalb Creat Ratio: 0.9 mg/g (ref 0.0–30.0)
Microalb, Ur: <0.7 mg/dL (ref 0.0–1.9)

## 2021-08-19 LAB — URINALYSIS, ROUTINE W REFLEX MICROSCOPIC
Bilirubin Urine: NEGATIVE
Hgb urine dipstick: NEGATIVE
Ketones, ur: NEGATIVE
Leukocytes,Ua: NEGATIVE
Nitrite: NEGATIVE
RBC / HPF: NONE SEEN (ref 0–?)
Specific Gravity, Urine: 1.02 (ref 1.000–1.030)
Total Protein, Urine: NEGATIVE
Urine Glucose: NEGATIVE
Urobilinogen, UA: 0.2 (ref 0.0–1.0)
pH: 6 (ref 5.0–8.0)

## 2021-08-27 ENCOUNTER — Other Ambulatory Visit: Payer: Self-pay

## 2021-08-27 DIAGNOSIS — I10 Essential (primary) hypertension: Secondary | ICD-10-CM | POA: Insufficient documentation

## 2021-08-27 DIAGNOSIS — N200 Calculus of kidney: Secondary | ICD-10-CM | POA: Insufficient documentation

## 2021-08-27 DIAGNOSIS — R011 Cardiac murmur, unspecified: Secondary | ICD-10-CM | POA: Insufficient documentation

## 2021-08-27 DIAGNOSIS — A6920 Lyme disease, unspecified: Secondary | ICD-10-CM | POA: Insufficient documentation

## 2021-08-27 DIAGNOSIS — T7840XA Allergy, unspecified, initial encounter: Secondary | ICD-10-CM | POA: Insufficient documentation

## 2021-08-27 DIAGNOSIS — J189 Pneumonia, unspecified organism: Secondary | ICD-10-CM | POA: Insufficient documentation

## 2021-08-27 DIAGNOSIS — M1990 Unspecified osteoarthritis, unspecified site: Secondary | ICD-10-CM | POA: Insufficient documentation

## 2021-08-29 ENCOUNTER — Encounter: Payer: Self-pay | Admitting: Cardiology

## 2021-08-29 ENCOUNTER — Other Ambulatory Visit: Payer: Self-pay

## 2021-08-29 ENCOUNTER — Ambulatory Visit: Payer: 59 | Admitting: Cardiology

## 2021-08-29 VITALS — BP 120/64 | HR 63 | Ht 67.6 in | Wt 167.1 lb

## 2021-08-29 DIAGNOSIS — I10 Essential (primary) hypertension: Secondary | ICD-10-CM | POA: Diagnosis not present

## 2021-08-29 DIAGNOSIS — R011 Cardiac murmur, unspecified: Secondary | ICD-10-CM

## 2021-08-29 DIAGNOSIS — E1165 Type 2 diabetes mellitus with hyperglycemia: Secondary | ICD-10-CM | POA: Diagnosis not present

## 2021-08-29 DIAGNOSIS — E7800 Pure hypercholesterolemia, unspecified: Secondary | ICD-10-CM | POA: Diagnosis not present

## 2021-08-29 DIAGNOSIS — I319 Disease of pericardium, unspecified: Secondary | ICD-10-CM | POA: Diagnosis not present

## 2021-08-29 NOTE — Progress Notes (Signed)
Cardiology Office Note:    Date:  08/29/2021   ID:  Vale Haven, DOB 03-24-1966, MRN [id number]  PCP:  Libby Maw, MD  Cardiologist:  Jenean Lindau, MD   Referring MD: Libby Maw,*    ASSESSMENT:    1. Chronic pericarditis, unspecified complication status, unspecified type   2. Elevated cholesterol   3. Essential hypertension   4. Type 2 diabetes mellitus with hyperglycemia, without long-term current use of insulin (HCC)    PLAN:    In order of problems listed above:  Primary prevention stressed with the patient.  Importance of compliance with diet medication stressed and he vocalized understanding. History of pericarditis: 2 episodes.  Last episode was a couple of years ago.  Patient has been recommended lifelong colchicine and he is not keen on taking it because of risk factors and the fact that he is young and does not desire to take it lifelong.  He wants me to get him my opinion.  He tells me that both times he had these episodes when he had upper respiratory tract infection flareup and this has not happened since the last episode of pericarditis.  Once I conduct the below mentioned tests I will see him in follow-up appointment and discussed the colchicine issue with him at length.  He is agreeable to this. Chest pain: Atypical in nature.  We will do exercise stress echo to assess this in view of his multiple risk factors Cardiac murmur: Echocardiogram will be done to assess murmur heard on auscultation.  We will also help me assess for the pericardium. Essential hypertension, dyslipidemia and diabetes mellitus: Lifestyle modification stressed.  Exercise stressed and he promises to do better.  His hemoglobin A1c is markedly elevated and I discussed the issue with him.  This is followed by primary care. Patient will be seen in follow-up appointment in 3 months or earlier if the patient has any concerns    Medication Adjustments/Labs and Tests  Ordered: Current medicines are reviewed at length with the patient today.  Concerns regarding medicines are outlined above.  No orders of the defined types were placed in this encounter.  No orders of the defined types were placed in this encounter.    History of Present Illness:    [name] [name] is a 55 y.o. male who is being seen today for the evaluation of chronic pericarditis at the request of Libby Maw,*.  Patient is a pleasant 55 year old male.  He has past medical history of essential hypertension, dyslipidemia, diabetes mellitus and pericarditis.  He mentions to me that he had 2 episodes of pericarditis approximately in 2015 and 2018.  Subsequently has had no problems.  No chest pain orthopnea or PND.  At the time of my evaluation, the patient is alert awake oriented and in no distress.  The patient mentions to me that since his last episode he has not had any issues.  He has been recommended colchicine for lifelong.  He is not keen on it.  He wants to be evaluated and needs a second opinion.  Past Medical History:  Diagnosis Date   Allergy    Arthritis    Chronic pericarditis 11/28/2017   Elevated cholesterol 08/15/2021   Essential hypertension 11/28/2017   Heart murmur    Hypertension    Kidney stones    Lyme disease    Pneumonia    Thrombocytosis 08/15/2021   Type 2 diabetes mellitus with hyperglycemia, without long-term current use of insulin ([place]) 03/19/2017  Past Surgical History:  Procedure Laterality Date   HERNIA REPAIR      Current Medications: Current Meds  Medication Sig   aspirin 81 MG tablet Take 81 mg by mouth daily.   atorvastatin (LIPITOR) 40 MG tablet TAKE 1 TABLET BY MOUTH EVERYDAY AT BEDTIME   calcium carbonate (TUMS - DOSED IN MG ELEMENTAL CALCIUM) 500 MG chewable tablet Chew 1-2 tablets by mouth 2 (two) times daily as needed for indigestion or heartburn.   Coenzyme Q10 (CO Q 10 PO) Take 1 capsule by mouth at bedtime.    colchicine 0.6 MG  tablet TAKE 1 TABLET BY MOUTH EVERYDAY AT BEDTIME   lisinopril (ZESTRIL) 40 MG tablet Take 1 tablet (40 mg total) by mouth daily.   metFORMIN (GLUCOPHAGE) 1000 MG tablet Take 1,000 mg by mouth 2 (two) times daily.   Multiple Vitamin (MULTIVITAMIN) tablet Take 1 tablet by mouth daily. Reported on 12/20/2015   Omega-3 Fatty Acids (OMEGA-3 FISH OIL) 1200 MG CAPS Take 1,200 mg by mouth daily.   pantoprazole (PROTONIX) 40 MG tablet Take 40 mg by mouth as needed for indigestion.   Semaglutide (RYBELSUS) 3 MG TABS Take 3 mg by mouth daily.     Allergies:   Patient has no known allergies.   Social History   Socioeconomic History   Marital status: Married    Spouse name: Not on file   Number of children: Not on file   Years of education: Not on file   Highest education level: Not on file  Occupational History   Occupation: Glass blower/designer  Tobacco Use   Smoking status: Never   Smokeless tobacco: Never  Vaping Use   Vaping Use: Never used  Substance and Sexual Activity   Alcohol use: No    Alcohol/week: 0.0 standard drinks   Drug use: No   Sexual activity: Yes  Other Topics Concern   Not on file  Social History Narrative   Not on file   Social Determinants of Health   Financial Resource Strain: Not on file  Food Insecurity: Not on file  Transportation Needs: Not on file  Physical Activity: Not on file  Stress: Not on file  Social Connections: Not on file     Family History: The patient's family history includes Asthma in his mother; Cancer in his father and mother; Diabetes in his father, maternal grandmother, and mother; Heart disease in his maternal grandfather and mother; Kidney disease in his father; Lung cancer in his mother; Stroke in his daughter. There is no history of Colon cancer.  ROS:   Please see the history of present illness.    All other systems reviewed and are negative.  EKGs/Labs/Other Studies Reviewed:    The following studies were reviewed today: EKG  reveals sinus rhythm and nonspecific ST-T changes   Recent Labs: 08/05/2021: ALT 23; BUN 13; Creatinine, Ser 0.88; Hemoglobin 13.9; Platelets 408.0; Potassium 4.6; Sodium 137  Recent Lipid Panel    Component Value Date/Time   CHOL 151 08/05/2021 0845   CHOL 148 12/16/2019 1048   TRIG 127.0 08/05/2021 0845   HDL 46.00 08/05/2021 0845   HDL 47 12/16/2019 1048   CHOLHDL 3 08/05/2021 0845   VLDL 25.4 08/05/2021 0845   LDLCALC 79 08/05/2021 0845   LDLCALC 88 12/16/2019 1048   LDLDIRECT 86.0 08/05/2021 0845    Physical Exam:    VS:  BP 120/64   Pulse 63   Ht 5' 7.6" (1.717 m)   Wt 167 lb 1.9 oz (  75.8 kg)   SpO2 99%   BMI 25.71 kg/m     Wt Readings from Last 3 Encounters:  08/29/21 167 lb 1.9 oz (75.8 kg)  08/01/21 169 lb 3.2 oz (76.7 kg)  02/06/20 170 lb (77.1 kg)     GEN: Patient is in no acute distress HEENT: Normal NECK: No JVD; No carotid bruits LYMPHATICS: No lymphadenopathy CARDIAC: S1 S2 regular, 2/6 systolic murmur at the apex. RESPIRATORY:  Clear to auscultation without rales, wheezing or rhonchi  ABDOMEN: Soft, non-tender, non-distended MUSCULOSKELETAL:  No edema; No deformity  SKIN: Warm and dry NEUROLOGIC:  Alert and oriented x 3 PSYCHIATRIC:  Normal affect    Signed, Jenean Lindau, MD  08/29/2021 9:15 AM    [place]

## 2021-08-29 NOTE — Patient Instructions (Signed)
Medication Instructions:  Your physician recommends that you continue on your current medications as directed. Please refer to the Current Medication list given to you today.  *If you need a refill on your cardiac medications before your next appointment, please call your pharmacy*   Lab Work: None ordered If you have labs (blood work) drawn today and your tests are completely normal, you will receive your results only by: [place] (if you have MyChart) OR A paper copy in the mail If you have any lab test that is abnormal or we need to change your treatment, we will call you to review the results.   Testing/Procedures: Your physician has requested that you have an echocardiogram. Echocardiography is a painless test that uses sound waves to create images of your heart. It provides your doctor with information about the size and shape of your heart and how well your heart's chambers and valves are working. This procedure takes approximately one hour. There are no restrictions for this procedure.      Stress Echocardiogram Information Sheet                                                      Instructions:    1. You may take your morning medications the morning of the test  2. Light breakfast no caffeine  3. Dress prepared to exercise.  4. DO NOT use ANY caffeine or tobacco products 3 hours before appointment.  5. Please bring all current prescription medications.   Follow-Up: At [facility], you and your health needs are our priority.  As part of our continuing mission to provide you with exceptional heart care, we have created designated Provider Care Teams.  These Care Teams include your primary Cardiologist (physician) and Advanced Practice Providers (APPs -  Physician Assistants and Nurse Practitioners) who all work together to provide you with the care you need, when you need it.  We recommend signing up for the patient portal called "MyChart".  Sign up information is  provided on this After Visit Summary.  MyChart is used to connect with patients for Virtual Visits (Telemedicine).  Patients are able to view lab/test results, encounter notes, upcoming appointments, etc.  Non-urgent messages can be sent to your provider as well.   To learn more about what you can do with MyChart, go to [email or web address].    Your next appointment:   3 month(s)  The format for your next appointment:   In Person  Provider:   Jyl Heinz, MD   Other Instructions Echocardiogram An echocardiogram is a test that uses sound waves (ultrasound) to produce images of the heart. Images from an echocardiogram can provide important information about: Heart size and shape. The size and thickness and movement of your heart's walls. Heart muscle function and strength. Heart valve function or if you have stenosis. Stenosis is when the heart valves are too narrow. If blood is flowing backward through the heart valves (regurgitation). A tumor or infectious growth around the heart valves. Areas of heart muscle that are not working well because of poor blood flow or injury from a heart attack. Aneurysm detection. An aneurysm is a weak or damaged part of an artery wall. The wall bulges out from the normal force of blood pumping through the body. Tell a health care provider about: Any allergies you  have. All medicines you are taking, including vitamins, herbs, eye drops, creams, and over-the-counter medicines. Any blood disorders you have. Any surgeries you have had. Any medical conditions you have. Whether you are pregnant or may be pregnant. What are the risks? Generally, this is a safe test. However, problems may occur, including an allergic reaction to dye (contrast) that may be used during the test. What happens before the test? No specific preparation is needed. You may eat and drink normally. What happens during the test? You will take off your clothes from the waist up  and put on a hospital gown. Electrodes or electrocardiogram (ECG)patches may be placed on your chest. The electrodes or patches are then connected to a device that monitors your heart rate and rhythm. You will lie down on a table for an ultrasound exam. A gel will be applied to your chest to help sound waves pass through your skin. A handheld device, called a transducer, will be pressed against your chest and moved over your heart. The transducer produces sound waves that travel to your heart and bounce back (or "echo" back) to the transducer. These sound waves will be captured in real-time and changed into images of your heart that can be viewed on a video monitor. The images will be recorded on a computer and reviewed by your health care provider. You may be asked to change positions or hold your breath for a short time. This makes it easier to get different views or better views of your heart. In some cases, you may receive contrast through an IV in one of your veins. This can improve the quality of the pictures from your heart. The procedure may vary among health care providers and hospitals.   What can I expect after the test? You may return to your normal, everyday life, including diet, activities, and medicines, unless your health care provider tells you not to do that. Follow these instructions at home: It is up to you to get the results of your test. Ask your health care provider, or the department that is doing the test, when your results will be ready. Keep all follow-up visits. This is important. Summary An echocardiogram is a test that uses sound waves (ultrasound) to produce images of the heart. Images from an echocardiogram can provide important information about the size and shape of your heart, heart muscle function, heart valve function, and other possible heart problems. You do not need to do anything to prepare before this test. You may eat and drink normally. After the  echocardiogram is completed, you may return to your normal, everyday life, unless your health care provider tells you not to do that. This information is not intended to replace advice given to you by your health care provider. Make sure you discuss any questions you have with your health care provider. Document Revised: 05/22/2020 Document Reviewed: 05/22/2020 Elsevier Patient Education  2021 [employer].

## 2021-09-08 ENCOUNTER — Encounter: Payer: Self-pay | Admitting: Family Medicine

## 2021-09-09 ENCOUNTER — Other Ambulatory Visit: Payer: Self-pay

## 2021-09-09 MED ORDER — METFORMIN HCL 1000 MG PO TABS
1000.0000 mg | ORAL_TABLET | Freq: Two times a day (BID) | ORAL | 1 refills | Status: DC
Start: 2021-09-09 — End: 2022-03-07

## 2021-09-09 NOTE — Telephone Encounter (Signed)
Pt called regarding this message about his refill on metformin, also has questions about the Rybelsus Rx.

## 2021-09-09 NOTE — Telephone Encounter (Signed)
Refill request for pending Rx. Last new patient appointment 08/03/2021 please advise

## 2021-09-09 NOTE — Telephone Encounter (Signed)
Returned patients call, informing patient that requested Rx was sent in, no answer LMTCB regarding question about other Rx.

## 2021-09-09 NOTE — Telephone Encounter (Signed)
Pt returned call

## 2021-09-30 ENCOUNTER — Other Ambulatory Visit (HOSPITAL_BASED_OUTPATIENT_CLINIC_OR_DEPARTMENT_OTHER): Payer: 59

## 2021-10-04 ENCOUNTER — Other Ambulatory Visit: Payer: Self-pay

## 2021-10-04 ENCOUNTER — Ambulatory Visit (HOSPITAL_BASED_OUTPATIENT_CLINIC_OR_DEPARTMENT_OTHER)
Admission: RE | Admit: 2021-10-04 | Discharge: 2021-10-04 | Disposition: A | Discharge status: Home / Self Care | Payer: 59 | Source: Ambulatory Visit | Attending: Cardiology | Admitting: Cardiology

## 2021-10-04 DIAGNOSIS — I3139 Other pericardial effusion (noninflammatory): Secondary | ICD-10-CM

## 2021-10-04 DIAGNOSIS — I319 Disease of pericardium, unspecified: Secondary | ICD-10-CM | POA: Insufficient documentation

## 2021-10-04 DIAGNOSIS — R011 Cardiac murmur, unspecified: Secondary | ICD-10-CM | POA: Insufficient documentation

## 2021-10-04 LAB — ECHOCARDIOGRAM COMPLETE
AR max vel: 2.98 cm2
AV Area VTI: 2.71 cm2
AV Area mean vel: 2.86 cm2
AV Mean grad: 6 mmHg
AV Peak grad: 11.8 mmHg
Ao pk vel: 1.72 m/s
Area-P 1/2: 3.6 cm2
Calc EF: 63.2 %
S' Lateral: 2.7 cm
Single Plane A2C EF: 66.4 %
Single Plane A4C EF: 61.1 %

## 2021-10-08 ENCOUNTER — Telehealth (HOSPITAL_COMMUNITY): Payer: Self-pay | Admitting: *Deleted

## 2021-10-08 NOTE — Telephone Encounter (Signed)
Left message on voicemail per DPR in reference to upcoming appointment scheduled on 10/15/20 at 1400 with detailed instructions given per Stress Test Requisition Sheet for the test. LM to arrive 30 minutes early, and that it is imperative to arrive on time for appointment to keep from having the test rescheduled. If you need to cancel or reschedule your appointment, please call the office within 24 hours of your appointment. Failure to do so may result in a cancellation of your appointment, and a $50 no show fee. Phone number given for call back for any questions. Orlandus Borowski, [place]

## 2021-10-15 ENCOUNTER — Ambulatory Visit (HOSPITAL_COMMUNITY): Payer: 59

## 2021-10-15 ENCOUNTER — Ambulatory Visit (HOSPITAL_COMMUNITY): Payer: 59 | Attending: Cardiovascular Disease

## 2021-10-15 ENCOUNTER — Other Ambulatory Visit: Payer: Self-pay

## 2021-10-15 DIAGNOSIS — I319 Disease of pericardium, unspecified: Secondary | ICD-10-CM | POA: Diagnosis present

## 2021-10-15 MED ORDER — PERFLUTREN LIPID MICROSPHERE
1.0000 mL | INTRAVENOUS | Status: AC | PRN
  Administered 2021-10-15 (×2): 3 mL via INTRAVENOUS

## 2021-10-17 ENCOUNTER — Telehealth: Payer: Self-pay

## 2021-10-17 ENCOUNTER — Other Ambulatory Visit: Payer: Self-pay | Admitting: Family Medicine

## 2021-10-17 DIAGNOSIS — E119 Type 2 diabetes mellitus without complications: Secondary | ICD-10-CM

## 2021-10-17 NOTE — Telephone Encounter (Signed)
Patient was returning call. Please advise ?

## 2021-10-17 NOTE — Telephone Encounter (Signed)
Spoke with patient regarding results and recommendation.  Patient verbalizes understanding and is agreeable to plan of care. Advised patient to call back with any issues or concerns.  

## 2021-10-17 NOTE — Telephone Encounter (Signed)
-----   Message from Jenean Lindau, MD sent at 10/16/2021  2:03 PM EST ----- The results of the study is unremarkable. Please inform patient. I will discuss in detail at next appointment. Cc  primary care/referring physician Jenean Lindau, MD 10/16/2021 2:03 PM

## 2021-10-17 NOTE — Telephone Encounter (Signed)
Left message on patients voicemail to please return our call.   

## 2021-11-01 ENCOUNTER — Ambulatory Visit: Payer: 59 | Admitting: Family Medicine

## 2021-11-22 ENCOUNTER — Other Ambulatory Visit: Payer: Self-pay

## 2021-11-25 ENCOUNTER — Other Ambulatory Visit: Payer: Self-pay

## 2021-11-25 ENCOUNTER — Encounter: Payer: Self-pay | Admitting: Family Medicine

## 2021-11-25 ENCOUNTER — Ambulatory Visit: Payer: 59 | Admitting: Family Medicine

## 2021-11-25 VITALS — BP 116/74 | HR 66 | Temp 97.8°F | Resp 18 | Wt 176.0 lb

## 2021-11-25 DIAGNOSIS — D75839 Thrombocytosis, unspecified: Secondary | ICD-10-CM | POA: Diagnosis not present

## 2021-11-25 DIAGNOSIS — I319 Disease of pericardium, unspecified: Secondary | ICD-10-CM

## 2021-11-25 DIAGNOSIS — E1165 Type 2 diabetes mellitus with hyperglycemia: Secondary | ICD-10-CM

## 2021-11-25 DIAGNOSIS — J3489 Other specified disorders of nose and nasal sinuses: Secondary | ICD-10-CM

## 2021-11-25 DIAGNOSIS — E7800 Pure hypercholesterolemia, unspecified: Secondary | ICD-10-CM | POA: Diagnosis not present

## 2021-11-25 DIAGNOSIS — I10 Essential (primary) hypertension: Secondary | ICD-10-CM

## 2021-11-25 DIAGNOSIS — Z23 Encounter for immunization: Secondary | ICD-10-CM

## 2021-11-25 HISTORY — DX: Encounter for immunization: Z23

## 2021-11-25 LAB — CBC
HCT: 42.2 % (ref 39.0–52.0)
Hemoglobin: 14 g/dL (ref 13.0–17.0)
MCHC: 33.2 g/dL (ref 30.0–36.0)
MCV: 83.9 fl (ref 78.0–100.0)
Platelets: 413 10*3/uL — ABNORMAL HIGH (ref 150.0–400.0)
RBC: 5.03 Mil/uL (ref 4.22–5.81)
RDW: 13.8 % (ref 11.5–15.5)
WBC: 8.2 10*3/uL (ref 4.0–10.5)

## 2021-11-25 LAB — HEMOGLOBIN A1C: Hgb A1c MFr Bld: 7.6 % — ABNORMAL HIGH (ref 4.6–6.5)

## 2021-11-25 MED ORDER — CETIRIZINE HCL 10 MG PO TABS: 10.0000 mg | ORAL_TABLET | Freq: Every day | ORAL | 11 refills | Status: AC

## 2021-11-25 NOTE — Progress Notes (Addendum)
Established Patient Office Visit  Subjective:  Patient ID: [name] [name], male    DOB: [birth year]  Age: 56 y.o. MRN: [id number]  CC:  Chief Complaint  Patient presents with   Follow-up    3 mo follow up. Pt is fasting pt states he is taking medication.    HPI [name] [name] presents for follow-up of diabetes, hypertension, elevated cholesterol, chronic pericarditis and ongoing rhinorrhea.  Has taken Flonase sporadically for ongoing clear rhinorrhea.  Reports occasional sneeze.  There is no fever chills facial pressure or purulent discharge.  Seen at urgent care and prescribed an antibiotic and this did not help.  Blood pressure is running 120/70 at home.  Doing well with Rybelsus at low-dose.  Cardiology recommended that he continue colchicine for colds and flu symptoms only.  Past Medical History:  Diagnosis Date   Allergy    Arthritis    Chronic pericarditis 11/28/2017   Elevated cholesterol 08/15/2021   Essential hypertension 11/28/2017   Heart murmur    Hypertension    Kidney stones    Lyme disease    Pneumonia    Thrombocytosis 08/15/2021   Type 2 diabetes mellitus with hyperglycemia, without long-term current use of insulin ([place]) 03/19/2017    Past Surgical History:  Procedure Laterality Date   HERNIA REPAIR      Family History  Problem Relation Age of Onset   Diabetes Mother    Cancer Mother        CHF   Heart disease Mother        CHF   Asthma Mother    Lung cancer Mother        smoked   Cancer Father        prostate   Diabetes Father    Kidney disease Father        CANCER   Stroke Daughter    Diabetes Maternal Grandmother    Heart disease Maternal Grandfather        HEART ATTACK   Colon cancer Neg Hx     Social History   Socioeconomic History   Marital status: Married    Spouse name: Not on file   Number of children: Not on file   Years of education: Not on file   Highest education level: Not on file  Occupational History   Occupation: Environmental education officer  Tobacco Use   Smoking status: Never   Smokeless tobacco: Never  Vaping Use   Vaping Use: Never used  Substance and Sexual Activity   Alcohol use: No    Alcohol/week: 0.0 standard drinks   Drug use: No   Sexual activity: Yes  Other Topics Concern   Not on file  Social History Narrative   Not on file   Social Determinants of Health   Financial Resource Strain: Not on file  Food Insecurity: Not on file  Transportation Needs: Not on file  Physical Activity: Not on file  Stress: Not on file  Social Connections: Not on file  Intimate Partner Violence: Not on file    Outpatient Medications Prior to Visit  Medication Sig Dispense Refill   aspirin 81 MG tablet Take 81 mg by mouth daily.     atorvastatin (LIPITOR) 40 MG tablet TAKE 1 TABLET BY MOUTH AT BEDTIME 90 tablet 3   calcium carbonate (TUMS - DOSED IN MG ELEMENTAL CALCIUM) 500 MG chewable tablet Chew 1-2 tablets by mouth 2 (two) times daily as needed for indigestion or heartburn.     Coenzyme Q10 (  CO Q 10 PO) Take 1 capsule by mouth at bedtime.      lisinopril (ZESTRIL) 40 MG tablet TAKE 1 TABLET BY MOUTH EVERY DAY 90 tablet 1   metFORMIN (GLUCOPHAGE) 1000 MG tablet Take 1 tablet (1,000 mg total) by mouth 2 (two) times daily. 180 tablet 1   Multiple Vitamin (MULTIVITAMIN) tablet Take 1 tablet by mouth daily. Reported on 12/20/2015     Omega-3 Fatty Acids (OMEGA-3 FISH OIL) 1200 MG CAPS Take 1,200 mg by mouth daily.     pantoprazole (PROTONIX) 40 MG tablet Take 40 mg by mouth as needed for indigestion.     Semaglutide (RYBELSUS) 3 MG TABS Take 3 mg by mouth daily. 30 tablet 5   colchicine 0.6 MG tablet TAKE 1 TABLET BY MOUTH EVERYDAY AT BEDTIME 90 tablet 0   No facility-administered medications prior to visit.    No Known Allergies  ROS Review of Systems  Constitutional:  Negative for chills, diaphoresis, fatigue, fever and unexpected weight change.  HENT:  Positive for postnasal drip, rhinorrhea and sneezing.  Negative for congestion.   Eyes:  Positive for visual disturbance. Negative for photophobia.  Respiratory: Negative.    Cardiovascular: Negative.   Gastrointestinal: Negative.   Endocrine: Negative for polyphagia and polyuria.  Genitourinary: Negative.   Musculoskeletal:  Negative for gait problem and joint swelling.  Neurological:  Negative for speech difficulty, weakness and headaches.  Psychiatric/Behavioral: Negative.       Objective:    Physical Exam Vitals and nursing note reviewed.  Constitutional:      General: He is not in acute distress.    Appearance: Normal appearance. He is not ill-appearing, toxic-appearing or diaphoretic.  HENT:     Head: Normocephalic and atraumatic.     Right Ear: Tympanic membrane, ear canal and external ear normal.     Left Ear: Tympanic membrane, ear canal and external ear normal.     Mouth/Throat:     Mouth: Mucous membranes are moist.     Pharynx: Oropharynx is clear. No oropharyngeal exudate or posterior oropharyngeal erythema.  Eyes:     General:        Right eye: No discharge.        Left eye: No discharge.     Extraocular Movements: Extraocular movements intact.     Conjunctiva/sclera: Conjunctivae normal.     Pupils: Pupils are equal, round, and reactive to light.  Cardiovascular:     Rate and Rhythm: Normal rate and regular rhythm.  Pulmonary:     Effort: Pulmonary effort is normal.     Breath sounds: Normal breath sounds.  Abdominal:     General: Bowel sounds are normal.  Musculoskeletal:     Cervical back: No rigidity or tenderness.  Lymphadenopathy:     Cervical: No cervical adenopathy.  Skin:    General: Skin is warm and dry.  Neurological:     Mental Status: He is alert and oriented to person, place, and time.  Psychiatric:        Mood and Affect: Mood normal.        Behavior: Behavior normal.    BP 116/74 (BP Location: Left Arm)    Pulse 66    Temp 97.8 F (36.6 C) (Temporal)    Resp 18    Wt 176 lb (79.8 kg)     SpO2 99%    BMI 27.08 kg/m  Wt Readings from Last 3 Encounters:  11/25/21 176 lb (79.8 kg)  08/29/21 167 lb 1.9 oz (  75.8 kg)  08/01/21 169 lb 3.2 oz (76.7 kg)     Health Maintenance Due  Topic Date Due   Hepatitis C Screening  Never done   FOOT EXAM  08/08/2020   COLONOSCOPY (Pts 45-70yrs Insurance coverage will need to be confirmed)  12/19/2020    There are no preventive care reminders to display for this patient.  Lab Results  Component Value Date   TSH 1.260 01/28/2019   Lab Results  Component Value Date   WBC 8.2 11/25/2021   HGB 14.0 11/25/2021   HCT 42.2 11/25/2021   MCV 83.9 11/25/2021   PLT 413.0 (H) 11/25/2021   Lab Results  Component Value Date   NA 139 11/25/2021   K 4.7 11/25/2021   CO2 30 11/25/2021   GLUCOSE 150 (H) 11/25/2021   BUN 14 11/25/2021   CREATININE 0.92 11/25/2021   BILITOT 0.5 08/05/2021   ALKPHOS 89 08/05/2021   AST 12 08/05/2021   ALT 23 08/05/2021   PROT 6.7 08/05/2021   ALBUMIN 4.3 08/05/2021   CALCIUM 9.7 11/25/2021   ANIONGAP 7 09/15/2017   GFR 92.87 11/25/2021   Lab Results  Component Value Date   CHOL 139 11/25/2021   Lab Results  Component Value Date   HDL 44.70 11/25/2021   Lab Results  Component Value Date   LDLCALC 74 11/25/2021   Lab Results  Component Value Date   TRIG 100.0 11/25/2021   Lab Results  Component Value Date   CHOLHDL 3 11/25/2021   Lab Results  Component Value Date   HGBA1C 7.6 (H) 11/25/2021      Assessment & Plan:   Problem List Items Addressed This Visit       Cardiovascular and Mediastinum   Chronic pericarditis   Essential hypertension   Relevant Orders   Basic metabolic panel (Completed)     Endocrine   Type 2 diabetes mellitus with hyperglycemia, without long-term current use of insulin ([facility]) - Primary   Relevant Medications   Semaglutide (RYBELSUS) 7 MG TABS   Other Relevant Orders   Basic metabolic panel (Completed)   Hemoglobin A1c (Completed)     Hematopoietic  and Hemostatic   Thrombocytosis   Relevant Orders   CBC (Completed)     Other   Rhinorrhea   Relevant Medications   cetirizine (ZYRTEC) 10 MG tablet   Elevated cholesterol   Relevant Orders   LDL cholesterol, direct (Completed)   Lipid panel (Completed)   Need for shingles vaccine   Relevant Orders   Varicella-zoster vaccine IM (Shingrix) (Completed)    Meds ordered this encounter  Medications   cetirizine (ZYRTEC) 10 MG tablet    Sig: Take 1 tablet (10 mg total) by mouth at bedtime.    Dispense:  30 tablet    Refill:  11   Semaglutide (RYBELSUS) 7 MG TABS    Sig: Take 7 mg by mouth daily.    Dispense:  90 tablet    Refill:  1    Follow-up: Return in about 6 months (around 05/25/2022), or if symptoms worsen or fail to improve.  Have added Zyrtec for rhinorrhea discussed the importance of using the Flonase daily and consistently.  May need to increase semaglutide pending hemoglobin A1c.  Continue lisinopril for hypertension.  First shingles vaccine today and a second in 6 months.  Libby Maw, MD

## 2021-11-26 LAB — LIPID PANEL
Cholesterol: 139 mg/dL (ref 0–200)
HDL: 44.7 mg/dL (ref >39.00)
LDL Cholesterol: 74 mg/dL (ref 0–99)
NonHDL: 94.17
Total CHOL/HDL Ratio: 3
Triglycerides: 100 mg/dL (ref 0.0–149.0)
VLDL: 20 mg/dL (ref 0.0–40.0)

## 2021-11-26 LAB — BASIC METABOLIC PANEL
BUN: 14 mg/dL (ref 6–23)
CO2: 30 mEq/L (ref 19–32)
Calcium: 9.7 mg/dL (ref 8.4–10.5)
Chloride: 104 mEq/L (ref 96–112)
Creatinine, Ser: 0.92 mg/dL (ref 0.40–1.50)
GFR: 92.87 mL/min (ref >60.00)
Glucose, Bld: 150 mg/dL — ABNORMAL HIGH (ref 70–99)
Potassium: 4.7 mEq/L (ref 3.5–5.1)
Sodium: 139 mEq/L (ref 135–145)

## 2021-11-26 LAB — LDL CHOLESTEROL, DIRECT: Direct LDL: 85 mg/dL

## 2021-11-27 MED ORDER — RYBELSUS 7 MG PO TABS: 7.0000 mg | ORAL_TABLET | Freq: Every day | ORAL | 1 refills | Status: DC

## 2021-11-27 NOTE — Addendum Note (Signed)
Addended by: Jon Billings on: 11/27/2021 08:04 AM   Modules accepted: Orders

## 2021-12-13 ENCOUNTER — Ambulatory Visit: Payer: 59 | Admitting: Cardiology

## 2022-01-15 ENCOUNTER — Other Ambulatory Visit: Payer: Self-pay

## 2022-01-21 ENCOUNTER — Ambulatory Visit: Payer: 59 | Admitting: Cardiology

## 2022-01-21 ENCOUNTER — Encounter: Payer: Self-pay | Admitting: Cardiology

## 2022-01-21 VITALS — BP 136/74 | HR 60 | Ht 67.6 in | Wt 166.0 lb

## 2022-01-21 DIAGNOSIS — E7800 Pure hypercholesterolemia, unspecified: Secondary | ICD-10-CM | POA: Diagnosis not present

## 2022-01-21 DIAGNOSIS — B3323 Viral pericarditis: Secondary | ICD-10-CM

## 2022-01-21 DIAGNOSIS — I10 Essential (primary) hypertension: Secondary | ICD-10-CM

## 2022-01-21 DIAGNOSIS — E1165 Type 2 diabetes mellitus with hyperglycemia: Secondary | ICD-10-CM

## 2022-01-21 DIAGNOSIS — I319 Disease of pericardium, unspecified: Secondary | ICD-10-CM | POA: Insufficient documentation

## 2022-01-21 NOTE — Patient Instructions (Signed)

## 2022-01-21 NOTE — Progress Notes (Signed)
?Cardiology Office Note:   ? ?Date:  01/21/2022  ? ?ID:  [name] [name], DOB 30-Jan-1966, MRN [id number] ? ?PCP:  Libby Maw, MD  ?Cardiologist:  Jenean Lindau, MD  ? ?Referring MD: Libby Maw,*  ? ? ?ASSESSMENT:   ? ?1. Essential hypertension   ?2. Type 2 diabetes mellitus with hyperglycemia, without long-term current use of insulin ([place])   ?3. Elevated cholesterol   ?4. Viral pericarditis, unspecified chronicity   ? ?PLAN:   ? ?In order of problems listed above: ? ?Primary prevention stressed with the patient.  Importance of compliance with diet and medication stressed any vocalized understanding.  He was advised to walk at least half an hour a day 5 days a week and he promises to do so. ?History of pericarditis: Stable at this time.  He does not want to be on colchicine unless he has an episode of pericarditis when he has an upper respiratory tract infection he said he will use it.  I respect his wishes. ?Essential hypertension: Blood pressure stable and diet was emphasized. ?Mixed dyslipidemia: On statin therapy and followed by primary care. ?Diabetes mellitus: Uncontrolled: Hemoglobin A1c A1c is elevated and his primary care provider is following this very closely.  Diet emphasized. ?Patient will be seen in follow-up appointment in 9 months or earlier if the patient has any concerns ? ? ? ?Medication Adjustments/Labs and Tests Ordered: ?Current medicines are reviewed at length with the patient today.  Concerns regarding medicines are outlined above.  ?No orders of the defined types were placed in this encounter. ? ?No orders of the defined types were placed in this encounter. ? ? ? ?No chief complaint on file. ?  ? ?History of Present Illness:   ? ?[name] [name] is a 56 y.o. male.  Patient has past medical history of essential hypertension, dyslipidemia, diabetes mellitus and 2 episodes of pericarditis in the past.  He mentions to me that when he has upper respiratory tract infection  flareup he gets pericarditis.  He denies any chest pain orthopnea or PND at this time.  He is an active gentleman and exercises on a regular basis.  At the time of my evaluation, the patient is alert awake oriented and in no distress. ? ?Past Medical History:  ?Diagnosis Date  ? Allergy   ? Arthritis   ? Chronic pericarditis 11/28/2017  ? Elevated cholesterol 08/15/2021  ? Essential hypertension 11/28/2017  ? Heart murmur   ? Hypertension   ? Kidney stones   ? Lyme disease   ? Need for shingles vaccine 11/25/2021  ? Pneumonia   ? Rhinorrhea 09/16/2014  ? - Added flutter valve to rx 08/02/14  - ent referral 08/14/2014 > [facility] eval c/w severe fungal laryngitis > improved on diflucan - allergy profile 08/14/14  >  IgE 39 with neg RAST Sinus ct 10/17/2014 > Minimal mucosal thickening RIGHT maxillary sinus. No additional sinus abnormalities identified.  ? Thrombocytosis 08/15/2021  ? Type 2 diabetes mellitus with hyperglycemia, without long-term current use of insulin ([place]) 03/19/2017  ? ? ?Past Surgical History:  ?Procedure Laterality Date  ? HERNIA REPAIR    ? ? ?Current Medications: ?Current Meds  ?Medication Sig  ? aspirin 81 MG tablet Take 81 mg by mouth daily.  ? atorvastatin (LIPITOR) 40 MG tablet TAKE 1 TABLET BY MOUTH AT BEDTIME  ? calcium carbonate (TUMS - DOSED IN MG ELEMENTAL CALCIUM) 500 MG chewable tablet Chew 1-2 tablets by mouth 2 (two) times daily as needed  for indigestion or heartburn.  ? cetirizine (ZYRTEC) 10 MG tablet Take 1 tablet (10 mg total) by mouth at bedtime.  ? Coenzyme Q10 (CO Q 10 PO) Take 1 capsule by mouth at bedtime.   ? lisinopril (ZESTRIL) 40 MG tablet TAKE 1 TABLET BY MOUTH EVERY DAY  ? metFORMIN (GLUCOPHAGE) 1000 MG tablet Take 1 tablet (1,000 mg total) by mouth 2 (two) times daily.  ? Multiple Vitamin (MULTIVITAMIN) tablet Take 1 tablet by mouth daily. Reported on 12/20/2015  ? Omega-3 Fatty Acids (OMEGA-3 FISH OIL) 1200 MG CAPS Take 1,200 mg by mouth daily.  ? pantoprazole (PROTONIX) 40 MG  tablet Take 40 mg by mouth as needed for indigestion.  ? Semaglutide (RYBELSUS) 7 MG TABS Take 7 mg by mouth daily.  ?  ? ?Allergies:   Patient has no known allergies.  ? ?Social History  ? ?Socioeconomic History  ? Marital status: Married  ?  Spouse name: Not on file  ? Number of children: Not on file  ? Years of education: Not on file  ? Highest education level: Not on file  ?Occupational History  ? Occupation: Glass blower/designer  ?Tobacco Use  ? Smoking status: Never  ? Smokeless tobacco: Never  ?Vaping Use  ? Vaping Use: Never used  ?Substance and Sexual Activity  ? Alcohol use: No  ?  Alcohol/week: 0.0 standard drinks  ? Drug use: No  ? Sexual activity: Yes  ?Other Topics Concern  ? Not on file  ?Social History Narrative  ? Not on file  ? ?Social Determinants of Health  ? ?Financial Resource Strain: Not on file  ?Food Insecurity: Not on file  ?Transportation Needs: Not on file  ?Physical Activity: Not on file  ?Stress: Not on file  ?Social Connections: Not on file  ?  ? ?Family History: ?The patient's family history includes Asthma in his mother; Cancer in his father and mother; Diabetes in his father, maternal grandmother, and mother; Heart disease in his maternal grandfather and mother; Kidney disease in his father; Lung cancer in his mother; Stroke in his daughter. There is no history of Colon cancer. ? ?ROS:   ?Please see the history of present illness.    ?All other systems reviewed and are negative. ? ?EKGs/Labs/Other Studies Reviewed:   ? ?The following studies were reviewed today: ?I discussed my findings with the patient at length and echo and stress echo report was discussed with him. ? ? ?Recent Labs: ?08/05/2021: ALT 23 ?11/25/2021: BUN 14; Creatinine, Ser 0.92; Hemoglobin 14.0; Platelets 413.0; Potassium 4.7; Sodium 139  ?Recent Lipid Panel ?   ?Component Value Date/Time  ? CHOL 139 11/25/2021 0913  ? CHOL 148 12/16/2019 1048  ? TRIG 100.0 11/25/2021 0913  ? HDL 44.70 11/25/2021 0913  ? HDL 47  12/16/2019 1048  ? CHOLHDL 3 11/25/2021 0913  ? VLDL 20.0 11/25/2021 0913  ? [place] 74 11/25/2021 0913  ? [place] 88 12/16/2019 1048  ? LDLDIRECT 85.0 11/25/2021 0913  ? ? ?Physical Exam:   ? ?VS:  BP 136/74   Pulse 60   Ht 5' 7.6" (1.717 m)   Wt 166 lb (75.3 kg)   SpO2 99%   BMI 25.54 kg/m?    ? ?Wt Readings from Last 3 Encounters:  ?01/21/22 166 lb (75.3 kg)  ?11/25/21 176 lb (79.8 kg)  ?08/29/21 167 lb 1.9 oz (75.8 kg)  ?  ? ?GEN: Patient is in no acute distress ?HEENT: Normal ?NECK: No JVD; No carotid bruits ?LYMPHATICS: No  lymphadenopathy ?CARDIAC: Hear sounds regular, 2/6 systolic murmur at the apex. ?RESPIRATORY:  Clear to auscultation without rales, wheezing or rhonchi  ?ABDOMEN: Soft, non-tender, non-distended ?MUSCULOSKELETAL:  No edema; No deformity  ?SKIN: Warm and dry ?NEUROLOGIC:  Alert and oriented x 3 ?PSYCHIATRIC:  Normal affect  ? ?Signed, ?Jenean Lindau, MD  ?01/21/2022 3:05 PM    ?[place]  ?

## 2022-02-03 ENCOUNTER — Encounter: Payer: Self-pay | Admitting: Dermatology

## 2022-02-03 ENCOUNTER — Ambulatory Visit: Payer: 59 | Admitting: Dermatology

## 2022-02-03 DIAGNOSIS — L738 Other specified follicular disorders: Secondary | ICD-10-CM

## 2022-02-03 DIAGNOSIS — Z1283 Encounter for screening for malignant neoplasm of skin: Secondary | ICD-10-CM | POA: Diagnosis not present

## 2022-02-03 DIAGNOSIS — B351 Tinea unguium: Secondary | ICD-10-CM

## 2022-02-03 NOTE — Patient Instructions (Addendum)
Upper back lesion keratosis safe to leave  ?

## 2022-02-07 LAB — HM DIABETES EYE EXAM

## 2022-02-20 ENCOUNTER — Encounter: Payer: Self-pay | Admitting: Dermatology

## 2022-02-20 NOTE — Progress Notes (Signed)
? ?  Follow-Up Visit ?  ?Subjective  ?[name] [name] is a 56 y.o. male who presents for the following: Annual Exam (No new concerns, wife circled a lesion on the upper back ). ? ?Annual skin check, wife noticed change in spot on upper back which she circled. ?Location:  ?Duration:  ?Quality:  ?Associated Signs/Symptoms: ?Modifying Factors:  ?Severity:  ?Timing: ?Context:  ? ?Objective  ?Well appearing patient in no apparent distress; mood and affect are within normal limits. ?General skin examination.  Angiomas on the torso and the scalp, keratosis on the back wife circled (textbook dermoscopy).  No atypical nevi or signs of NMSC noted at the time of the visit.  ? ?Mid Forehead ?Several 2 mm flesh-colored papules, some with eccentric dell.  Typical dermoscopy. ? ?Left Hallux Toe Nail Plate ?Toenail fungus still present in previous chart from years ago  ? ? ? ?A full examination was performed including scalp, head, eyes, ears, nose, lips, neck, chest, axillae, abdomen, back, buttocks, bilateral upper extremities, bilateral lower extremities, hands, feet, fingers, toes, fingernails, and toenails. All findings within normal limits unless otherwise noted below. ? ? ?Assessment & Plan  ? ? ?Screening exam for skin cancer ? ?Keep yearly skin checks, continue to encourage self examination with spouse twice annually.  Continued ultraviolet protection. ? ?Sebaceous hyperplasia of face ?Mid Forehead ? ?Told of similar appearance of early [facility] so if any lesion grows or bleeds return for biopsy ? ?Toenail fungus ?Left Hallux Toe Nail Plate ? ? ? ? ? ?I, Lavonna Monarch, MD, have reviewed all documentation for this visit.  The documentation on 02/20/22 for the exam, diagnosis, procedures, and orders are all accurate and complete. ?

## 2022-03-07 ENCOUNTER — Other Ambulatory Visit: Payer: Self-pay | Admitting: Family Medicine

## 2022-03-11 ENCOUNTER — Ambulatory Visit: Payer: 59 | Admitting: Family Medicine

## 2022-03-11 ENCOUNTER — Encounter: Payer: Self-pay | Admitting: Family Medicine

## 2022-03-11 VITALS — BP 136/70 | HR 71 | Temp 97.6°F | Ht 67.0 in | Wt 165.0 lb

## 2022-03-11 DIAGNOSIS — W57XXXA Bitten or stung by nonvenomous insect and other nonvenomous arthropods, initial encounter: Secondary | ICD-10-CM | POA: Diagnosis not present

## 2022-03-11 DIAGNOSIS — Z8679 Personal history of other diseases of the circulatory system: Secondary | ICD-10-CM | POA: Diagnosis not present

## 2022-03-11 DIAGNOSIS — S80862A Insect bite (nonvenomous), left lower leg, initial encounter: Secondary | ICD-10-CM

## 2022-03-11 DIAGNOSIS — S30861A Insect bite (nonvenomous) of abdominal wall, initial encounter: Secondary | ICD-10-CM | POA: Diagnosis not present

## 2022-03-11 MED ORDER — DOXYCYCLINE HYCLATE 100 MG PO TABS: 100.0000 mg | ORAL_TABLET | Freq: Two times a day (BID) | ORAL | 0 refills | Status: AC

## 2022-03-11 MED ORDER — COLCHICINE 0.6 MG PO TABS: 0.6000 mg | ORAL_TABLET | Freq: Every day | ORAL | 4 refills | Status: DC

## 2022-03-11 NOTE — Progress Notes (Signed)
Established Patient Office Visit  Subjective   Patient ID: [name] [name], male    DOB: 03/25/1966  Age: 56 y.o. MRN: [id number]  Chief Complaint  Patient presents with   Insect Bite    Tick bite few weeks ago still have a rash around navel.     HPI follow-up of recent tick bites.  Status post deer tick bite to his left posterior knee on 4/28.  A scab remains but there is no rash.  He found another tick in his umbilicus 2 weeks ago.  It was a large tick.  There is now an expanding rash from his umbilicus.  Over the last few weeks he has experienced headaches and joint pains.  There was dyspnea on exertion similar to prior episodes of pericarditis.  He took a round of colchicine and this resolved.  Status post diabetic eye check without retinopathy on 4/28.    Review of Systems  Constitutional:  Positive for fever. Negative for chills, diaphoresis, malaise/fatigue and weight loss.  HENT: Negative.    Eyes:  Negative for blurred vision, discharge and redness.  Respiratory: Negative.    Cardiovascular: Negative.   Gastrointestinal:  Negative for abdominal pain.  Genitourinary: Negative.   Musculoskeletal:  Positive for joint pain. Negative for myalgias.  Skin:  Positive for rash.  Neurological:  Positive for headaches. Negative for tingling, loss of consciousness and weakness.  Endo/Heme/Allergies:  Negative for polydipsia.     Objective:     BP 136/70 (BP Location: Right Arm, Patient Position: Sitting, Cuff Size: Normal)   Pulse 71   Temp 97.6 F (36.4 C) (Temporal)   Ht '5\' 7"'$  (1.702 m)   Wt 165 lb (74.8 kg)   SpO2 97%   BMI 25.84 kg/m    Physical Exam Constitutional:      General: He is not in acute distress.    Appearance: Normal appearance. He is not ill-appearing, toxic-appearing or diaphoretic.  HENT:     Head: Normocephalic and atraumatic.     Right Ear: External ear normal.     Left Ear: External ear normal.     Mouth/Throat:     Mouth: Mucous membranes are  moist.     Pharynx: Oropharynx is clear. No oropharyngeal exudate or posterior oropharyngeal erythema.  Eyes:     General: No scleral icterus.       Right eye: No discharge.        Left eye: No discharge.     Extraocular Movements: Extraocular movements intact.     Conjunctiva/sclera: Conjunctivae normal.     Pupils: Pupils are equal, round, and reactive to light.  Cardiovascular:     Rate and Rhythm: Normal rate and regular rhythm.  Pulmonary:     Effort: Pulmonary effort is normal. No respiratory distress.     Breath sounds: Normal breath sounds.  Abdominal:     General: Bowel sounds are normal.  Musculoskeletal:     Cervical back: No rigidity or tenderness.  Skin:    General: Skin is warm and dry.     Comments: Just below the umbilicus there is a zone of erythema that is fan-shaped.  It extends approximately 4 cms.  Lateral left posterior knee with small 2 mm scab without zone of erythema.  There is no necrosis.   Neurological:     Mental Status: He is alert and oriented to person, place, and time.  Psychiatric:        Mood and Affect: Mood normal.  Behavior: Behavior normal.     Results for orders placed or performed in visit on 03/11/22  CBC  Result Value Ref Range   WBC 5.8 4.0 - 10.5 K/uL   RBC 4.73 4.22 - 5.81 Mil/uL   Platelets 366.0 150.0 - 400.0 K/uL   Hemoglobin 13.3 13.0 - 17.0 g/dL   HCT 39.5 39.0 - 52.0 %   MCV 83.4 78.0 - 100.0 fl   MCHC 33.6 30.0 - 36.0 g/dL   RDW 14.1 11.5 - 15.5 %  Lyme Disease Serology w/Reflex  Result Value Ref Range   Lyme Total Antibody EIA Negative Negative      The 10-year ASCVD risk score (Arnett DK, et al., 2019) is: 11%    Assessment & Plan:   Problem List Items Addressed This Visit       Musculoskeletal and Integument   Tick bite of left lower leg - Primary   Relevant Medications   doxycycline (VIBRA-TABS) 100 MG tablet   Other Relevant Orders   CBC (Completed)   Lyme Disease Serology w/Reflex  (Completed)     Other   History of pericarditis   Relevant Medications   colchicine 0.6 MG tablet    Return if symptoms worsen or fail to improve.  10 days of doxycycline with sun precautions given.  Refills of colchicine given.  Checking CBC, Lyme's and [facility] spotted fever titers.  Libby Maw, MD

## 2022-03-12 LAB — CBC
HCT: 39.5 % (ref 39.0–52.0)
Hemoglobin: 13.3 g/dL (ref 13.0–17.0)
MCHC: 33.6 g/dL (ref 30.0–36.0)
MCV: 83.4 fl (ref 78.0–100.0)
Platelets: 366 10*3/uL (ref 150.0–400.0)
RBC: 4.73 Mil/uL (ref 4.22–5.81)
RDW: 14.1 % (ref 11.5–15.5)
WBC: 5.8 10*3/uL (ref 4.0–10.5)

## 2022-03-12 LAB — LYME DISEASE SEROLOGY W/REFLEX: Lyme Total Antibody EIA: NEGATIVE

## 2022-03-17 LAB — ROCKY MTN SPOTTED FVR ABS PNL(IGG+IGM)
RMSF IgG: NOT DETECTED
RMSF IgM: NOT DETECTED

## 2022-04-07 ENCOUNTER — Other Ambulatory Visit: Payer: Self-pay | Admitting: Family Medicine

## 2022-04-07 DIAGNOSIS — Z8679 Personal history of other diseases of the circulatory system: Secondary | ICD-10-CM

## 2022-04-07 MED ORDER — COLCHICINE 0.6 MG PO TABS: 0.6000 mg | ORAL_TABLET | Freq: Every day | ORAL | 1 refills | Status: DC

## 2022-04-08 ENCOUNTER — Encounter: Payer: Self-pay | Admitting: Family Medicine

## 2022-04-13 ENCOUNTER — Other Ambulatory Visit: Payer: Self-pay | Admitting: Family Medicine

## 2022-05-08 ENCOUNTER — Other Ambulatory Visit: Payer: Self-pay | Admitting: Family Medicine

## 2022-05-08 DIAGNOSIS — Z8679 Personal history of other diseases of the circulatory system: Secondary | ICD-10-CM

## 2022-05-16 ENCOUNTER — Encounter: Payer: Self-pay | Admitting: Family Medicine

## 2022-05-16 ENCOUNTER — Ambulatory Visit: Payer: 59 | Admitting: Family Medicine

## 2022-05-16 VITALS — BP 138/70 | HR 68 | Temp 97.5°F | Ht 67.0 in | Wt 161.0 lb

## 2022-05-16 DIAGNOSIS — I319 Disease of pericardium, unspecified: Secondary | ICD-10-CM | POA: Diagnosis not present

## 2022-05-16 DIAGNOSIS — E1165 Type 2 diabetes mellitus with hyperglycemia: Secondary | ICD-10-CM

## 2022-05-16 DIAGNOSIS — Z23 Encounter for immunization: Secondary | ICD-10-CM | POA: Diagnosis not present

## 2022-05-16 DIAGNOSIS — Z8679 Personal history of other diseases of the circulatory system: Secondary | ICD-10-CM | POA: Diagnosis not present

## 2022-05-16 DIAGNOSIS — E7800 Pure hypercholesterolemia, unspecified: Secondary | ICD-10-CM | POA: Diagnosis not present

## 2022-05-16 LAB — BASIC METABOLIC PANEL
BUN: 13 mg/dL (ref 6–23)
CO2: 30 mEq/L (ref 19–32)
Calcium: 9.6 mg/dL (ref 8.4–10.5)
Chloride: 101 mEq/L (ref 96–112)
Creatinine, Ser: 0.86 mg/dL (ref 0.40–1.50)
GFR: 96.36 mL/min (ref >60.00)
Glucose, Bld: 147 mg/dL — ABNORMAL HIGH (ref 70–99)
Potassium: 4.6 mEq/L (ref 3.5–5.1)
Sodium: 136 mEq/L (ref 135–145)

## 2022-05-16 LAB — HEMOGLOBIN A1C: Hgb A1c MFr Bld: 7.2 % — ABNORMAL HIGH (ref 4.6–6.5)

## 2022-05-16 MED ORDER — FREESTYLE LIBRE 2 SENSOR MISC: 1.0000 [IU] | 3 refills | Status: DC

## 2022-05-16 MED ORDER — RYBELSUS 14 MG PO TABS: 14.0000 mg | ORAL_TABLET | Freq: Every day | ORAL | 1 refills | Status: DC

## 2022-05-16 MED ORDER — COLCHICINE 0.6 MG PO TABS: 0.6000 mg | ORAL_TABLET | Freq: Every day | ORAL | 0 refills | Status: DC

## 2022-05-16 MED ORDER — FREESTYLE FREEDOM KIT: PACK | 0 refills | Status: DC

## 2022-05-16 MED ORDER — COLCHICINE 0.6 MG PO TABS: 0.6000 mg | ORAL_TABLET | Freq: Every day | ORAL | 3 refills | Status: DC

## 2022-05-16 NOTE — Addendum Note (Signed)
Addended by: Jon Billings on: 05/16/2022 05:22 PM   Modules accepted: Orders

## 2022-05-16 NOTE — Progress Notes (Addendum)
Established Patient Office Visit  Subjective   Patient ID: [name] [name], male    DOB: 04/08/1966  Age: 56 y.o. MRN: [id number]  Chief Complaint  Patient presents with   Follow-up    6 month follow up discuss starting libre, patient fasting.     HPI has done well with Rybelsus and metformin for diabetes control.  Hopes to have an hemoglobin A1c under 7 this visit.  Has been able to lose some weight.  Appetite has responded to Rybelsus.  Blood pressure controlled with lisinopril.  Unfortunately he does not feel as though he is done well with as needed colchicine for his chronic pericarditis and would like to start taking it again, if not every day every other day.  By this time he knows when his pericarditis is flaring.  Flares respond when he restarts colchicine.  He is interested in the [employer].    Review of Systems  Constitutional: Negative.   HENT: Negative.    Eyes:  Negative for blurred vision, discharge and redness.  Respiratory: Negative.    Cardiovascular: Negative.   Gastrointestinal:  Negative for abdominal pain.  Genitourinary: Negative.   Musculoskeletal: Negative.  Negative for myalgias.  Skin:  Negative for rash.  Neurological:  Negative for tingling, loss of consciousness and weakness.  Endo/Heme/Allergies:  Negative for polydipsia.      Objective:     BP 138/70 (BP Location: Left Arm, Patient Position: Sitting, Cuff Size: Normal)   Pulse 68   Temp (!) 97.5 F (36.4 C) (Temporal)   Ht 5' 7"  (1.702 m)   Wt 161 lb (73 kg)   SpO2 98%   BMI 25.22 kg/m  BP Readings from Last 3 Encounters:  05/16/22 138/70  03/11/22 136/70  01/21/22 136/74   Wt Readings from Last 3 Encounters:  05/16/22 161 lb (73 kg)  03/11/22 165 lb (74.8 kg)  01/21/22 166 lb (75.3 kg)      Physical Exam Constitutional:      General: He is not in acute distress.    Appearance: Normal appearance. He is not ill-appearing, toxic-appearing or diaphoretic.  HENT:     Head:  Normocephalic and atraumatic.     Right Ear: External ear normal.     Left Ear: External ear normal.     Mouth/Throat:     Mouth: Mucous membranes are moist.     Pharynx: Oropharynx is clear. No oropharyngeal exudate or posterior oropharyngeal erythema.  Eyes:     General: No visual field deficit or scleral icterus.       Right eye: No discharge.        Left eye: No discharge.     Extraocular Movements: Extraocular movements intact.     Conjunctiva/sclera: Conjunctivae normal.     Pupils: Pupils are equal, round, and reactive to light.  Cardiovascular:     Rate and Rhythm: Normal rate and regular rhythm.  Pulmonary:     Effort: Pulmonary effort is normal. No respiratory distress.     Breath sounds: Normal breath sounds.  Abdominal:     General: Bowel sounds are normal.  Musculoskeletal:     Cervical back: No rigidity or tenderness.  Skin:    General: Skin is warm and dry.  Neurological:     Mental Status: He is alert and oriented to person, place, and time.     Cranial Nerves: No dysarthria or facial asymmetry.  Psychiatric:        Mood and Affect: Mood normal.  Behavior: Behavior normal.      Results for orders placed or performed in visit on 05/16/22  Hemoglobin A1c  Result Value Ref Range   Hgb A1c MFr Bld 7.2 (H) 4.6 - 6.5 %  Basic metabolic panel  Result Value Ref Range   Sodium 136 135 - 145 mEq/L   Potassium 4.6 3.5 - 5.1 mEq/L   Chloride 101 96 - 112 mEq/L   CO2 30 19 - 32 mEq/L   Glucose, Bld 147 (H) 70 - 99 mg/dL   BUN 13 6 - 23 mg/dL   Creatinine, Ser 0.86 0.40 - 1.50 mg/dL   GFR 96.36 >60.00 mL/min   Calcium 9.6 8.4 - 10.5 mg/dL      The 10-year ASCVD risk score (Arnett DK, et al., 2019) is: 12.5%    Assessment & Plan:   Problem List Items Addressed This Visit       Cardiovascular and Mediastinum   Chronic pericarditis   Relevant Medications   colchicine 0.6 MG tablet     Endocrine   Type 2 diabetes mellitus with hyperglycemia,  without long-term current use of insulin (HCC)   Relevant Medications   Blood Glucose Monitoring Suppl (FREESTYLE FREEDOM) KIT   Continuous Blood Gluc Sensor (FREESTYLE LIBRE 2 SENSOR) MISC   Semaglutide (RYBELSUS) 14 MG TABS   Other Relevant Orders   Hemoglobin A1c (Completed)   Basic metabolic panel (Completed)     Other   History of pericarditis   Elevated cholesterol   Need for shingles vaccine - Primary   Relevant Orders   Varicella-zoster vaccine IM (Completed)    Return in about 6 months (around 11/16/2022).  We will start colchicine back in every day or every other day as needed response.  We discussed the interaction of colchicine and atorvastatin.  He will be on the look out for any unusual muscle aches or pains and let me know.  Libby Maw, MD  Have increased Rybelsus to 14 mg daily.

## 2022-05-19 ENCOUNTER — Telehealth: Payer: Self-pay | Admitting: Family Medicine

## 2022-05-19 DIAGNOSIS — E1165 Type 2 diabetes mellitus with hyperglycemia: Secondary | ICD-10-CM

## 2022-05-19 MED ORDER — DEXCOM G7 SENSOR MISC: 1.0000 | 0 refills | Status: DC

## 2022-05-19 NOTE — Telephone Encounter (Signed)
Requested Rx sent to pharmacy.

## 2022-05-19 NOTE — Telephone Encounter (Signed)
Caller Name: Kellin Bartling Call back phone #: [phone]  Reason for Call: Insurance will not cover the prescribed Freestyle Freedom blood monitoring kit. Pt asked if he could instead be prescribed Dexcom G7

## 2022-05-26 ENCOUNTER — Other Ambulatory Visit: Payer: Self-pay

## 2022-05-26 DIAGNOSIS — E1165 Type 2 diabetes mellitus with hyperglycemia: Secondary | ICD-10-CM

## 2022-05-26 MED ORDER — DEXCOM G7 SENSOR MISC: 1.0000 [IU] | 3 refills | Status: DC

## 2022-06-07 ENCOUNTER — Other Ambulatory Visit: Payer: Self-pay | Admitting: Family Medicine

## 2022-06-09 ENCOUNTER — Other Ambulatory Visit: Payer: Self-pay | Admitting: Family Medicine

## 2022-06-09 ENCOUNTER — Encounter: Payer: Self-pay | Admitting: Family Medicine

## 2022-06-09 DIAGNOSIS — E1165 Type 2 diabetes mellitus with hyperglycemia: Secondary | ICD-10-CM

## 2022-06-10 ENCOUNTER — Other Ambulatory Visit: Payer: Self-pay

## 2022-06-10 DIAGNOSIS — Z1159 Encounter for screening for other viral diseases: Secondary | ICD-10-CM

## 2022-06-19 ENCOUNTER — Encounter: Payer: 59 | Attending: Family Medicine | Admitting: Dietician

## 2022-06-19 ENCOUNTER — Encounter: Payer: Self-pay | Admitting: Dietician

## 2022-06-19 DIAGNOSIS — E1165 Type 2 diabetes mellitus with hyperglycemia: Secondary | ICD-10-CM | POA: Insufficient documentation

## 2022-06-19 NOTE — Progress Notes (Signed)
Diabetes Self-Management Education  Visit Type: First/Initial  Appt. Start Time: 1400 Appt. End Time: 4259  06/19/2022  [name], identified by name and date of birth, is a 56 y.o. male with a diagnosis of Diabetes: Type 2.   ASSESSMENT  Patient is here today alone. Primary Concern: pt wants to get his A1C down and wants to reduce his diabetes medications.   History includes: allergies, arthritis, heart murmur, diabetes, HLD, HTN, kidney disease Labs noted:  A1C 7.2% 05/2022 Medications include:  metformin '1000mg'$  2x/day, semaglutide '14mg'$  Supplements: omega 3's, MVI  Anthropometrics: Ht: 68in Wt: 160lbs  Patient lives with with wife. Pt and his wife do shopping and wife cooks but he does some cooking. They tend to eat out 3 nights per week. Pt works 3-4 days per week and usually eats out for lunch.   Pt states he used to be more active. He gets 15000 steps at work but his A1C used to be 6.7 in 2020 and he states he was working out at [employer]. He is considering starting going back to the gym.   Pt has Dexcom G6.    Height '5\' 8"'$  (1.727 m), weight 160 lb (72.6 kg). Body mass index is 24.33 kg/m.   Diabetes Self-Management Education - 06/19/22 1406       Visit Information   Visit Type First/Initial      Initial Visit   Diabetes Type Type 2    Date Diagnosed 2015    Are you currently following a meal plan? No    Are you taking your medications as prescribed? Yes      Health Coping   How would you rate your overall health? Fair      Psychosocial Assessment   Patient Belief/Attitude about Diabetes Motivated to manage diabetes    What is the hardest part about your diabetes right now, causing you the most concern, or is the most worrisome to you about your diabetes?   Making healty food and beverage choices;Being active    Self-care barriers None    Self-management support Doctor's office    Other persons present Patient    Patient Concerns Nutrition/Meal  planning;Healthy Lifestyle    Special Needs None    Learning Readiness Ready    How often do you need to have someone help you when you read instructions, pamphlets, or other written materials from your doctor or pharmacy? 1 - Never    What is the last grade level you completed in school? 12th      Pre-Education Assessment   Patient understands the diabetes disease and treatment process. Needs Instruction    Patient understands incorporating nutritional management into lifestyle. Needs Instruction    Patient undertands incorporating physical activity into lifestyle. Needs Instruction    Patient understands using medications safely. Needs Instruction    Patient understands monitoring blood glucose, interpreting and using results Needs Instruction    Patient understands prevention, detection, and treatment of acute complications. Needs Instruction    Patient understands prevention, detection, and treatment of chronic complications. Needs Instruction    Patient understands how to develop strategies to address psychosocial issues. Needs Instruction    Patient understands how to develop strategies to promote health/change behavior. Needs Instruction      Complications   Last HgB A1C per patient/outside source 7.3 %   05/2022   How often do you check your blood sugar? > 4 times/day    Fasting Blood glucose range (mg/dL) 70-129  Postprandial Blood glucose range (mg/dL) 130-179    Have you had a dilated eye exam in the past 12 months? Yes    Have you had a dental exam in the past 12 months? Yes    Are you checking your feet? Yes    How many days per week are you checking your feet? 7      Dietary Intake   Breakfast if at home cereal (cheerios or raisin bran) OR oatmeal (low sugar flavored) OR egg and meat biscuit out 1x/wk OR poptarts    Snack (morning) zero sugar greek yogurt    Lunch [employer]: corn, lima beans and potatoes OR taco bell burrito and taco OR half a peanut butter sandwich on white  bread    Snack (afternoon) apple OR banana OR protein bar    Dinner fish and salad OR spaghetti w/ or w/o meat sauce OR chickfila deluxe and fries or salad    Snack (evening) ice cream OR cookies    Beverage(s) coffee, unsweet tea, occasional diet soda, 48-64oz water      Activity / Exercise   Activity / Exercise Type Light (walking / raking leaves)   15000 steps at work   How many days per week do you exercise? 4    How many minutes per day do you exercise? 30    Total minutes per week of exercise 120      Patient Education   Previous Diabetes Education No    Disease Pathophysiology Definition of diabetes, type 1 and 2, and the diagnosis of diabetes;Factors that contribute to the development of diabetes;Explored patient's options for treatment of their diabetes    Healthy Eating Role of diet in the treatment of diabetes and the relationship between the three main macronutrients and blood glucose level;Food label reading, portion sizes and measuring food.;Plate Method;Reviewed blood glucose goals for pre and post meals and how to evaluate the patients' food intake on their blood glucose level.;Meal timing in regards to the patients' current diabetes medication.;Carbohydrate counting;Effects of alcohol on blood glucose and safety factors with consumption of alcohol.;Information on hints to eating out and maintain blood glucose control.    Being Active Role of exercise on diabetes management, blood pressure control and cardiac health.;Helped patient identify appropriate exercises in relation to his/her diabetes, diabetes complications and other health issue.    Medications Reviewed patients medication for diabetes, action, purpose, timing of dose and side effects.;Reviewed medication adjustment guidelines for hyperglycemia and sick days.    Monitoring Identified appropriate SMBG and/or A1C goals.;Daily foot exams;Yearly dilated eye exam    Acute complications Taught prevention, symptoms, and   treatment of hypoglycemia - the 15 rule.;Discussed and identified patients' prevention, symptoms, and treatment of hyperglycemia.    Chronic complications Dental care;Relationship between chronic complications and blood glucose control;Assessed and discussed foot care and prevention of foot problems;Identified and discussed with patient  current chronic complications;Retinopathy and reason for yearly dilated eye exams;Nephropathy, what it is, prevention of, the use of ACE, ARB's and early detection of through urine microalbumia.;Reviewed with patient heart disease, higher risk of, and prevention    Diabetes Stress and Support Identified and addressed patients feelings and concerns about diabetes;Role of stress on diabetes    Lifestyle and Health Coping Lifestyle issues that need to be addressed for better diabetes care      Individualized Goals (developed by patient)   Nutrition Follow meal plan discussed;General guidelines for healthy choices and portions discussed    Physical Activity Exercise 5-7  days per week;30 minutes per day    Medications take my medication as prescribed    Monitoring  Consistenly use CGM    Problem Solving Eating Pattern    Reducing Risk examine blood glucose patterns;do foot checks daily;treat hypoglycemia with 15 grams of carbs if blood glucose less than '70mg'$ /dL      Post-Education Assessment   Patient understands the diabetes disease and treatment process. Comprehends key points    Patient understands incorporating nutritional management into lifestyle. Comprehends key points    Patient undertands incorporating physical activity into lifestyle. Comprehends key points    Patient understands using medications safely. Comphrehends key points    Patient understands monitoring blood glucose, interpreting and using results Comprehends key points    Patient understands prevention, detection, and treatment of acute complications. Comprehends key points    Patient understands  prevention, detection, and treatment of chronic complications. Comprehends key points    Patient understands how to develop strategies to address psychosocial issues. Comprehends key points    Patient understands how to develop strategies to promote health/change behavior. Comprehends key points      Outcomes   Expected Outcomes Demonstrated interest in learning. Expect positive outcomes    Future DMSE PRN    Program Status Completed             Individualized Plan for Diabetes Self-Management Training:   Learning Objective:  Patient will have a greater understanding of diabetes self-management. Patient education plan is to attend individual and/or group sessions per assessed needs and concerns.   Plan:   Patient Instructions  Plan:  Aim for 3-4 Carb Choices per meal (45-60 grams) +/- 1 either way  Aim for 0-15 Carbs per snack if hungry  Include protein in moderation with your meals and snacks Consider reading food labels for Total Carbohydrate of foods  Continue using Dexcom CGM.  Continue taking medication as directed by MD.  Aim to get 150 minutes of physical activity weekly.  Increase water intake to at least 64oz.   Eat more Non-Starchy Vegetables  Minimize added sugars and refined grains Rethink what you drink. Choose beverages without added sugar. Look for 0 carbs on the label. Consider having fruit for dessert rather than ice cream/cookies. Choose whole wheat and whole grains.   Choose whole foods over processed.  Make simple meals at home more often than eating out.       Expected Outcomes:  Demonstrated interest in learning. Expect positive outcomes  Education material provided: ADA - How to Thrive: A Guide for Your Journey with Diabetes, Meal plan card, My Plate, and Snack sheet, Dining Out the [facility].  If problems or questions, patient to contact team via:  Phone  Future DSME appointment: PRN

## 2022-06-19 NOTE — Patient Instructions (Signed)
Plan:  Aim for 3-4 Carb Choices per meal (45-60 grams) +/- 1 either way  Aim for 0-15 Carbs per snack if hungry  Include protein in moderation with your meals and snacks Consider reading food labels for Total Carbohydrate of foods  Continue using Dexcom CGM.  Continue taking medication as directed by MD.  Aim to get 150 minutes of physical activity weekly.  Increase water intake to at least 64oz.   Eat more Non-Starchy Vegetables  Minimize added sugars and refined grains Rethink what you drink. Choose beverages without added sugar. Look for 0 carbs on the label. Consider having fruit for dessert rather than ice cream/cookies. Choose whole wheat and whole grains.   Choose whole foods over processed.  Make simple meals at home more often than eating out.

## 2022-08-19 ENCOUNTER — Ambulatory Visit: Payer: 59 | Admitting: Family Medicine

## 2022-08-19 ENCOUNTER — Encounter: Payer: Self-pay | Admitting: Family Medicine

## 2022-08-19 VITALS — BP 140/82 | HR 75 | Temp 97.3°F | Ht 68.0 in | Wt 157.0 lb

## 2022-08-19 DIAGNOSIS — F418 Other specified anxiety disorders: Secondary | ICD-10-CM

## 2022-08-19 DIAGNOSIS — R6882 Decreased libido: Secondary | ICD-10-CM

## 2022-08-19 DIAGNOSIS — E1165 Type 2 diabetes mellitus with hyperglycemia: Secondary | ICD-10-CM | POA: Diagnosis not present

## 2022-08-19 DIAGNOSIS — Z23 Encounter for immunization: Secondary | ICD-10-CM | POA: Diagnosis not present

## 2022-08-19 DIAGNOSIS — N5201 Erectile dysfunction due to arterial insufficiency: Secondary | ICD-10-CM

## 2022-08-19 DIAGNOSIS — Z125 Encounter for screening for malignant neoplasm of prostate: Secondary | ICD-10-CM | POA: Diagnosis not present

## 2022-08-19 LAB — COMPREHENSIVE METABOLIC PANEL
ALT: 21 U/L (ref 0–53)
AST: 12 U/L (ref 0–37)
Albumin: 4.6 g/dL (ref 3.5–5.2)
Alkaline Phosphatase: 84 U/L (ref 39–117)
BUN: 11 mg/dL (ref 6–23)
CO2: 28 mEq/L (ref 19–32)
Calcium: 10 mg/dL (ref 8.4–10.5)
Chloride: 102 mEq/L (ref 96–112)
Creatinine, Ser: 0.77 mg/dL (ref 0.40–1.50)
GFR: 99.45 mL/min (ref >60.00)
Glucose, Bld: 165 mg/dL — ABNORMAL HIGH (ref 70–99)
Potassium: 4.1 mEq/L (ref 3.5–5.1)
Sodium: 138 mEq/L (ref 135–145)
Total Bilirubin: 0.5 mg/dL (ref 0.2–1.2)
Total Protein: 6.9 g/dL (ref 6.0–8.3)

## 2022-08-19 LAB — HEMOGLOBIN A1C: Hgb A1c MFr Bld: 7 % — ABNORMAL HIGH (ref 4.6–6.5)

## 2022-08-19 LAB — PSA: PSA: 1.21 ng/mL (ref 0.10–4.00)

## 2022-08-19 MED ORDER — SERTRALINE HCL 50 MG PO TABS: 50.0000 mg | ORAL_TABLET | Freq: Every day | ORAL | 3 refills | Status: DC

## 2022-08-19 MED ORDER — DEXCOM G7 SENSOR MISC: 1.0000 [IU] | 3 refills | Status: DC

## 2022-08-19 NOTE — Progress Notes (Signed)
Established Patient Office Visit  Subjective   Patient ID: [name] [name], male    DOB: Aug 19, 1966  Age: 56 y.o. MRN: [id number]  Chief Complaint  Patient presents with   Acute Visit    Pt would like to be check for Hep C Testosterone and would like flu vaccine.    HPI follow-up of type 2 diabetes status post increasing Rybelsus to 14 mg daily.  Tolerating this well.  Has noticed a drop in his libido.  There is some issue with ed. he has been irritable and anxious over the last several months due to stress at work.    Review of Systems  Constitutional: Negative.   HENT: Negative.    Eyes:  Negative for blurred vision, discharge and redness.  Respiratory: Negative.    Cardiovascular: Negative.   Gastrointestinal:  Negative for abdominal pain.  Genitourinary: Negative.  Negative for dysuria, frequency, hematuria and urgency.  Musculoskeletal: Negative.  Negative for myalgias.  Skin:  Negative for rash.  Neurological:  Negative for tingling, loss of consciousness and weakness.  Endo/Heme/Allergies:  Negative for polydipsia.      08/19/2022   10:45 AM 08/19/2022   10:06 AM 06/19/2022    2:02 PM  Depression screen PHQ 2/9  Decreased Interest 1 0 0  Down, Depressed, Hopeless 1 0 0  PHQ - 2 Score 2 0 0  Altered sleeping 2    Tired, decreased energy 2    Change in appetite 1    Feeling bad or failure about yourself  2    Trouble concentrating 2    Moving slowly or fidgety/restless 2    Suicidal thoughts 0    PHQ-9 Score 13    Difficult doing work/chores Somewhat difficult          Objective:     BP (!) 140/82 (BP Location: Right Arm, Patient Position: Sitting, Cuff Size: Normal)   Pulse 75   Temp (!) 97.3 F (36.3 C) (Temporal)   Ht '5\' 8"'$  (1.727 m)   Wt 157 lb (71.2 kg)   SpO2 98%   BMI 23.87 kg/m    Physical Exam Constitutional:      General: He is not in acute distress.    Appearance: Normal appearance. He is not ill-appearing, toxic-appearing or diaphoretic.   HENT:     Head: Normocephalic and atraumatic.     Right Ear: External ear normal.     Left Ear: External ear normal.  Eyes:     General: No scleral icterus.       Right eye: No discharge.        Left eye: No discharge.     Extraocular Movements: Extraocular movements intact.     Conjunctiva/sclera: Conjunctivae normal.  Cardiovascular:     Rate and Rhythm: Normal rate and regular rhythm.  Pulmonary:     Effort: Pulmonary effort is normal. No respiratory distress.     Breath sounds: Normal breath sounds.  Musculoskeletal:     Cervical back: No rigidity.  Skin:    General: Skin is warm and dry.  Neurological:     Mental Status: He is alert and oriented to person, place, and time.  Psychiatric:        Mood and Affect: Mood normal.        Behavior: Behavior normal.      No results found for any visits on 08/19/22.    The 10-year ASCVD risk score (Arnett DK, et al., 2019) is: 12.8%    Assessment &  Plan:   Problem List Items Addressed This Visit       Endocrine   Type 2 diabetes mellitus with hyperglycemia, without long-term current use of insulin (HCC) - Primary   Relevant Medications   Continuous Blood Gluc Sensor (DEXCOM G7 SENSOR) MISC   Other Relevant Orders   Hemoglobin A1c   Comprehensive metabolic panel     Other   Libido, decreased   Relevant Orders   Testosterone Total,Free,Bio, Males-([facility])   Other Visit Diagnoses     Erectile dysfunction due to arterial insufficiency       Need for influenza vaccination       Relevant Orders   Flu Vaccine QUAD 6+ mos PF IM (Fluarix Quad PF)   Screening for prostate cancer       Relevant Orders   PSA   Depression with anxiety       Relevant Medications   sertraline (ZOLOFT) 50 MG tablet       No follow-ups on file.   We will start Zoloft 50 mg.  Advised that it may take a few weeks for reduction in his sense of stress and anxiety and up to another 4 weeks for mood elevation.  Checking testosterone levels.   Discussed that his lack of libido and ED for that matter could be from anxiety and depression.  PSA has not been checked in a few years.  He has no problems with urine flow. Libby Maw, MD

## 2022-08-20 LAB — TESTOSTERONE TOTAL,FREE,BIO, MALES
Albumin: 4.5 g/dL (ref 3.6–5.1)
Sex Hormone Binding: 56 nmol/L (ref 22–77)
Testosterone, Bioavailable: 104 ng/dL — ABNORMAL LOW (ref 110.0–575.0)
Testosterone, Free: 50.6 pg/mL (ref 46.0–224.0)
Testosterone: 586 ng/dL (ref 250–827)

## 2022-09-10 ENCOUNTER — Other Ambulatory Visit: Payer: Self-pay | Admitting: Family Medicine

## 2022-09-10 DIAGNOSIS — F418 Other specified anxiety disorders: Secondary | ICD-10-CM

## 2022-09-24 ENCOUNTER — Encounter: Payer: Self-pay | Admitting: Family Medicine

## 2022-09-24 ENCOUNTER — Ambulatory Visit: Payer: 59 | Admitting: Family Medicine

## 2022-09-24 VITALS — BP 142/78 | HR 65 | Temp 97.7°F | Ht 68.0 in | Wt 156.6 lb

## 2022-09-24 DIAGNOSIS — I10 Essential (primary) hypertension: Secondary | ICD-10-CM

## 2022-09-24 DIAGNOSIS — F418 Other specified anxiety disorders: Secondary | ICD-10-CM

## 2022-09-24 MED ORDER — AMLODIPINE BESYLATE 5 MG PO TABS: 5.0000 mg | ORAL_TABLET | Freq: Every day | ORAL | 1 refills | Status: DC

## 2022-09-24 NOTE — Progress Notes (Signed)
Established Patient Office Visit   Subjective:  Patient ID: [name] [name], male    DOB: [birth year]  Age: 56 y.o. MRN: [id number]  Chief Complaint  Patient presents with   Follow-up    Follow up on anxiety and medication, no concerns.     HPI Encounter Diagnoses  Name Primary?   Depression with anxiety Yes   Essential hypertension    Follow-up of depression with anxiety and hypertension.  Patient is responding well to Zoloft 18 mg.  Definitely less irritable and on edge.  He has experienced some elevation in the mood.  He is under a lot of stress at this time.  Work is stressful.  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  Blood pressure at home has been higher.  He is measuring values up in the 150s commonly.  He brought his cuff in today.  His cuff measured 148 compared to arm 142.  He has taken amlodipine in the past without issue.   Review of Systems  Constitutional: Negative.   HENT: Negative.    Eyes:  Negative for blurred vision, discharge and redness.  Respiratory: Negative.    Cardiovascular: Negative.   Gastrointestinal:  Negative for abdominal pain.  Genitourinary: Negative.   Musculoskeletal: Negative.  Negative for myalgias.  Skin:  Negative for rash.  Neurological:  Negative for tingling, loss of consciousness and weakness.  Endo/Heme/Allergies:  Negative for polydipsia.      09/24/2022   10:06 AM 08/19/2022   10:45 AM 08/19/2022   10:06 AM  Depression screen PHQ 2/9  Decreased Interest 0 1 0  Down, Depressed, Hopeless 0 1 0  PHQ - 2 Score 0 2 0  Altered sleeping  2   Tired, decreased energy  2   Change in appetite  1   Feeling bad or failure about yourself   2   Trouble concentrating  2   Moving slowly or fidgety/restless  2   Suicidal thoughts  0   PHQ-9 Score  13   Difficult doing work/chores Not difficult at all Somewhat difficult       Current Outpatient Medications:    amLODipine (NORVASC) 5 MG tablet,  Take 1 tablet (5 mg total) by mouth daily., Disp: 90 tablet, Rfl: 1   aspirin 81 MG tablet, Take 81 mg by mouth daily., Disp: , Rfl:    atorvastatin (LIPITOR) 40 MG tablet, TAKE 1 TABLET BY MOUTH AT BEDTIME, Disp: 90 tablet, Rfl: 3   calcium carbonate (TUMS - DOSED IN MG ELEMENTAL CALCIUM) 500 MG chewable tablet, Chew 1-2 tablets by mouth 2 (two) times daily as needed for indigestion or heartburn., Disp: , Rfl:    cetirizine (ZYRTEC) 10 MG tablet, Take 1 tablet (10 mg total) by mouth at bedtime., Disp: 30 tablet, Rfl: 11   Coenzyme Q10 (CO Q 10 PO), Take 1 capsule by mouth at bedtime. , Disp: , Rfl:    colchicine 0.6 MG tablet, Take 1 tablet (0.6 mg total) by mouth daily., Disp: 90 tablet, Rfl: 3   Continuous Blood Gluc Receiver ([place]) DEVI, 1 Device by Does not apply route as directed. Use to check glucose level, Disp: , Rfl:    Continuous Blood Gluc Sensor (DEXCOM G7 SENSOR) MISC, 1 Units by Does not apply route as directed. Change every 10 days., Disp: 9 each, Rfl: 3   Continuous Blood Gluc Sensor (FREESTYLE LIBRE 2 SENSOR) MISC, 1 Units by Does not apply route  every 14 (fourteen) days., Disp: 6 each, Rfl: 3   lisinopril (ZESTRIL) 40 MG tablet, TAKE 1 TABLET BY MOUTH EVERY DAY, Disp: 90 tablet, Rfl: 1   metFORMIN (GLUCOPHAGE) 1000 MG tablet, TAKE 1 TABLET BY MOUTH TWICE A DAY, Disp: 180 tablet, Rfl: 1   Multiple Vitamin (MULTIVITAMIN) tablet, Take 1 tablet by mouth daily. Reported on 12/20/2015, Disp: , Rfl:    Omega-3 Fatty Acids (OMEGA-3 FISH OIL) 1200 MG CAPS, Take 1,200 mg by mouth daily., Disp: , Rfl:    pantoprazole (PROTONIX) 40 MG tablet, Take 40 mg by mouth as needed for indigestion., Disp: , Rfl:    Semaglutide (RYBELSUS) 14 MG TABS, Take 1 tablet (14 mg total) by mouth daily., Disp: 90 tablet, Rfl: 1   sertraline (ZOLOFT) 50 MG tablet, TAKE 1 TABLET BY MOUTH EVERY DAY, Disp: 90 tablet, Rfl: 2   Objective:     BP (!) 142/78 (BP Location: Left Arm, Patient Position:  Sitting, Cuff Size: Normal)   Pulse 65   Temp 97.7 F (36.5 C) (Temporal)   Ht '5\' 8"'$  (1.727 m)   Wt 156 lb 9.6 oz (71 kg)   SpO2 100%   BMI 23.81 kg/m  BP Readings from Last 3 Encounters:  09/24/22 (!) 142/78  08/19/22 (!) 140/82  05/16/22 138/70   Wt Readings from Last 3 Encounters:  09/24/22 156 lb 9.6 oz (71 kg)  08/19/22 157 lb (71.2 kg)  06/19/22 160 lb (72.6 kg)      Physical Exam Constitutional:      General: He is not in acute distress.    Appearance: Normal appearance. He is not ill-appearing, toxic-appearing or diaphoretic.  HENT:     Head: Normocephalic and atraumatic.     Right Ear: External ear normal.     Left Ear: External ear normal.  Eyes:     General: No scleral icterus.       Right eye: No discharge.        Left eye: No discharge.     Extraocular Movements: Extraocular movements intact.     Conjunctiva/sclera: Conjunctivae normal.  Pulmonary:     Effort: Pulmonary effort is normal. No respiratory distress.  Skin:    General: Skin is warm and dry.  Neurological:     Mental Status: He is alert and oriented to person, place, and time.  Psychiatric:        Mood and Affect: Mood normal.        Behavior: Behavior normal.      No results found for any visits on 09/24/22.    The 10-year ASCVD risk score (Arnett DK, et al., 2019) is: 13.1%    Assessment & Plan:   Depression with anxiety  Essential hypertension -     amLODIPine Besylate; Take 1 tablet (5 mg total) by mouth daily.  Dispense: 90 tablet; Refill: 1    Return Has appointment already scheduled in February on the fifth..  Continue lisinopril 40 mg daily.  Have added amlodipine 5 mg daily.  Advised that it takes 6 to 8 weeks for this medication to gain its full effect.  Will continue sertraline.  It is helping.  Hopefully he will continue to respond to the sertraline and this will also help lower his blood pressure.  Information was given on managing hypertension and amlodipine.   Continue sertraline at 50 mg.  Need to be sure to draw hep C for him next visit.  Libby Maw, MD

## 2022-10-07 ENCOUNTER — Other Ambulatory Visit: Payer: Self-pay | Admitting: Family Medicine

## 2022-10-07 DIAGNOSIS — E119 Type 2 diabetes mellitus without complications: Secondary | ICD-10-CM

## 2022-10-21 ENCOUNTER — Other Ambulatory Visit: Payer: Self-pay | Admitting: Family Medicine

## 2022-10-21 DIAGNOSIS — E1165 Type 2 diabetes mellitus with hyperglycemia: Secondary | ICD-10-CM

## 2022-11-17 ENCOUNTER — Ambulatory Visit: Payer: 59 | Admitting: Family Medicine

## 2022-11-17 ENCOUNTER — Encounter: Payer: Self-pay | Admitting: Family Medicine

## 2022-11-17 VITALS — BP 124/68 | HR 71 | Temp 97.1°F | Ht 68.0 in | Wt 152.8 lb

## 2022-11-17 DIAGNOSIS — I10 Essential (primary) hypertension: Secondary | ICD-10-CM | POA: Diagnosis not present

## 2022-11-17 DIAGNOSIS — Z1159 Encounter for screening for other viral diseases: Secondary | ICD-10-CM

## 2022-11-17 DIAGNOSIS — F418 Other specified anxiety disorders: Secondary | ICD-10-CM | POA: Diagnosis not present

## 2022-11-17 DIAGNOSIS — Z1211 Encounter for screening for malignant neoplasm of colon: Secondary | ICD-10-CM

## 2022-11-17 DIAGNOSIS — I319 Disease of pericardium, unspecified: Secondary | ICD-10-CM

## 2022-11-17 DIAGNOSIS — E1165 Type 2 diabetes mellitus with hyperglycemia: Secondary | ICD-10-CM | POA: Diagnosis not present

## 2022-11-17 LAB — COMPREHENSIVE METABOLIC PANEL WITH GFR
ALT: 18 U/L (ref 0–53)
AST: 11 U/L (ref 0–37)
Albumin: 4.5 g/dL (ref 3.5–5.2)
Alkaline Phosphatase: 90 U/L (ref 39–117)
BUN: 11 mg/dL (ref 6–23)
CO2: 28 meq/L (ref 19–32)
Calcium: 9.9 mg/dL (ref 8.4–10.5)
Chloride: 102 meq/L (ref 96–112)
Creatinine, Ser: 0.84 mg/dL (ref 0.40–1.50)
GFR: 96.7 mL/min
Glucose, Bld: 130 mg/dL — ABNORMAL HIGH (ref 70–99)
Potassium: 4.6 meq/L (ref 3.5–5.1)
Sodium: 137 meq/L (ref 135–145)
Total Bilirubin: 0.5 mg/dL (ref 0.2–1.2)
Total Protein: 6.9 g/dL (ref 6.0–8.3)

## 2022-11-17 LAB — CBC
HCT: 41.4 % (ref 39.0–52.0)
Hemoglobin: 14 g/dL (ref 13.0–17.0)
MCHC: 33.9 g/dL (ref 30.0–36.0)
MCV: 85.3 fl (ref 78.0–100.0)
Platelets: 427 K/uL — ABNORMAL HIGH (ref 150.0–400.0)
RBC: 4.85 Mil/uL (ref 4.22–5.81)
RDW: 14.4 % (ref 11.5–15.5)
WBC: 9.1 K/uL (ref 4.0–10.5)

## 2022-11-17 LAB — URINALYSIS, ROUTINE W REFLEX MICROSCOPIC
Bilirubin Urine: NEGATIVE
Hgb urine dipstick: NEGATIVE
Ketones, ur: NEGATIVE
Leukocytes,Ua: NEGATIVE
Nitrite: NEGATIVE
RBC / HPF: NONE SEEN
Specific Gravity, Urine: <=1.005 — AB (ref 1.000–1.030)
Total Protein, Urine: NEGATIVE
Urine Glucose: NEGATIVE
Urobilinogen, UA: 0.2 (ref 0.0–1.0)
pH: 6 (ref 5.0–8.0)

## 2022-11-17 LAB — MICROALBUMIN / CREATININE URINE RATIO
Creatinine,U: 36.6 mg/dL
Microalb Creat Ratio: 1.9 mg/g (ref 0.0–30.0)
Microalb, Ur: <0.7 mg/dL (ref 0.0–1.9)

## 2022-11-17 LAB — HEMOGLOBIN A1C: Hgb A1c MFr Bld: 6.8 % — ABNORMAL HIGH (ref 4.6–6.5)

## 2022-11-17 NOTE — Progress Notes (Signed)
Established Patient Office Visit   Subjective:  Patient ID: [name] [name], male    DOB: [birth year]  Age: 57 y.o. MRN: [id number]  Chief Complaint  Patient presents with   Follow-up    6 month follow up, no concerns Patient fasting.     HPI Encounter Diagnoses  Name Primary?   Type 2 diabetes mellitus with hyperglycemia, without long-term current use of insulin (HCC) Yes   Depression with anxiety    Essential hypertension    Need for hepatitis C screening test    Screening for colon cancer    Chronic pericarditis, unspecified complication status, unspecified type    For follow-up of above.  Continues Rybelsus 7 mg with 1000 mg of metformin twice daily for diabetes.  Addition of 5 mg of amlodipine to lisinopril 40 mg is controlling blood pressure well.  Continues atorvastatin 40 mg for elevated cholesterol.  Sertraline is making a big difference in his mood levels.  He has increased his physical activity.   Review of Systems  Constitutional: Negative.   HENT: Negative.    Eyes:  Negative for blurred vision, discharge and redness.  Respiratory: Negative.    Cardiovascular: Negative.   Gastrointestinal:  Negative for abdominal pain.  Genitourinary: Negative.   Musculoskeletal: Negative.  Negative for myalgias.  Skin:  Negative for rash.  Neurological:  Negative for tingling, loss of consciousness and weakness.  Endo/Heme/Allergies:  Negative for polydipsia.      11/17/2022    8:39 AM 09/24/2022   10:06 AM 08/19/2022   10:45 AM  Depression screen PHQ 2/9  Decreased Interest 0 0 1  Down, Depressed, Hopeless 0 0 1  PHQ - 2 Score 0 0 2  Altered sleeping   2  Tired, decreased energy   2  Change in appetite   1  Feeling bad or failure about yourself    2  Trouble concentrating   2  Moving slowly or fidgety/restless   2  Suicidal thoughts   0  PHQ-9 Score   13  Difficult doing work/chores Not difficult at all Not difficult at all Somewhat difficult      Current  Outpatient Medications:    amLODipine (NORVASC) 5 MG tablet, Take 1 tablet (5 mg total) by mouth daily., Disp: 90 tablet, Rfl: 1   aspirin 81 MG tablet, Take 81 mg by mouth daily., Disp: , Rfl:    atorvastatin (LIPITOR) 40 MG tablet, TAKE 1 TABLET BY MOUTH EVERYDAY AT BEDTIME, Disp: 90 tablet, Rfl: 3   calcium carbonate (TUMS - DOSED IN MG ELEMENTAL CALCIUM) 500 MG chewable tablet, Chew 1-2 tablets by mouth 2 (two) times daily as needed for indigestion or heartburn., Disp: , Rfl:    cetirizine (ZYRTEC) 10 MG tablet, Take 1 tablet (10 mg total) by mouth at bedtime., Disp: 30 tablet, Rfl: 11   Coenzyme Q10 (CO Q 10 PO), Take 1 capsule by mouth at bedtime. , Disp: , Rfl:    colchicine 0.6 MG tablet, Take 1 tablet (0.6 mg total) by mouth daily., Disp: 90 tablet, Rfl: 3   Continuous Blood Gluc Receiver ([place]) DEVI, 1 Device by Does not apply route as directed. Use to check glucose level, Disp: , Rfl:    Continuous Blood Gluc Sensor (DEXCOM G7 SENSOR) MISC, 1 Units by Does not apply route as directed. Change every 10 days., Disp: 9 each, Rfl: 3   lisinopril (ZESTRIL) 40 MG tablet, TAKE 1 TABLET BY MOUTH EVERY DAY, Disp: 90 tablet, Rfl:  1   metFORMIN (GLUCOPHAGE) 1000 MG tablet, TAKE 1 TABLET BY MOUTH TWICE A DAY, Disp: 180 tablet, Rfl: 1   Multiple Vitamin (MULTIVITAMIN) tablet, Take 1 tablet by mouth daily. Reported on 12/20/2015, Disp: , Rfl:    Omega-3 Fatty Acids (OMEGA-3 FISH OIL) 1200 MG CAPS, Take 1,200 mg by mouth daily., Disp: , Rfl:    pantoprazole (PROTONIX) 40 MG tablet, Take 40 mg by mouth as needed for indigestion., Disp: , Rfl:    RYBELSUS 14 MG TABS, TAKE 1 TABLET (14 MG TOTAL) BY MOUTH DAILY, Disp: 90 tablet, Rfl: 1   sertraline (ZOLOFT) 50 MG tablet, TAKE 1 TABLET BY MOUTH EVERY DAY, Disp: 90 tablet, Rfl: 2   Objective:     BP 124/68 (BP Location: Right Arm, Patient Position: Sitting, Cuff Size: Normal)   Pulse 71   Temp (!) 97.1 F (36.2 C) (Temporal)   Ht '5\' 8"'$   (1.727 m)   Wt 152 lb 12.8 oz (69.3 kg)   SpO2 99%   BMI 23.23 kg/m  BP Readings from Last 3 Encounters:  11/17/22 124/68  09/24/22 (!) 142/78  08/19/22 (!) 140/82   Wt Readings from Last 3 Encounters:  11/17/22 152 lb 12.8 oz (69.3 kg)  09/24/22 156 lb 9.6 oz (71 kg)  08/19/22 157 lb (71.2 kg)      Physical Exam Constitutional:      General: He is not in acute distress.    Appearance: Normal appearance. He is not ill-appearing, toxic-appearing or diaphoretic.  HENT:     Head: Normocephalic and atraumatic.     Right Ear: External ear normal.     Left Ear: External ear normal.     Mouth/Throat:     Mouth: Mucous membranes are moist.     Pharynx: Oropharynx is clear. No oropharyngeal exudate or posterior oropharyngeal erythema.  Eyes:     General: No scleral icterus.       Right eye: No discharge.        Left eye: No discharge.     Extraocular Movements: Extraocular movements intact.     Conjunctiva/sclera: Conjunctivae normal.     Pupils: Pupils are equal, round, and reactive to light.  Cardiovascular:     Rate and Rhythm: Normal rate and regular rhythm.     Pulses:          Dorsalis pedis pulses are 2+ on the right side and 2+ on the left side.       Posterior tibial pulses are 2+ on the right side and 2+ on the left side.  Pulmonary:     Effort: Pulmonary effort is normal. No respiratory distress.     Breath sounds: Normal breath sounds.  Abdominal:     General: Bowel sounds are normal.     Tenderness: There is no abdominal tenderness. There is no guarding.  Musculoskeletal:     Cervical back: No rigidity or tenderness.  Skin:    General: Skin is warm and dry.  Neurological:     Mental Status: He is alert and oriented to person, place, and time.  Psychiatric:        Mood and Affect: Mood normal.        Behavior: Behavior normal.    Diabetic Foot Exam - Simple   Simple Foot Form Diabetic Foot exam was performed with the following findings: Yes 11/17/2022  9:09  AM  Visual Inspection See comments: Yes Sensation Testing Intact to touch and monofilament testing bilaterally: Yes Pulse Check Posterior  Tibialis and Dorsalis pulse intact bilaterally: Yes Comments Fever patient is laterally.  There are no lesions or ulcerations.       No results found for any visits on 11/17/22.    The 10-year ASCVD risk score (Arnett DK, et al., 2019) is: 10.4%    Assessment & Plan:   Type 2 diabetes mellitus with hyperglycemia, without long-term current use of insulin (HCC) -     Comprehensive metabolic panel -     Hemoglobin A1c -     Urinalysis, Routine w reflex microscopic -     Microalbumin / creatinine urine ratio  Depression with anxiety  Essential hypertension -     CBC -     Comprehensive metabolic panel -     Urinalysis, Routine w reflex microscopic -     Microalbumin / creatinine urine ratio  Need for hepatitis C screening test -     Hepatitis C antibody  Screening for colon cancer -     Ambulatory referral to Gastroenterology  Chronic pericarditis, unspecified complication status, unspecified type    Return in about 6 months (around 05/18/2023).  Continue all medications as above.  Libby Maw, MD

## 2022-11-18 LAB — HEPATITIS C ANTIBODY: Hepatitis C Ab: NONREACTIVE

## 2022-12-03 ENCOUNTER — Encounter: Payer: Self-pay | Admitting: Internal Medicine

## 2022-12-26 ENCOUNTER — Encounter: Payer: Self-pay | Admitting: Internal Medicine

## 2022-12-26 ENCOUNTER — Ambulatory Visit (AMBULATORY_SURGERY_CENTER): Payer: 59

## 2022-12-26 VITALS — Ht 68.0 in | Wt 148.0 lb

## 2022-12-26 DIAGNOSIS — Z86010 Personal history of colonic polyps: Secondary | ICD-10-CM

## 2022-12-26 MED ORDER — NA SULFATE-K SULFATE-MG SULF 17.5-3.13-1.6 GM/177ML PO SOLN: 1.0000 | Freq: Once | ORAL | 0 refills | Status: AC

## 2022-12-26 NOTE — Progress Notes (Signed)

## 2023-01-03 ENCOUNTER — Other Ambulatory Visit: Payer: Self-pay | Admitting: Family Medicine

## 2023-01-19 ENCOUNTER — Encounter: Payer: Self-pay | Admitting: Internal Medicine

## 2023-01-19 ENCOUNTER — Ambulatory Visit (AMBULATORY_SURGERY_CENTER): Payer: 59 | Admitting: Internal Medicine

## 2023-01-19 VITALS — BP 122/66 | HR 69 | Temp 97.3°F | Resp 16 | Ht 68.0 in | Wt 145.6 lb

## 2023-01-19 DIAGNOSIS — Z09 Encounter for follow-up examination after completed treatment for conditions other than malignant neoplasm: Secondary | ICD-10-CM | POA: Diagnosis present

## 2023-01-19 DIAGNOSIS — D120 Benign neoplasm of cecum: Secondary | ICD-10-CM

## 2023-01-19 DIAGNOSIS — Z86010 Personal history of colonic polyps: Secondary | ICD-10-CM

## 2023-01-19 MED ORDER — SODIUM CHLORIDE 0.9 % IV SOLN: 500.0000 mL | Freq: Once | INTRAVENOUS | Status: DC

## 2023-01-19 NOTE — Progress Notes (Signed)
HISTORY OF PRESENT ILLNESS:  [name] [name] is a 57 y.o. male with a history of adenomatous colon polyp 2017.  Now for surveillance.  No complaints  REVIEW OF SYSTEMS:  All non-GI ROS negative except for  Past Medical History:  Diagnosis Date   Allergy    Arthritis    Chronic pericarditis 11/28/2017   Elevated cholesterol 08/15/2021   Essential hypertension 11/28/2017   Heart murmur    Hypertension    Kidney stones    Lyme disease    Need for shingles vaccine 11/25/2021   Pneumonia    Rhinorrhea 09/16/2014   - Added flutter valve to rx 08/02/14  - ent referral 08/14/2014 > [facility] eval c/w severe fungal laryngitis > improved on diflucan - allergy profile 08/14/14  >  IgE 39 with neg RAST Sinus ct 10/17/2014 > Minimal mucosal thickening RIGHT maxillary sinus. No additional sinus abnormalities identified.   Thrombocytosis 08/15/2021   Type 2 diabetes mellitus with hyperglycemia, without long-term current use of insulin 03/19/2017    Past Surgical History:  Procedure Laterality Date   COLONOSCOPY  2017   Dr. Marina Goodell   HERNIA REPAIR      Social History [name] [name]  reports that he has never smoked. He has never used smokeless tobacco. He reports current alcohol use. He reports that he does not use drugs.  family history includes Asthma in his mother; Cancer in his father and mother; Diabetes in his father, maternal grandmother, and mother; Heart disease in his maternal grandfather and mother; Kidney disease in his father; Lung cancer in his mother; Stroke in his daughter.  No Known Allergies     PHYSICAL EXAMINATION: Vital signs: BP 122/76   Pulse 67   Temp (!) 97.3 F (36.3 C)   Ht 5\' 8"  (1.727 m)   Wt 145 lb 9.6 oz (66 kg)   SpO2 100%   BMI 22.14 kg/m  General: Well-developed, well-nourished, no acute distress HEENT: Sclerae are anicteric, conjunctiva pink. Oral mucosa intact Lungs: Clear Heart: Regular Abdomen: soft, nontender, nondistended, no obvious ascites, no peritoneal  signs, normal bowel sounds. No organomegaly. Extremities: No edema Psychiatric: alert and oriented x3. Cooperative     ASSESSMENT:   History of adenomatous colon polyp 2017  PLAN:  Surveillance colonoscopy

## 2023-01-19 NOTE — Progress Notes (Signed)
Called to room to assist during endoscopic procedure.  Patient ID and intended procedure confirmed with present staff. Received instructions for my participation in the procedure from the performing physician.  

## 2023-01-19 NOTE — Progress Notes (Signed)
Pt resting comfortably. VSS. Airway intact. SBAR complete to RN. All questions answered.   

## 2023-01-19 NOTE — Op Note (Signed)
[facility] Patient Name: [name] [name] Procedure Date: 01/19/2023 11:02 AM MRN: [id number] Endoscopist: Wilhemina Bonito. Marina Goodell , MD, [id number] Age: 57 Referring MD:  Date of Birth: 01/24/1966 Gender: Male Account #: [id number] Procedure:                Colonoscopy with cold snare polypectomy x 1 Indications:              High risk colon cancer surveillance: Personal                            history of non-advanced adenoma. Index exam 2017 Medicines:                Monitored Anesthesia Care Procedure:                Pre-Anesthesia Assessment:                           - Prior to the procedure, a History and Physical                            was performed, and patient medications and                            allergies were reviewed. The patient's tolerance of                            previous anesthesia was also reviewed. The risks                            and benefits of the procedure and the sedation                            options and risks were discussed with the patient.                            All questions were answered, and informed consent                            was obtained. Prior Anticoagulants: The patient has                            taken no anticoagulant or antiplatelet agents. ASA                            Grade Assessment: II - A patient with mild systemic                            disease. After reviewing the risks and benefits,                            the patient was deemed in satisfactory condition to                            undergo the procedure.  After obtaining informed consent, the colonoscope                            was passed under direct vision. Throughout the                            procedure, the patient's blood pressure, pulse, and                            oxygen saturations were monitored continuously. The                            Olympus CF-HQ190L ([id number]) Colonoscope was                             introduced through the anus and advanced to the the                            cecum, identified by appendiceal orifice and                            ileocecal valve. The ileocecal valve, appendiceal                            orifice, and rectum were photographed. The quality                            of the bowel preparation was excellent. The                            colonoscopy was performed without difficulty. The                            patient tolerated the procedure well. The bowel                            preparation used was SUPREP via split dose                            instruction. Scope In: 11:22:17 AM Scope Out: 11:33:44 AM Scope Withdrawal Time: 0 hours 9 minutes 35 seconds  Total Procedure Duration: 0 hours 11 minutes 27 seconds  Findings:                 A 6 mm polyp was found in the cecum. The polyp was                            removed with a cold snare. Resection and retrieval                            were complete.                           The exam was otherwise without abnormality on  direct and retroflexion views. Complications:            No immediate complications. Estimated blood loss:                            None. Estimated Blood Loss:     Estimated blood loss: none. Impression:               - One 6 mm polyp in the cecum, removed with a cold                            snare. Resected and retrieved.                           - The examination was otherwise normal on direct                            and retroflexion views. Recommendation:           - Repeat colonoscopy in 7 years for surveillance.                           - Patient has a contact number available for                            emergencies. The signs and symptoms of potential                            delayed complications were discussed with the                            patient. Return to normal activities tomorrow.                             Written discharge instructions were provided to the                            patient.                           - Resume previous diet.                           - Continue present medications.                           - Await pathology results. Wilhemina Bonito. Marina Goodell, MD 01/19/2023 11:38:46 AM This report has been signed electronically.

## 2023-01-19 NOTE — Patient Instructions (Signed)
Discharge instructions given. Handout on polyps. Resume previous medications. YOU HAD AN ENDOSCOPIC PROCEDURE TODAY AT THE [facility]:   Refer to the procedure report that was given to you for any specific questions about what was found during the examination.  If the procedure report does not answer your questions, please call your gastroenterologist to clarify.  If you requested that your care partner not be given the details of your procedure findings, then the procedure report has been included in a sealed envelope for you to review at your convenience later.  YOU SHOULD EXPECT: Some feelings of bloating in the abdomen. Passage of more gas than usual.  Walking can help get rid of the air that was put into your GI tract during the procedure and reduce the bloating. If you had a lower endoscopy (such as a colonoscopy or flexible sigmoidoscopy) you may notice spotting of blood in your stool or on the toilet paper. If you underwent a bowel prep for your procedure, you may not have a normal bowel movement for a few days.  Please Note:  You might notice some irritation and congestion in your nose or some drainage.  This is from the oxygen used during your procedure.  There is no need for concern and it should clear up in a day or so.  SYMPTOMS TO REPORT IMMEDIATELY:  Following lower endoscopy (colonoscopy or flexible sigmoidoscopy):  Excessive amounts of blood in the stool  Significant tenderness or worsening of abdominal pains  Swelling of the abdomen that is new, acute  Fever of 100F or higher   For urgent or emergent issues, a gastroenterologist can be reached at any hour by calling [phone]. Do not use MyChart messaging for urgent concerns.    DIET:  We do recommend a small meal at first, but then you may proceed to your regular diet.  Drink plenty of fluids but you should avoid alcoholic beverages for 24 hours.  ACTIVITY:  You should plan to take it easy for the rest  of today and you should NOT DRIVE or use heavy machinery until tomorrow (because of the sedation medicines used during the test).    FOLLOW UP: Our staff will call the number listed on your records the next business day following your procedure.  We will call around 7:15- 8:00 am to check on you and address any questions or concerns that you may have regarding the information given to you following your procedure. If we do not reach you, we will leave a message.     If any biopsies were taken you will be contacted by phone or by letter within the next 1-3 weeks.  Please call us at [phone] if you have not heard about the biopsies in 3 weeks.    SIGNATURES/CONFIDENTIALITY: You and/or your care partner have signed paperwork which will be entered into your electronic medical record.  These signatures attest to the fact that that the information above on your After Visit Summary has been reviewed and is understood.  Full responsibility of the confidentiality of this discharge information lies with you and/or your care-partner.   

## 2023-01-19 NOTE — Progress Notes (Signed)
VS by DT  Pt's states no medical or surgical changes since previsit or office visit.  

## 2023-01-20 ENCOUNTER — Telehealth: Payer: Self-pay

## 2023-01-20 NOTE — Telephone Encounter (Signed)
  Follow up Call-     01/19/2023   11:01 AM  Call back number  Post procedure Call Back phone  # [phone]  Permission to leave phone message Yes     Patient questions:  Do you have a fever, pain , or abdominal swelling? No. Pain Score  0 *  Have you tolerated food without any problems? Yes.    Have you been able to return to your normal activities? Yes.    Do you have any questions about your discharge instructions: Diet   No. Medications  No. Follow up visit  No.  Do you have questions or concerns about your Care? No.  Actions: * If pain score is 4 or above: No action needed, pain <4.

## 2023-01-22 ENCOUNTER — Encounter: Payer: Self-pay | Admitting: Internal Medicine

## 2023-02-04 ENCOUNTER — Ambulatory Visit: Payer: 59 | Admitting: Dermatology

## 2023-02-15 ENCOUNTER — Other Ambulatory Visit: Payer: Self-pay | Admitting: Family Medicine

## 2023-02-15 DIAGNOSIS — I10 Essential (primary) hypertension: Secondary | ICD-10-CM

## 2023-04-02 ENCOUNTER — Other Ambulatory Visit: Payer: Self-pay | Admitting: Family Medicine

## 2023-04-02 DIAGNOSIS — F418 Other specified anxiety disorders: Secondary | ICD-10-CM

## 2023-04-21 ENCOUNTER — Other Ambulatory Visit: Payer: Self-pay | Admitting: Family Medicine

## 2023-04-21 DIAGNOSIS — E1165 Type 2 diabetes mellitus with hyperglycemia: Secondary | ICD-10-CM

## 2023-05-20 ENCOUNTER — Encounter: Payer: Self-pay | Admitting: Family Medicine

## 2023-05-20 ENCOUNTER — Ambulatory Visit: Payer: 59 | Admitting: Family Medicine

## 2023-05-20 VITALS — BP 120/64 | HR 57 | Temp 98.0°F | Ht 68.0 in | Wt 156.2 lb

## 2023-05-20 DIAGNOSIS — E7800 Pure hypercholesterolemia, unspecified: Secondary | ICD-10-CM | POA: Diagnosis not present

## 2023-05-20 DIAGNOSIS — E1165 Type 2 diabetes mellitus with hyperglycemia: Secondary | ICD-10-CM | POA: Diagnosis not present

## 2023-05-20 DIAGNOSIS — I10 Essential (primary) hypertension: Secondary | ICD-10-CM

## 2023-05-20 DIAGNOSIS — F418 Other specified anxiety disorders: Secondary | ICD-10-CM | POA: Diagnosis not present

## 2023-05-20 LAB — CBC
HCT: 43.2 % (ref 39.0–52.0)
Hemoglobin: 14 g/dL (ref 13.0–17.0)
MCHC: 32.3 g/dL (ref 30.0–36.0)
MCV: 86.7 fl (ref 78.0–100.0)
Platelets: 417 10*3/uL — ABNORMAL HIGH (ref 150.0–400.0)
RBC: 4.98 Mil/uL (ref 4.22–5.81)
RDW: 14.5 % (ref 11.5–15.5)
WBC: 7.7 10*3/uL (ref 4.0–10.5)

## 2023-05-20 LAB — BASIC METABOLIC PANEL
BUN: 14 mg/dL (ref 6–23)
CO2: 30 mEq/L (ref 19–32)
Calcium: 10.1 mg/dL (ref 8.4–10.5)
Chloride: 100 mEq/L (ref 96–112)
Creatinine, Ser: 0.8 mg/dL (ref 0.40–1.50)
GFR: 97.79 mL/min (ref >60.00)
Glucose, Bld: 127 mg/dL — ABNORMAL HIGH (ref 70–99)
Potassium: 4.6 mEq/L (ref 3.5–5.1)
Sodium: 137 mEq/L (ref 135–145)

## 2023-05-20 LAB — LIPID PANEL
Cholesterol: 153 mg/dL (ref 0–200)
HDL: 51.5 mg/dL (ref >39.00)
LDL Cholesterol: 81 mg/dL (ref 0–99)
NonHDL: 101.48
Total CHOL/HDL Ratio: 3
Triglycerides: 103 mg/dL (ref 0.0–149.0)
VLDL: 20.6 mg/dL (ref 0.0–40.0)

## 2023-05-20 LAB — HEMOGLOBIN A1C: Hgb A1c MFr Bld: 6.8 % — ABNORMAL HIGH (ref 4.6–6.5)

## 2023-05-20 NOTE — Progress Notes (Signed)
Established Patient Office Visit   Subjective:  Patient ID: [name] [name], male    DOB: 11/24/1965  Age: 57 y.o. MRN: [id number]  Chief Complaint  Patient presents with   Medical Management of Chronic Issues    6 month follow up. Pt is fasting. No concerns or questions.     HPI Encounter Diagnoses  Name Primary?   Essential hypertension Yes   Type 2 diabetes mellitus with hyperglycemia, without long-term current use of insulin (HCC)    Elevated cholesterol    Depression with anxiety    For follow-up of above status post ongoing treatment with below listed medications.  Continues them all without issue.  Exercising by working around the house in the yard and on building projects.  They continue to watch their 35-year-old granddaughter.  Feels as though the sertraline is helping depression and anxiety.   Review of Systems  Constitutional: Negative.   HENT: Negative.    Eyes:  Negative for blurred vision, discharge and redness.  Respiratory: Negative.    Cardiovascular: Negative.   Gastrointestinal:  Negative for abdominal pain.  Genitourinary: Negative.   Musculoskeletal: Negative.  Negative for myalgias.  Skin:  Negative for rash.  Neurological:  Negative for tingling, loss of consciousness and weakness.  Endo/Heme/Allergies:  Negative for polydipsia.  Psychiatric/Behavioral:  Negative for depression. The patient is not nervous/anxious.      Current Outpatient Medications:    amLODipine (NORVASC) 5 MG tablet, TAKE 1 TABLET (5 MG TOTAL) BY MOUTH DAILY., Disp: 90 tablet, Rfl: 1   aspirin 81 MG tablet, Take 81 mg by mouth daily., Disp: , Rfl:    atorvastatin (LIPITOR) 40 MG tablet, TAKE 1 TABLET BY MOUTH EVERYDAY AT BEDTIME, Disp: 90 tablet, Rfl: 3   calcium carbonate (TUMS - DOSED IN MG ELEMENTAL CALCIUM) 500 MG chewable tablet, Chew 1-2 tablets by mouth 2 (two) times daily as needed for indigestion or heartburn., Disp: , Rfl:    cetirizine (ZYRTEC) 10 MG tablet, Take 1 tablet  (10 mg total) by mouth at bedtime., Disp: 30 tablet, Rfl: 11   Coenzyme Q10 (CO Q 10 PO), Take 1 capsule by mouth at bedtime. , Disp: , Rfl:    Continuous Blood Gluc Receiver (DEXCOM G7 RECEIVER) DEVI, 1 Device by Does not apply route as directed. Use to check glucose level, Disp: , Rfl:    Continuous Blood Gluc Sensor (DEXCOM G7 SENSOR) MISC, 1 Units by Does not apply route as directed. Change every 10 days., Disp: 9 each, Rfl: 3   lisinopril (ZESTRIL) 40 MG tablet, TAKE 1 TABLET BY MOUTH EVERY DAY, Disp: 90 tablet, Rfl: 1   metFORMIN (GLUCOPHAGE) 1000 MG tablet, TAKE 1 TABLET BY MOUTH TWICE A DAY, Disp: 180 tablet, Rfl: 1   Multiple Vitamin (MULTIVITAMIN) tablet, Take 1 tablet by mouth daily. Reported on 12/20/2015, Disp: , Rfl:    Omega-3 Fatty Acids (OMEGA-3 FISH OIL) 1200 MG CAPS, Take 1,200 mg by mouth daily., Disp: , Rfl:    pantoprazole (PROTONIX) 40 MG tablet, Take 40 mg by mouth as needed for indigestion., Disp: , Rfl:    RYBELSUS 14 MG TABS, TAKE 1 TABLET (14 MG TOTAL) BY MOUTH DAILY, Disp: 90 tablet, Rfl: 1   sertraline (ZOLOFT) 50 MG tablet, TAKE 1 TABLET BY MOUTH EVERY DAY, Disp: 90 tablet, Rfl: 2   colchicine 0.6 MG tablet, Take 1 tablet (0.6 mg total) by mouth daily., Disp: 90 tablet, Rfl: 3   Objective:     BP 120/64  Pulse (!) 57   Temp 98 F (36.7 C)   Ht 5\' 8"  (1.727 m)   Wt 156 lb 3.2 oz (70.9 kg)   SpO2 99%   BMI 23.75 kg/m  BP Readings from Last 3 Encounters:  05/20/23 120/64  01/19/23 122/66  11/17/22 124/68   Wt Readings from Last 3 Encounters:  05/20/23 156 lb 3.2 oz (70.9 kg)  01/19/23 145 lb 9.6 oz (66 kg)  12/26/22 148 lb (67.1 kg)      Physical Exam Constitutional:      General: He is not in acute distress.    Appearance: Normal appearance. He is not ill-appearing, toxic-appearing or diaphoretic.  HENT:     Head: Normocephalic and atraumatic.     Right Ear: External ear normal.     Left Ear: External ear normal.     Mouth/Throat:     Mouth:  Mucous membranes are moist.     Pharynx: Oropharynx is clear. No oropharyngeal exudate or posterior oropharyngeal erythema.  Eyes:     General: No scleral icterus.       Right eye: No discharge.        Left eye: No discharge.     Extraocular Movements: Extraocular movements intact.     Conjunctiva/sclera: Conjunctivae normal.     Pupils: Pupils are equal, round, and reactive to light.  Cardiovascular:     Rate and Rhythm: Normal rate and regular rhythm.  Pulmonary:     Effort: Pulmonary effort is normal. No respiratory distress.     Breath sounds: Normal breath sounds.  Abdominal:     General: Bowel sounds are normal.     Tenderness: There is no right CVA tenderness or left CVA tenderness.  Musculoskeletal:     Cervical back: No rigidity or tenderness.  Skin:    General: Skin is warm and dry.  Neurological:     Mental Status: He is alert and oriented to person, place, and time.  Psychiatric:        Mood and Affect: Mood normal.        Behavior: Behavior normal.      No results found for any visits on 05/20/23.    The 10-year ASCVD risk score (Arnett DK, et al., 2019) is: 10.8%    Assessment & Plan:   Essential hypertension -     Basic metabolic panel -     CBC  Type 2 diabetes mellitus with hyperglycemia, without long-term current use of insulin (HCC) -     Basic metabolic panel -     Hemoglobin A1c  Elevated cholesterol -     Lipid panel  Depression with anxiety    Return in about 6 months (around 11/20/2023).  Continue all current medications as directed.  Encourage 30 minutes of exercise most days of the week.  Suggested couples counseling.  Will have his next eye exam in January.  Mliss Sax, MD

## 2023-07-01 ENCOUNTER — Other Ambulatory Visit: Payer: Self-pay | Admitting: Family Medicine

## 2023-07-01 DIAGNOSIS — I10 Essential (primary) hypertension: Secondary | ICD-10-CM

## 2023-08-08 ENCOUNTER — Other Ambulatory Visit: Payer: Self-pay | Admitting: Family Medicine

## 2023-08-08 DIAGNOSIS — I319 Disease of pericardium, unspecified: Secondary | ICD-10-CM

## 2023-09-26 ENCOUNTER — Other Ambulatory Visit: Payer: Self-pay | Admitting: Family Medicine

## 2023-09-26 DIAGNOSIS — E119 Type 2 diabetes mellitus without complications: Secondary | ICD-10-CM

## 2023-10-15 ENCOUNTER — Other Ambulatory Visit: Payer: Self-pay | Admitting: Family Medicine

## 2023-10-15 DIAGNOSIS — E1165 Type 2 diabetes mellitus with hyperglycemia: Secondary | ICD-10-CM

## 2023-10-23 LAB — OPHTHALMOLOGY REPORT-SCANNED

## 2023-11-04 ENCOUNTER — Other Ambulatory Visit: Payer: Self-pay | Admitting: Family Medicine

## 2023-11-04 DIAGNOSIS — I10 Essential (primary) hypertension: Secondary | ICD-10-CM

## 2023-11-08 ENCOUNTER — Other Ambulatory Visit: Payer: Self-pay | Admitting: Family Medicine

## 2023-11-08 DIAGNOSIS — E1165 Type 2 diabetes mellitus with hyperglycemia: Secondary | ICD-10-CM

## 2023-11-20 ENCOUNTER — Ambulatory Visit: Payer: 59 | Admitting: Family Medicine

## 2023-12-29 ENCOUNTER — Other Ambulatory Visit: Payer: Self-pay | Admitting: Family Medicine

## 2023-12-29 DIAGNOSIS — F418 Other specified anxiety disorders: Secondary | ICD-10-CM

## 2023-12-31 ENCOUNTER — Ambulatory Visit: Payer: 59 | Admitting: Family Medicine

## 2024-01-05 ENCOUNTER — Telehealth: Payer: Self-pay | Admitting: Family Medicine

## 2024-01-05 NOTE — Telephone Encounter (Signed)
 12/31/2023 late arrival (9:37a for 9:20a appt), 1st missed visit, letter sent via [facility]

## 2024-01-08 ENCOUNTER — Encounter: Payer: Self-pay | Admitting: Family Medicine

## 2024-01-08 ENCOUNTER — Ambulatory Visit: Admitting: Family Medicine

## 2024-01-08 VITALS — BP 120/70 | HR 70 | Temp 98.1°F | Ht 68.0 in | Wt 163.2 lb

## 2024-01-08 DIAGNOSIS — Z0000 Encounter for general adult medical examination without abnormal findings: Secondary | ICD-10-CM

## 2024-01-08 DIAGNOSIS — F418 Other specified anxiety disorders: Secondary | ICD-10-CM

## 2024-01-08 DIAGNOSIS — E7800 Pure hypercholesterolemia, unspecified: Secondary | ICD-10-CM | POA: Diagnosis not present

## 2024-01-08 DIAGNOSIS — Z125 Encounter for screening for malignant neoplasm of prostate: Secondary | ICD-10-CM | POA: Diagnosis not present

## 2024-01-08 DIAGNOSIS — I10 Essential (primary) hypertension: Secondary | ICD-10-CM

## 2024-01-08 DIAGNOSIS — E1165 Type 2 diabetes mellitus with hyperglycemia: Secondary | ICD-10-CM

## 2024-01-08 DIAGNOSIS — Z7984 Long term (current) use of oral hypoglycemic drugs: Secondary | ICD-10-CM

## 2024-01-08 DIAGNOSIS — I319 Disease of pericardium, unspecified: Secondary | ICD-10-CM

## 2024-01-08 DIAGNOSIS — E119 Type 2 diabetes mellitus without complications: Secondary | ICD-10-CM

## 2024-01-08 LAB — COMPREHENSIVE METABOLIC PANEL WITH GFR
ALT: 21 U/L (ref 0–53)
AST: 10 U/L (ref 0–37)
Albumin: 4.7 g/dL (ref 3.5–5.2)
Alkaline Phosphatase: 86 U/L (ref 39–117)
BUN: 10 mg/dL (ref 6–23)
CO2: 32 meq/L (ref 19–32)
Calcium: 10.1 mg/dL (ref 8.4–10.5)
Chloride: 100 meq/L (ref 96–112)
Creatinine, Ser: 0.83 mg/dL (ref 0.40–1.50)
GFR: 96.28 mL/min (ref >60.00)
Glucose, Bld: 109 mg/dL — ABNORMAL HIGH (ref 70–99)
Potassium: 4 meq/L (ref 3.5–5.1)
Sodium: 139 meq/L (ref 135–145)
Total Bilirubin: 0.5 mg/dL (ref 0.2–1.2)
Total Protein: 7.2 g/dL (ref 6.0–8.3)

## 2024-01-08 LAB — URINALYSIS, ROUTINE W REFLEX MICROSCOPIC
Bilirubin Urine: NEGATIVE
Hgb urine dipstick: NEGATIVE
Ketones, ur: NEGATIVE
Leukocytes,Ua: NEGATIVE
Nitrite: NEGATIVE
RBC / HPF: NONE SEEN (ref 0–?)
Specific Gravity, Urine: 1.02 (ref 1.000–1.030)
Total Protein, Urine: NEGATIVE
Urine Glucose: NEGATIVE
Urobilinogen, UA: 0.2 (ref 0.0–1.0)
pH: 8 (ref 5.0–8.0)

## 2024-01-08 LAB — LIPID PANEL
Cholesterol: 135 mg/dL (ref 0–200)
HDL: 44.7 mg/dL (ref >39.00)
LDL Cholesterol: 61 mg/dL (ref 0–99)
NonHDL: 89.94
Total CHOL/HDL Ratio: 3
Triglycerides: 146 mg/dL (ref 0.0–149.0)
VLDL: 29.2 mg/dL (ref 0.0–40.0)

## 2024-01-08 LAB — CBC WITH DIFFERENTIAL/PLATELET
Basophils Absolute: 0 10*3/uL (ref 0.0–0.1)
Basophils Relative: 0.5 % (ref 0.0–3.0)
Eosinophils Absolute: 0.2 10*3/uL (ref 0.0–0.7)
Eosinophils Relative: 3.7 % (ref 0.0–5.0)
HCT: 43.1 % (ref 39.0–52.0)
Hemoglobin: 14.5 g/dL (ref 13.0–17.0)
Lymphocytes Relative: 22.8 % (ref 12.0–46.0)
Lymphs Abs: 1.5 10*3/uL (ref 0.7–4.0)
MCHC: 33.6 g/dL (ref 30.0–36.0)
MCV: 84.7 fl (ref 78.0–100.0)
Monocytes Absolute: 0.5 10*3/uL (ref 0.1–1.0)
Monocytes Relative: 7.7 % (ref 3.0–12.0)
Neutro Abs: 4.2 10*3/uL (ref 1.4–7.7)
Neutrophils Relative %: 65.3 % (ref 43.0–77.0)
Platelets: 427 10*3/uL — ABNORMAL HIGH (ref 150.0–400.0)
RBC: 5.09 Mil/uL (ref 4.22–5.81)
RDW: 14.3 % (ref 11.5–15.5)
WBC: 6.4 10*3/uL (ref 4.0–10.5)

## 2024-01-08 LAB — MICROALBUMIN / CREATININE URINE RATIO
Creatinine,U: 47.2 mg/dL
Microalb Creat Ratio: UNDETERMINED mg/g (ref 0.0–30.0)
Microalb, Ur: <0.7 mg/dL

## 2024-01-08 LAB — PSA: PSA: 0.94 ng/mL (ref 0.10–4.00)

## 2024-01-08 MED ORDER — ATORVASTATIN CALCIUM 40 MG PO TABS: ORAL_TABLET | ORAL | 3 refills | Status: AC

## 2024-01-08 MED ORDER — AMLODIPINE BESYLATE 5 MG PO TABS: 5.0000 mg | ORAL_TABLET | Freq: Every day | ORAL | 3 refills | Status: DC

## 2024-01-08 MED ORDER — DEXCOM G7 SENSOR MISC: 3 refills | Status: AC

## 2024-01-08 MED ORDER — COLCHICINE 0.6 MG PO TABS: 0.6000 mg | ORAL_TABLET | Freq: Every day | ORAL | 3 refills | Status: AC

## 2024-01-08 MED ORDER — RYBELSUS 14 MG PO TABS
14.0000 mg | ORAL_TABLET | Freq: Every day | ORAL | 3 refills | Status: DC
Start: 2024-01-08 — End: 2024-07-11

## 2024-01-08 MED ORDER — LISINOPRIL 40 MG PO TABS: 40.0000 mg | ORAL_TABLET | Freq: Every day | ORAL | 3 refills | Status: AC

## 2024-01-08 MED ORDER — METFORMIN HCL 1000 MG PO TABS: 1000.0000 mg | ORAL_TABLET | Freq: Two times a day (BID) | ORAL | 3 refills | Status: AC

## 2024-01-08 MED ORDER — SERTRALINE HCL 50 MG PO TABS: 50.0000 mg | ORAL_TABLET | Freq: Every day | ORAL | 2 refills | Status: DC

## 2024-01-08 NOTE — Progress Notes (Signed)
 Established Patient Office Visit   Subjective:  Patient ID: [name] [name], male    DOB: 20-May-1966  Age: 58 y.o. MRN: [id number]  Chief Complaint  Patient presents with   Medical Management of Chronic Issues    6 month follow up. Pt is fasting. Pt requested all medications be sent to new pharmacy.     HPI Encounter Diagnoses  Name Primary?   Healthcare maintenance Yes   Essential hypertension    Type 2 diabetes mellitus with hyperglycemia, without long-term current use of insulin (HCC)    Well controlled type 2 diabetes mellitus (HCC)    Chronic pericarditis, unspecified complication status, unspecified type    Depression with anxiety    Screening for prostate cancer    Elevated cholesterol    For physical and follow-up of above.  No regular exercise but is planning to start.  Does have regular dental care.  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   Review of Systems  Constitutional: Negative.   HENT: Negative.    Eyes:  Negative for blurred vision, discharge and redness.  Respiratory: Negative.    Cardiovascular: Negative.   Gastrointestinal:  Negative for abdominal pain.  Genitourinary: Negative.   Musculoskeletal: Negative.  Negative for myalgias.  Skin:  Negative for rash.  Neurological:  Negative for tingling, loss of consciousness and weakness.  Endo/Heme/Allergies:  Negative for polydipsia.     Current Outpatient Medications:    aspirin 81 MG tablet, Take 81 mg by mouth daily., Disp: , Rfl:    calcium carbonate (TUMS - DOSED IN MG ELEMENTAL CALCIUM) 500 MG chewable tablet, Chew 1-2 tablets by mouth 2 (two) times daily as needed for indigestion or heartburn., Disp: , Rfl:    cetirizine (ZYRTEC) 10 MG tablet, Take 1 tablet (10 mg total) by mouth at bedtime., Disp: 30 tablet, Rfl: 11   Coenzyme Q10 (CO Q 10 PO), Take 1 capsule by mouth at bedtime. , Disp: , Rfl:    Continuous Blood Gluc  Receiver (DEXCOM G7 RECEIVER) DEVI, 1 Device by Does not apply route as directed. Use to check glucose level, Disp: , Rfl:    Multiple Vitamin (MULTIVITAMIN) tablet, Take 1 tablet by mouth daily. Reported on 12/20/2015, Disp: , Rfl:    Omega-3 Fatty Acids (OMEGA-3 FISH OIL) 1200 MG CAPS, Take 1,200 mg by mouth daily., Disp: , Rfl:    pantoprazole (PROTONIX) 40 MG tablet, Take 40 mg by mouth as needed for indigestion., Disp: , Rfl:    amLODipine (NORVASC) 5 MG tablet, Take 1 tablet (5 mg total) by mouth daily., Disp: 90 tablet, Rfl: 3   atorvastatin (LIPITOR) 40 MG tablet, TAKE 1 TABLET BY MOUTH EVERYDAY AT BEDTIME, Disp: 90 tablet, Rfl: 3   colchicine 0.6 MG tablet, Take 1 tablet (0.6 mg total) by mouth daily., Disp: 90 tablet, Rfl: 3   Continuous Glucose Sensor (DEXCOM G7 SENSOR) MISC, USE AS DIRECTED. CHANGE EVERY 10 DAYS, Disp: 9 each, Rfl: 3   lisinopril (ZESTRIL) 40 MG tablet, Take 1 tablet (40 mg total) by mouth daily., Disp: 90 tablet, Rfl: 3   metFORMIN (GLUCOPHAGE) 1000 MG tablet, Take 1 tablet (1,000 mg total) by mouth 2 (two) times daily., Disp: 180 tablet, Rfl: 3   Semaglutide (RYBELSUS) 14 MG TABS, Take 1 tablet (14 mg total) by mouth daily., Disp: 90 tablet, Rfl: 3   sertraline (ZOLOFT) 50 MG tablet, Take 1 tablet (  50 mg total) by mouth daily., Disp: 90 tablet, Rfl: 2   Objective:     BP 120/70 (BP Location: Left Arm, Patient Position: Sitting, Cuff Size: Normal)   Pulse 70   Temp 98.1 F (36.7 C) (Temporal)   Ht 5\' 8"  (1.727 m)   Wt 163 lb 3.2 oz (74 kg)   SpO2 97%   BMI 24.81 kg/m    Physical Exam Constitutional:      General: He is not in acute distress.    Appearance: Normal appearance. He is not ill-appearing, toxic-appearing or diaphoretic.  HENT:     Head: Normocephalic and atraumatic.     Right Ear: External ear normal.     Left Ear: External ear normal.     Mouth/Throat:     Mouth: Mucous membranes are moist.     Pharynx: Oropharynx is clear. No oropharyngeal  exudate or posterior oropharyngeal erythema.  Eyes:     General: No scleral icterus.       Right eye: No discharge.        Left eye: No discharge.     Extraocular Movements: Extraocular movements intact.     Conjunctiva/sclera: Conjunctivae normal.     Pupils: Pupils are equal, round, and reactive to light.  Cardiovascular:     Rate and Rhythm: Normal rate and regular rhythm.  Pulmonary:     Effort: Pulmonary effort is normal. No respiratory distress.     Breath sounds: Normal breath sounds.  Abdominal:     General: Bowel sounds are normal.     Tenderness: There is no abdominal tenderness. There is no guarding.     Hernia: There is no hernia in the left inguinal area or right inguinal area.  Genitourinary:    Penis: Circumcised. No phimosis, paraphimosis, hypospadias, erythema, tenderness, discharge, swelling or lesions.      Testes:        Left: Mass, tenderness or swelling not present. Left testis is descended.     Epididymis:     Left: Not inflamed or enlarged. No mass.     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 Musculoskeletal:     Cervical back: No rigidity or tenderness.  Lymphadenopathy:     Lower Body: No right inguinal adenopathy. No left inguinal adenopathy.  Skin:    General: Skin is warm and dry.  Neurological:     Mental Status: He is alert and oriented to person, place, and time.  Psychiatric:        Mood and Affect: Mood normal.        Behavior: Behavior normal.     Diabetic Foot Exam - Simple   Simple Foot Form Diabetic Foot exam was performed with the following findings: Yes 01/08/2024  9:42 AM  Visual Inspection No deformities, no ulcerations, no other skin breakdown bilaterally: Yes Sensation Testing See comments: Yes Pulse Check Posterior Tibialis and Dorsalis pulse intact bilaterally: Yes Comments Occasional tingling in the MTPs that comes and goes.     No results found for any visits on 01/08/24.    The 10-year ASCVD risk score (Arnett DK,  et al., 2019) is: 10.7%    Assessment & Plan:   Healthcare maintenance  Essential hypertension -     Microalbumin / creatinine urine ratio -     Urinalysis, Routine w reflex microscopic -     CBC with Differential/Platelet -     Comprehensive metabolic panel with GFR -     amLODIPine Besylate; Take 1 tablet (5 mg  total) by mouth daily.  Dispense: 90 tablet; Refill: 3 -     Lisinopril; Take 1 tablet (40 mg total) by mouth daily.  Dispense: 90 tablet; Refill: 3  Type 2 diabetes mellitus with hyperglycemia, without long-term current use of insulin (HCC) -     Hemoglobin A1c -     Comprehensive metabolic panel with GFR -     Rybelsus; Take 1 tablet (14 mg total) by mouth daily.  Dispense: 90 tablet; Refill: 3 -     Dexcom G7 Sensor; USE AS DIRECTED. CHANGE EVERY 10 DAYS  Dispense: 9 each; Refill: 3  Well controlled type 2 diabetes mellitus (HCC) -     Hemoglobin A1c -     Microalbumin / creatinine urine ratio -     Comprehensive metabolic panel with GFR -     metFORMIN HCl; Take 1 tablet (1,000 mg total) by mouth 2 (two) times daily.  Dispense: 180 tablet; Refill: 3 -     Atorvastatin Calcium; TAKE 1 TABLET BY MOUTH EVERYDAY AT BEDTIME  Dispense: 90 tablet; Refill: 3  Chronic pericarditis, unspecified complication status, unspecified type -     Colchicine; Take 1 tablet (0.6 mg total) by mouth daily.  Dispense: 90 tablet; Refill: 3  Depression with anxiety -     Sertraline HCl; Take 1 tablet (50 mg total) by mouth daily.  Dispense: 90 tablet; Refill: 2  Screening for prostate cancer -     PSA  Elevated cholesterol -     Lipid panel -     Comprehensive metabolic panel with GFR    Return in about 6 months (around 07/10/2024), or if symptoms worsen or fail to improve.  Continue all medications as above.  Adjustments made pending results of labs.  Information was given on health maintenance and disease prevention.  Mliss Sax, MD

## 2024-01-09 LAB — HEMOGLOBIN A1C: Hgb A1c MFr Bld: 7.3 % — ABNORMAL HIGH (ref 4.6–6.5)

## 2024-07-11 ENCOUNTER — Encounter: Payer: Self-pay | Admitting: Family Medicine

## 2024-07-11 ENCOUNTER — Ambulatory Visit: Admitting: Family Medicine

## 2024-07-11 VITALS — BP 118/68 | HR 64 | Temp 96.9°F | Ht 68.0 in | Wt 160.4 lb

## 2024-07-11 DIAGNOSIS — F418 Other specified anxiety disorders: Secondary | ICD-10-CM | POA: Diagnosis not present

## 2024-07-11 DIAGNOSIS — E538 Deficiency of other specified B group vitamins: Secondary | ICD-10-CM | POA: Diagnosis not present

## 2024-07-11 DIAGNOSIS — Z23 Encounter for immunization: Secondary | ICD-10-CM

## 2024-07-11 DIAGNOSIS — E1165 Type 2 diabetes mellitus with hyperglycemia: Secondary | ICD-10-CM | POA: Diagnosis not present

## 2024-07-11 DIAGNOSIS — M5432 Sciatica, left side: Secondary | ICD-10-CM

## 2024-07-11 DIAGNOSIS — I10 Essential (primary) hypertension: Secondary | ICD-10-CM

## 2024-07-11 DIAGNOSIS — M5431 Sciatica, right side: Secondary | ICD-10-CM

## 2024-07-11 DIAGNOSIS — M65352 Trigger finger, left little finger: Secondary | ICD-10-CM

## 2024-07-11 LAB — COMPREHENSIVE METABOLIC PANEL WITH GFR
ALT: 17 U/L (ref 0–53)
AST: 11 U/L (ref 0–37)
Albumin: 4.7 g/dL (ref 3.5–5.2)
Alkaline Phosphatase: 73 U/L (ref 39–117)
BUN: 16 mg/dL (ref 6–23)
CO2: 28 meq/L (ref 19–32)
Calcium: 10 mg/dL (ref 8.4–10.5)
Chloride: 101 meq/L (ref 96–112)
Creatinine, Ser: 0.77 mg/dL (ref 0.40–1.50)
GFR: 98.13 mL/min (ref >60.00)
Glucose, Bld: 131 mg/dL — ABNORMAL HIGH (ref 70–99)
Potassium: 4.1 meq/L (ref 3.5–5.1)
Sodium: 136 meq/L (ref 135–145)
Total Bilirubin: 0.5 mg/dL (ref 0.2–1.2)
Total Protein: 7.3 g/dL (ref 6.0–8.3)

## 2024-07-11 LAB — HEMOGLOBIN A1C: Hgb A1c MFr Bld: 7.5 % — ABNORMAL HIGH (ref 4.6–6.5)

## 2024-07-11 LAB — VITAMIN B12: Vitamin B-12: 198 pg/mL — ABNORMAL LOW (ref 211–911)

## 2024-07-11 MED ORDER — SEMAGLUTIDE (1 MG/DOSE) 4 MG/3ML ~~LOC~~ SOPN: 1.0000 mg | PEN_INJECTOR | SUBCUTANEOUS | 2 refills | Status: DC

## 2024-07-11 NOTE — Progress Notes (Signed)
 Established Patient Office Visit   Subjective:  Patient ID: [name] [name], male    DOB: [birth year]  Age: 57 y.o. MRN: [id number]  Chief Complaint  Patient presents with   Medical Management of Chronic Issues    6 month follow up. Pt is fasting. No concerns. Flu and Prevnar today.    HPI Encounter Diagnoses  Name Primary?   Essential hypertension Yes   Type 2 diabetes mellitus with hyperglycemia, without long-term current use of insulin (HCC)    Depression with anxiety    Immunization due    B12 deficiency    Trigger little finger of left hand    Bilateral sciatica    Doing well.  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  Less stressful.  Blood pressure is lower and wonders if he could stop one of his medications.  Would like to switch over to Ozempic.  Complains of triggering of his right fifth finger.  After sitting for long periods in his truck he experiences numbness in both buttocks.  Sertraline  continues to work well for him for stress relief   Review of Systems  Constitutional: Negative.  Negative for chills, diaphoresis, malaise/fatigue and weight loss.  HENT: Negative.    Eyes: Negative.  Negative for blurred vision, double vision, discharge and redness.  Respiratory: Negative.    Cardiovascular: Negative.  Negative for chest pain.  Gastrointestinal:  Negative for abdominal pain.  Genitourinary: Negative.   Musculoskeletal: Negative.  Negative for falls and myalgias.  Skin:  Negative for rash.  Neurological:  Negative for tingling, speech change, loss of consciousness and weakness.  Endo/Heme/Allergies:  Negative for polydipsia.  Psychiatric/Behavioral: Negative.        07/11/2024    9:19 AM 07/11/2024    9:17 AM 07/11/2024    9:10 AM  Depression screen PHQ 2/9  Decreased Interest 0 1 0  Down, Depressed, Hopeless 0 1 0  PHQ - 2 Score 0 2 0  Altered sleeping 0 0   Tired, decreased energy 0 0   Change in appetite 0 0    Feeling bad or failure about yourself  0 0   Trouble concentrating 0 1   Moving slowly or fidgety/restless 0 0   Suicidal thoughts 0 0   PHQ-9 Score 0 3   Difficult doing work/chores Not difficult at all Somewhat difficult       Current Outpatient Medications:    aspirin 81 MG tablet, Take 81 mg by mouth daily., Disp: , Rfl:    atorvastatin  (LIPITOR) 40 MG tablet, TAKE 1 TABLET BY MOUTH EVERYDAY AT BEDTIME, Disp: 90 tablet, Rfl: 3   calcium  carbonate (TUMS - DOSED IN MG ELEMENTAL CALCIUM ) 500 MG chewable tablet, Chew 1-2 tablets by mouth 2 (two) times daily as needed for indigestion or heartburn., Disp: , Rfl:    cetirizine  (ZYRTEC ) 10 MG tablet, Take 1 tablet (10 mg total) by mouth at bedtime., Disp: 30 tablet, Rfl: 11   Coenzyme Q10 (CO Q 10 PO), Take 1 capsule by mouth at bedtime. , Disp: , Rfl:    colchicine  0.6 MG tablet, Take 1 tablet (0.6 mg total) by mouth daily., Disp: 90 tablet, Rfl: 3   Continuous Blood Gluc Receiver (DEXCOM G7 RECEIVER) DEVI, 1 Device by Does not apply route as directed. Use to check glucose level, Disp: , Rfl:    Continuous Glucose Sensor (DEXCOM G7 SENSOR) MISC, USE AS DIRECTED. CHANGE EVERY 10 DAYS, Disp: 9  each, Rfl: 3   lisinopril  (ZESTRIL ) 40 MG tablet, Take 1 tablet (40 mg total) by mouth daily., Disp: 90 tablet, Rfl: 3   metFORMIN  (GLUCOPHAGE ) 1000 MG tablet, Take 1 tablet (1,000 mg total) by mouth 2 (two) times daily., Disp: 180 tablet, Rfl: 3   Multiple Vitamin (MULTIVITAMIN) tablet, Take 1 tablet by mouth daily. Reported on 12/20/2015, Disp: , Rfl:    Omega-3 Fatty Acids (OMEGA-3 FISH OIL) 1200 MG CAPS, Take 1,200 mg by mouth daily., Disp: , Rfl:    pantoprazole  (PROTONIX ) 40 MG tablet, Take 40 mg by mouth as needed for indigestion., Disp: , Rfl:    Semaglutide , 1 MG/DOSE, 4 MG/3ML SOPN, Inject 1 mg into the skin once a week., Disp: 3 mL, Rfl: 2   sertraline  (ZOLOFT ) 50 MG tablet, Take 1 tablet (50 mg total) by mouth daily., Disp: 90 tablet, Rfl: 2    Objective:     BP 118/68 (BP Location: Right Arm, Patient Position: Sitting, Cuff Size: Normal)   Pulse 64   Temp (!) 96.9 F (36.1 C) (Temporal)   Ht 5' 8 (1.727 m)   Wt 160 lb 6.4 oz (72.8 kg)   SpO2 98%   BMI 24.39 kg/m  BP Readings from Last 3 Encounters:  07/11/24 118/68  01/08/24 120/70  05/20/23 120/64   Wt Readings from Last 3 Encounters:  07/11/24 160 lb 6.4 oz (72.8 kg)  01/08/24 163 lb 3.2 oz (74 kg)  05/20/23 156 lb 3.2 oz (70.9 kg)      Physical Exam Constitutional:      General: He is not in acute distress.    Appearance: Normal appearance. He is not ill-appearing, toxic-appearing or diaphoretic.  HENT:     Head: Normocephalic and atraumatic.     Right Ear: External ear normal.     Left Ear: External ear normal.  Eyes:     General: No scleral icterus.       Right eye: No discharge.        Left eye: No discharge.     Extraocular Movements: Extraocular movements intact.     Conjunctiva/sclera: Conjunctivae normal.  Pulmonary:     Effort: Pulmonary effort is normal. No respiratory distress.  Musculoskeletal:     Lumbar back: No tenderness or bony tenderness. Normal range of motion. Negative right straight leg raise test and negative left straight leg raise test.  Skin:    General: Skin is warm and dry.  Neurological:     Mental Status: He is alert and oriented to person, place, and time.     Motor: No weakness.     Deep Tendon Reflexes:     Reflex Scores:      Patellar reflexes are 2+ on the right side and 2+ on the left side.      Achilles reflexes are 1+ on the right side and 1+ on the left side. Psychiatric:        Mood and Affect: Mood normal.        Behavior: Behavior normal.      No results found for any visits on 07/11/24.    The 10-year ASCVD risk score (Arnett DK, et al., 2019) is: 11.3%    Assessment & Plan:   Essential hypertension -     Comprehensive metabolic panel with GFR  Type 2 diabetes mellitus with hyperglycemia,  without long-term current use of insulin (HCC) -     Hemoglobin A1c -     Comprehensive metabolic panel with GFR -  Semaglutide  (1 MG/DOSE); Inject 1 mg into the skin once a week.  Dispense: 3 mL; Refill: 2  Depression with anxiety  Immunization due -     Flu vaccine trivalent PF, 6mos and older(Flulaval,Afluria,Fluarix,Fluzone) -     Pneumococcal conjugate vaccine 20-valent  B12 deficiency -     Vitamin B12  Trigger little finger of left hand -     Ambulatory referral to Sports Medicine  Bilateral sciatica -     Ambulatory referral to Sports Medicine    Return in about 3 months (around 10/10/2024), or Stop amlodipine  and continue lisinopril  40 mg daily., for Check and record blood pressures periodically please.  Will switch to 1 mg of Ozempic taken weekly.  He has had few side effects on the 14 mg tablet of Rybelsus .  Advised him to look out for nausea, constipation and/or diarrhea.  He knows that with severe abdominal pain, he may need an emergency valuation secondary to the possibility of otitis, intestinal obstruction and/or gallbladder disease.  Theoretical risk of thyroid cancer.   Elsie Sim Lent, MD

## 2024-07-12 ENCOUNTER — Ambulatory Visit: Payer: Self-pay | Admitting: Family Medicine

## 2024-07-12 MED ORDER — VITAMIN B-12 1000 MCG PO TABS
1000.0000 ug | ORAL_TABLET | Freq: Every day | ORAL | 3 refills | Status: AC
Start: 2024-07-12 — End: ?

## 2024-07-12 NOTE — Addendum Note (Signed)
 Addended by: BERNETA ELSIE LABOR on: 07/12/2024 08:18 AM   Modules accepted: Orders

## 2024-08-09 ENCOUNTER — Other Ambulatory Visit: Payer: Self-pay

## 2024-08-09 ENCOUNTER — Encounter (HOSPITAL_BASED_OUTPATIENT_CLINIC_OR_DEPARTMENT_OTHER): Payer: Self-pay | Admitting: Emergency Medicine

## 2024-08-09 ENCOUNTER — Emergency Department (HOSPITAL_BASED_OUTPATIENT_CLINIC_OR_DEPARTMENT_OTHER)
Admission: EM | Admit: 2024-08-09 | Discharge: 2024-08-09 | Disposition: A | Discharge status: Home / Self Care | Attending: Emergency Medicine | Admitting: Emergency Medicine

## 2024-08-09 ENCOUNTER — Emergency Department (HOSPITAL_BASED_OUTPATIENT_CLINIC_OR_DEPARTMENT_OTHER)

## 2024-08-09 DIAGNOSIS — R519 Headache, unspecified: Secondary | ICD-10-CM | POA: Diagnosis present

## 2024-08-09 DIAGNOSIS — Z7984 Long term (current) use of oral hypoglycemic drugs: Secondary | ICD-10-CM | POA: Insufficient documentation

## 2024-08-09 DIAGNOSIS — E119 Type 2 diabetes mellitus without complications: Secondary | ICD-10-CM | POA: Diagnosis not present

## 2024-08-09 DIAGNOSIS — I10 Essential (primary) hypertension: Secondary | ICD-10-CM | POA: Diagnosis not present

## 2024-08-09 DIAGNOSIS — Z7982 Long term (current) use of aspirin: Secondary | ICD-10-CM | POA: Diagnosis not present

## 2024-08-09 DIAGNOSIS — Z79899 Other long term (current) drug therapy: Secondary | ICD-10-CM | POA: Insufficient documentation

## 2024-08-09 LAB — BASIC METABOLIC PANEL WITH GFR
Anion gap: 12 (ref 5–15)
BUN: 14 mg/dL (ref 6–20)
CO2: 28 mmol/L (ref 22–32)
Calcium: 10.1 mg/dL (ref 8.9–10.3)
Chloride: 99 mmol/L (ref 98–111)
Creatinine, Ser: 0.9 mg/dL (ref 0.61–1.24)
GFR, Estimated: >60 mL/min (ref >60)
Glucose, Bld: 178 mg/dL — ABNORMAL HIGH (ref 70–99)
Potassium: 4.1 mmol/L (ref 3.5–5.1)
Sodium: 139 mmol/L (ref 135–145)

## 2024-08-09 LAB — CBC WITH DIFFERENTIAL/PLATELET
Abs Immature Granulocytes: 0.06 K/uL (ref 0.00–0.07)
Basophils Absolute: 0.1 K/uL (ref 0.0–0.1)
Basophils Relative: 1 %
Eosinophils Absolute: 0.6 K/uL — ABNORMAL HIGH (ref 0.0–0.5)
Eosinophils Relative: 6 %
HCT: 39.6 % (ref 39.0–52.0)
Hemoglobin: 13.4 g/dL (ref 13.0–17.0)
Immature Granulocytes: 1 %
Lymphocytes Relative: 15 %
Lymphs Abs: 1.7 K/uL (ref 0.7–4.0)
MCH: 28.4 pg (ref 26.0–34.0)
MCHC: 33.8 g/dL (ref 30.0–36.0)
MCV: 83.9 fL (ref 80.0–100.0)
Monocytes Absolute: 0.8 K/uL (ref 0.1–1.0)
Monocytes Relative: 7 %
Neutro Abs: 8.3 K/uL — ABNORMAL HIGH (ref 1.7–7.7)
Neutrophils Relative %: 70 %
Platelets: 490 K/uL — ABNORMAL HIGH (ref 150–400)
RBC: 4.72 MIL/uL (ref 4.22–5.81)
RDW: 13.3 % (ref 11.5–15.5)
WBC: 11.6 K/uL — ABNORMAL HIGH (ref 4.0–10.5)
nRBC: 0 % (ref 0.0–0.2)

## 2024-08-09 MED ORDER — PROCHLORPERAZINE EDISYLATE 10 MG/2ML IJ SOLN
10.0000 mg | Freq: Once | INTRAMUSCULAR | Status: AC
  Administered 2024-08-09: 10 mg via INTRAVENOUS
  Filled 2024-08-09: qty 2

## 2024-08-09 MED ORDER — LISINOPRIL 10 MG PO TABS
40.0000 mg | ORAL_TABLET | Freq: Once | ORAL | Status: AC
  Administered 2024-08-09: 40 mg via ORAL
  Filled 2024-08-09: qty 4

## 2024-08-09 MED ORDER — MAGNESIUM SULFATE 2 GM/50ML IV SOLN
2.0000 g | Freq: Once | INTRAVENOUS | Status: AC
  Administered 2024-08-09: 2 g via INTRAVENOUS
  Filled 2024-08-09: qty 50

## 2024-08-09 MED ORDER — METOCLOPRAMIDE HCL 5 MG/ML IJ SOLN
10.0000 mg | Freq: Once | INTRAMUSCULAR | Status: AC
  Administered 2024-08-09: 10 mg via INTRAVENOUS
  Filled 2024-08-09: qty 2

## 2024-08-09 MED ORDER — IOHEXOL 350 MG/ML SOLN
75.0000 mL | Freq: Once | INTRAVENOUS | Status: AC | PRN
  Administered 2024-08-09: 75 mL via INTRAVENOUS

## 2024-08-09 MED ORDER — DIPHENHYDRAMINE HCL 50 MG/ML IJ SOLN
12.5000 mg | Freq: Once | INTRAMUSCULAR | Status: AC
  Administered 2024-08-09: 12.5 mg via INTRAVENOUS
  Filled 2024-08-09: qty 1

## 2024-08-09 MED ORDER — HYDRALAZINE HCL 20 MG/ML IJ SOLN
5.0000 mg | Freq: Once | INTRAMUSCULAR | Status: AC
  Administered 2024-08-09: 5 mg via INTRAVENOUS
  Filled 2024-08-09: qty 1

## 2024-08-09 NOTE — ED Triage Notes (Signed)
 Headache since 08:00 pm. Took ibuprofen  and 1 Asprin then at 10 tylenol  and more Asprin. Took blood pressure was high avg about 201-235/ 98-115. Had URI and seen at [facility] given ABX. Not feeling better.

## 2024-08-09 NOTE — ED Notes (Signed)
 Patient transported to CT

## 2024-08-09 NOTE — ED Provider Notes (Signed)
 [place] [facility] AT [facility] Provider Note   CSN: [id number] Arrival date & time: 08/09/24  9674     Patient presents with: Headache and Hypertension   [name] [name] is a 58 y.o. male.   The history is provided by the patient.  Headache Hypertension Associated symptoms include headaches.  [name] [name] is a 59 y.o. male who presents to the Emergency Department complaining of headache.  He presents to the emergency department accompanied by his wife for evaluation of severe and sudden onset headache that started around 9 PM.  He took Tylenol  as well as aspirin at home without improvement.  He checked his blood pressure at home and it was greater than 200 over greater than 100.  Headache is worse when he lays flat located across both temples.  No associated fever, vomiting, diarrhea, numbness, weakness.  He has been dealing with an upper respiratory infection over the last week and a half with cough and nasal congestion.  He was seen at urgent care at that time and tested negative for COVID.  He was started on doxycycline  last Monday.  He has a history of type 2 diabetes, hypertension.  He takes lisinopril  40 mg daily.  He has not missed any doses.  1 month ago he saw his PCP and his amlodipine  was discontinued due to normal blood pressures.  No prior similar headaches.  No recent injuries.     Prior to Admission medications   Medication Sig Start Date End Date Taking? Authorizing Provider  aspirin 81 MG tablet Take 81 mg by mouth daily.    [provider]  atorvastatin  (LIPITOR) 40 MG tablet TAKE 1 TABLET BY MOUTH EVERYDAY AT BEDTIME 01/08/24   Berneta Elsie Sayre, MD  calcium  carbonate (TUMS - DOSED IN MG ELEMENTAL CALCIUM ) 500 MG chewable tablet Chew 1-2 tablets by mouth 2 (two) times daily as needed for indigestion or heartburn.    [provider]  cetirizine  (ZYRTEC ) 10 MG tablet Take 1 tablet (10 mg total) by mouth at bedtime. 11/25/21    Berneta Elsie Sayre, MD  Coenzyme Q10 (CO Q 10 PO) Take 1 capsule by mouth at bedtime.     [provider]  colchicine  0.6 MG tablet Take 1 tablet (0.6 mg total) by mouth daily. 01/08/24   Berneta Elsie Sayre, MD  Continuous Blood Gluc Receiver (DEXCOM G7 RECEIVER) DEVI 1 Device by Does not apply route as directed. Use to check glucose level    [provider]  Continuous Glucose Sensor (DEXCOM G7 SENSOR) MISC USE AS DIRECTED. CHANGE EVERY 10 DAYS 01/08/24   Berneta Elsie Sayre, MD  cyanocobalamin (VITAMIN B12) 1000 MCG tablet Take 1 tablet (1,000 mcg total) by mouth daily. 07/12/24   Berneta Elsie Sayre, MD  lisinopril  (ZESTRIL ) 40 MG tablet Take 1 tablet (40 mg total) by mouth daily. 01/08/24   Berneta Elsie Sayre, MD  metFORMIN  (GLUCOPHAGE ) 1000 MG tablet Take 1 tablet (1,000 mg total) by mouth 2 (two) times daily. 01/08/24   Berneta Elsie Sayre, MD  Multiple Vitamin (MULTIVITAMIN) tablet Take 1 tablet by mouth daily. Reported on 12/20/2015    [provider]  Omega-3 Fatty Acids (OMEGA-3 FISH OIL) 1200 MG CAPS Take 1,200 mg by mouth daily.    [provider]  pantoprazole  (PROTONIX ) 40 MG tablet Take 40 mg by mouth as needed for indigestion.    [provider]  Semaglutide , 1 MG/DOSE, 4 MG/3ML SOPN Inject 1 mg into the skin once a  week. 07/11/24   Berneta Elsie Sayre, MD  sertraline  (ZOLOFT ) 50 MG tablet Take 1 tablet (50 mg total) by mouth daily. 01/08/24   Berneta Elsie Sayre, MD    Allergies: Patient has no known allergies.    Review of Systems  Neurological:  Positive for headaches.  All other systems reviewed and are negative.   Updated Vital Signs BP (!) 185/87   Pulse 64   Temp (!) 97.5 F (36.4 C)   Resp 14   Ht 5' 8 (1.727 m)   Wt 72.6 kg   SpO2 98%   BMI 24.33 kg/m   Physical Exam Vitals and nursing note reviewed.  Constitutional:      Appearance: He is well-developed.  HENT:     Head: Normocephalic and  atraumatic.  Eyes:     Extraocular Movements: Extraocular movements intact.     Pupils: Pupils are equal, round, and reactive to light.  Cardiovascular:     Rate and Rhythm: Normal rate and regular rhythm.     Heart sounds: No murmur heard. Pulmonary:     Effort: Pulmonary effort is normal. No respiratory distress.     Breath sounds: Normal breath sounds.  Abdominal:     Palpations: Abdomen is soft.     Tenderness: There is no abdominal tenderness. There is no guarding or rebound.  Musculoskeletal:        General: No tenderness.     Cervical back: Neck supple.  Skin:    General: Skin is warm and dry.  Neurological:     Mental Status: He is alert and oriented to person, place, and time.     Comments: No asymmetry of facial movements.  5 out of 5 strength in all 4 extremities with sensation to light touch intact in all 4 extremities  Psychiatric:        Behavior: Behavior normal.     (all labs ordered are listed, but only abnormal results are displayed) Labs Reviewed  BASIC METABOLIC PANEL WITH GFR - Abnormal; Notable for the following components:      Result Value   Glucose, Bld 178 (*)    All other components within normal limits  CBC WITH DIFFERENTIAL/PLATELET - Abnormal; Notable for the following components:   WBC 11.6 (*)    Platelets 490 (*)    Neutro Abs 8.3 (*)    Eosinophils Absolute 0.6 (*)    All other components within normal limits    EKG: EKG Interpretation Date/Time:  Tuesday August 09 2024 04:16:15 EDT Ventricular Rate:  53 PR Interval:  154 QRS Duration:  86 QT Interval:  457 QTC Calculation: 430 R Axis:   47  Text Interpretation: Sinus rhythm Confirmed by Griselda Norris [id number]) on 08/09/2024 4:22:33 AM  Radiology: CT Angio Head Neck W WO CM Result Date: 08/09/2024 EXAM: CTA HEAD AND NECK WITH AND WITHOUT 08/09/2024 05:01:02 AM TECHNIQUE: CTA of the head and neck was performed with and without the administration of 150 mL Omni 350 IV contrast.  Multiplanar 2D and/or 3D reformatted images are provided for review. Automated exposure control, iterative reconstruction, and/or weight based adjustment of the mA/kV was utilized to reduce the radiation dose to as low as reasonably achievable. Stenosis of the internal carotid arteries measured using NASCET criteria. COMPARISON: None available CLINICAL HISTORY: Stroke, hemorrhagic. Headache and HTN with no headache relief from OTC meds. Hx chronic pericarditis, HTN, and diabetes. Scan repeated due to technical scanner issues not including neck on first pass. 75 mls Omni 350  IV. Additional 75 mls Omni 350 IV. 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 It is uniform in caliber throughout its course. No dissection or arterial injury. No significant stenosis of the brachiocephalic or subclavian arteries. CERVICAL CAROTID ARTERIES: No dissection, arterial injury, or hemodynamically significant stenosis by NASCET criteria. CERVICAL VERTEBRAL ARTERIES: The right vertebral artery is dominant. 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 It is uniform in caliber throughout its course. No dissection, arterial injury, or significant stenosis. LUNGS AND MEDIASTINUM: Unremarkable. SOFT TISSUES: No acute abnormality. BONES: No acute abnormality. CTA HEAD: ANTERIOR CIRCULATION: No significant stenosis of the internal carotid arteries. No significant stenosis of the anterior cerebral arteries. No significant stenosis of the middle cerebral arteries. No aneurysm. POSTERIOR CIRCULATION: The right vertebral artery is dominant. 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 It is uniform in caliber throughout its course. No significant stenosis of the posterior cerebral arteries. No significant stenosis of the basilar artery. No aneurysm. OTHER: No dural venous sinus thrombosis  on this non-dedicated study. There is mucosal disease within the right maxillary sinus. IMPRESSION: 1. No large vessel occlusion, hemodynamically significant stenosis, or aneurysm in the head or neck. 2. 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 Electronically signed by: Evalene Coho MD 08/09/2024 05:23 AM EDT RP Workstation: [id number]     Procedures   Medications Ordered in the ED  metoCLOPramide (REGLAN) injection 10 mg (10 mg Intravenous Given 08/09/24 0417)  diphenhydrAMINE (BENADRYL) injection 12.5 mg (12.5 mg Intravenous Given 08/09/24 0417)  iohexol (OMNIPAQUE) 350 MG/ML injection 75 mL (75 mLs Intravenous Contrast Given 08/09/24 0433)  magnesium sulfate IVPB 2 g 50 mL (0 g Intravenous Stopped 08/09/24 0528)  hydrALAZINE (APRESOLINE) injection 5 mg (5 mg Intravenous Given 08/09/24 0500)  lisinopril  (ZESTRIL ) tablet 40 mg (40 mg Oral Given 08/09/24 0535)  prochlorperazine (COMPAZINE) injection 10 mg (10 mg Intravenous Given 08/09/24 0535)                                    Medical Decision Making Amount and/or Complexity of Data Reviewed Labs: ordered. Radiology: ordered.  Risk Prescription drug management.   Patient with history of hypertension, diabetes here for evaluation of severe and sudden onset headache.  Patient hypertensive at time of ED arrival but nontoxic-appearing with no focal neurologic deficits.  CTA is negative for aneurysm or subarachnoid hemorrhage.  Current picture is not consistent with meningitis, dural sinus thrombosis.  His headache resolved with medications for headache as well as for his blood pressure.  His amlodipine  was recently discontinued, recommend that this get restarted.  CBC with mild leukocytosis, no evidence of acute infectious process at this time.  No clinical evidence of pneumonia.  Renal function is within normal limits.   Discussed with patient home care for headache, hypertension.  Feel he is stable to discharge home with outpatient follow-up and return precautions.  Blood pressure did improve after medications and treatment in the department.     Final diagnoses:  Bad headache  Essential hypertension    ED Discharge Orders     None          Griselda Norris, MD 08/09/24 [phone]

## 2024-09-13 ENCOUNTER — Other Ambulatory Visit: Payer: Self-pay | Admitting: Family Medicine

## 2024-09-13 DIAGNOSIS — F418 Other specified anxiety disorders: Secondary | ICD-10-CM

## 2024-09-13 DIAGNOSIS — E1165 Type 2 diabetes mellitus with hyperglycemia: Secondary | ICD-10-CM

## 2024-09-14 NOTE — Telephone Encounter (Signed)
 Refill request received for 2 medications. Sertraline  50mg  and Ozempic  1mg  FOV:10/10/2024 LOV:07/11/2024 Last refill:01/08/2024 and 07/11/2024 Medications sent

## 2024-09-29 ENCOUNTER — Other Ambulatory Visit: Payer: Self-pay | Admitting: Family Medicine

## 2024-09-29 DIAGNOSIS — E1165 Type 2 diabetes mellitus with hyperglycemia: Secondary | ICD-10-CM

## 2024-10-03 ENCOUNTER — Telehealth: Payer: Self-pay

## 2024-10-03 ENCOUNTER — Other Ambulatory Visit (HOSPITAL_COMMUNITY): Payer: Self-pay

## 2024-10-03 NOTE — Telephone Encounter (Signed)
 Pharmacy Patient Advocate Encounter   Received notification from Onbase that prior authorization for Ozempic  (1 MG/DOSE) 4MG /3ML pen-injectors  is required/requested.   Insurance verification completed.   The patient is insured through [employer] [facility].   Per test claim: PA required; PA submitted to above mentioned insurance via Latent Key/confirmation #/EOC [id number] Status is pending

## 2024-10-04 ENCOUNTER — Other Ambulatory Visit (HOSPITAL_COMMUNITY): Payer: Self-pay

## 2024-10-04 NOTE — Telephone Encounter (Signed)
 Pharmacy Patient Advocate Encounter   Received notification from Onbase that prior authorization for Ozempic  (1 MG/DOSE) 4MG /3ML pen-injectors  is required/requested.   Insurance verification completed.   The patient is insured through [employer].   Per test claim: Refill too soon. PA is not needed at this time. Medication was filled 09/14/2024. Next eligible fill date is 10/05/2024.

## 2024-10-07 ENCOUNTER — Other Ambulatory Visit: Payer: Self-pay | Admitting: Family Medicine

## 2024-10-07 DIAGNOSIS — F418 Other specified anxiety disorders: Secondary | ICD-10-CM

## 2024-10-10 ENCOUNTER — Ambulatory Visit (INDEPENDENT_AMBULATORY_CARE_PROVIDER_SITE_OTHER)

## 2024-10-10 ENCOUNTER — Other Ambulatory Visit: Payer: Self-pay | Admitting: Family Medicine

## 2024-10-10 ENCOUNTER — Ambulatory Visit: Admitting: Family Medicine

## 2024-10-10 ENCOUNTER — Encounter: Payer: Self-pay | Admitting: Family Medicine

## 2024-10-10 VITALS — BP 122/88 | HR 66 | Temp 98.0°F | Ht 68.0 in | Wt 157.2 lb

## 2024-10-10 DIAGNOSIS — J101 Influenza due to other identified influenza virus with other respiratory manifestations: Secondary | ICD-10-CM

## 2024-10-10 DIAGNOSIS — E538 Deficiency of other specified B group vitamins: Secondary | ICD-10-CM | POA: Diagnosis not present

## 2024-10-10 DIAGNOSIS — E1165 Type 2 diabetes mellitus with hyperglycemia: Secondary | ICD-10-CM

## 2024-10-10 DIAGNOSIS — I10 Essential (primary) hypertension: Secondary | ICD-10-CM | POA: Diagnosis not present

## 2024-10-10 LAB — CBC WITH DIFFERENTIAL/PLATELET
Basophils Absolute: 0 K/uL (ref 0.0–0.1)
Basophils Relative: 0.9 % (ref 0.0–3.0)
Eosinophils Absolute: 0.2 K/uL (ref 0.0–0.7)
Eosinophils Relative: 5.7 % — ABNORMAL HIGH (ref 0.0–5.0)
HCT: 39.9 % (ref 39.0–52.0)
Hemoglobin: 13.5 g/dL (ref 13.0–17.0)
Lymphocytes Relative: 35.4 % (ref 12.0–46.0)
Lymphs Abs: 1.4 K/uL (ref 0.7–4.0)
MCHC: 33.9 g/dL (ref 30.0–36.0)
MCV: 82.9 fl (ref 78.0–100.0)
Monocytes Absolute: 0.4 K/uL (ref 0.1–1.0)
Monocytes Relative: 11.3 % (ref 3.0–12.0)
Neutro Abs: 1.8 K/uL (ref 1.4–7.7)
Neutrophils Relative %: 46.7 % (ref 43.0–77.0)
Platelets: 344 K/uL (ref 150.0–400.0)
RBC: 4.82 Mil/uL (ref 4.22–5.81)
RDW: 14.4 % (ref 11.5–15.5)
WBC: 3.9 K/uL — ABNORMAL LOW (ref 4.0–10.5)

## 2024-10-10 LAB — BASIC METABOLIC PANEL WITH GFR
BUN: 10 mg/dL (ref 6–23)
CO2: 29 meq/L (ref 19–32)
Calcium: 9.3 mg/dL (ref 8.4–10.5)
Chloride: 100 meq/L (ref 96–112)
Creatinine, Ser: 0.74 mg/dL (ref 0.40–1.50)
GFR: 99.14 mL/min
Glucose, Bld: 118 mg/dL — ABNORMAL HIGH (ref 70–99)
Potassium: 4.5 meq/L (ref 3.5–5.1)
Sodium: 138 meq/L (ref 135–145)

## 2024-10-10 LAB — HEMOGLOBIN A1C: Hgb A1c MFr Bld: 7.3 % — ABNORMAL HIGH (ref 4.6–6.5)

## 2024-10-10 LAB — VITAMIN B12: Vitamin B-12: 656 pg/mL (ref 211–911)

## 2024-10-10 MED ORDER — AMLODIPINE BESYLATE 10 MG PO TABS: 10.0000 mg | ORAL_TABLET | Freq: Every day | ORAL | 1 refills | Status: AC

## 2024-10-10 NOTE — Progress Notes (Signed)
 "  Established Patient Office Visit   Subjective:  Patient ID: [name] [name], male    DOB: [birth year]  Age: 58 y.o. MRN: [id number]  Chief Complaint  Patient presents with   Medical Management of Chronic Issues    Pt presents today for a 3 mon f/u. Pt stopped the amlodipine  but re continued to take it due to prev ER visit for BP reading, his bp was 220/130. Pt states he restarted the amlodipine  to be cautionary. Other than the prev high reading, pt states his bP is running around 130/70 and as low as 110/68     HPI Encounter Diagnoses  Name Primary?   Type 2 diabetes mellitus with hyperglycemia, without long-term current use of insulin (HCC) Yes   Essential hypertension    B12 deficiency    Influenza A    For follow-up of above.  Blood pressure has been elevated since discontinuation of the amlodipine .  He has been taking it about 6 weeks and BPs at home have been in the 130s to 140s over 80s.  He took some decongestants and developed a headache.  At this time he found that his blood pressure was 210/190 and went to the emergency room.  Seen recently at urgent care and diagnosed with influenza A.  He was started on Tamiflu and a Z-Pak.  He is no longer febrile.  Cough remains productive of yellow phlegm.  There has been some wheezing.  Has been taking high-dose B12.   Review of Systems  Constitutional: Negative.  Negative for chills and fever.  HENT: Negative.    Eyes:  Negative for blurred vision, discharge and redness.  Respiratory:  Positive for cough, sputum production and wheezing.   Cardiovascular: Negative.   Gastrointestinal:  Negative for abdominal pain.  Genitourinary: Negative.   Musculoskeletal: Negative.  Negative for myalgias.  Skin:  Negative for rash.  Neurological:  Positive for headaches. Negative for tingling, loss of consciousness and weakness.  Endo/Heme/Allergies:  Negative for polydipsia.      10/10/2024    8:38 AM 07/11/2024    9:19 AM 07/11/2024    9:17 AM   Depression screen PHQ 2/9  Decreased Interest 0 0 1  Down, Depressed, Hopeless 0 0 1  PHQ - 2 Score 0 0 2  Altered sleeping 0 0 0  Tired, decreased energy 0 0 0  Change in appetite 0 0 0  Feeling bad or failure about yourself  0 0 0  Trouble concentrating 0 0 1  Moving slowly or fidgety/restless 0 0 0  Suicidal thoughts 0 0 0  PHQ-9 Score 0 0  3   Difficult doing work/chores Not difficult at all Not difficult at all Somewhat difficult     Data saved with a previous flowsheet row definition      Current Medications[1]   Objective:     BP 122/88   Pulse 66   Temp 98 F (36.7 C)   Ht 5' 8 (1.727 m)   Wt 157 lb 3.2 oz (71.3 kg)   SpO2 98%   BMI 23.90 kg/m  BP Readings from Last 3 Encounters:  10/10/24 122/88  08/09/24 (!) 185/87  07/11/24 118/68   Wt Readings from Last 3 Encounters:  10/10/24 157 lb 3.2 oz (71.3 kg)  08/09/24 160 lb (72.6 kg)  07/11/24 160 lb 6.4 oz (72.8 kg)      Physical Exam Constitutional:      General: He is not in acute distress.    Appearance: Normal  appearance. He is not ill-appearing, toxic-appearing or diaphoretic.  HENT:     Head: Normocephalic and atraumatic.     Right Ear: Tympanic membrane, ear canal and external ear normal.     Left Ear: Tympanic membrane, ear canal and external ear normal.     Mouth/Throat:     Mouth: Mucous membranes are moist.     Pharynx: Oropharynx is clear. No oropharyngeal exudate or posterior oropharyngeal erythema.  Eyes:     General: No scleral icterus.       Right eye: No discharge.        Left eye: No discharge.     Extraocular Movements: Extraocular movements intact.     Conjunctiva/sclera: Conjunctivae normal.     Pupils: Pupils are equal, round, and reactive to light.  Cardiovascular:     Rate and Rhythm: Normal rate and regular rhythm.  Pulmonary:     Effort: Pulmonary effort is normal. No respiratory distress.     Breath sounds: Rhonchi present. No wheezing.  Abdominal:     General:  Bowel sounds are normal.     Tenderness: There is no abdominal tenderness. There is no guarding.  Musculoskeletal:     Cervical back: No rigidity or tenderness.  Lymphadenopathy:     Cervical: No cervical adenopathy.  Skin:    General: Skin is warm and dry.  Neurological:     Mental Status: He is alert and oriented to person, place, and time.  Psychiatric:        Mood and Affect: Mood normal.        Behavior: Behavior normal.      No results found for any visits on 10/10/24.    The 10-year ASCVD risk score (Arnett DK, et al., 2019) is: 11.9%    Assessment & Plan:   Type 2 diabetes mellitus with hyperglycemia, without long-term current use of insulin (HCC) -     Basic metabolic panel with GFR -     Hemoglobin A1c  Essential hypertension -     Basic metabolic panel with GFR -     amLODIPine  Besylate; Take 1 tablet (10 mg total) by mouth daily.  Dispense: 90 tablet; Refill: 1  B12 deficiency -     Vitamin B12  Influenza A -     CBC with Differential/Platelet -     DG Chest 2 View; Future    Return in about 3 months (around 01/08/2025), or Please follow-up in 1 week if cough is not improving., for chronic disease follow-up, annual physical.  Have increased amlodipine  to 10 mg daily.  Continue with lisinopril .  Discussed sarcopenia with GLP-1 agonist and the need for resistance training with weights or rubber bands.  Elsie Sim Lent, MD    [1]  Current Outpatient Medications:    amLODipine  (NORVASC ) 10 MG tablet, Take 1 tablet (10 mg total) by mouth daily., Disp: 90 tablet, Rfl: 1   aspirin 81 MG tablet, Take 81 mg by mouth daily., Disp: , Rfl:    atorvastatin  (LIPITOR) 40 MG tablet, TAKE 1 TABLET BY MOUTH EVERYDAY AT BEDTIME, Disp: 90 tablet, Rfl: 3   calcium  carbonate (TUMS - DOSED IN MG ELEMENTAL CALCIUM ) 500 MG chewable tablet, Chew 1-2 tablets by mouth 2 (two) times daily as needed for indigestion or heartburn., Disp: , Rfl:    cetirizine  (ZYRTEC ) 10 MG  tablet, Take 1 tablet (10 mg total) by mouth at bedtime., Disp: 30 tablet, Rfl: 11   Coenzyme Q10 (CO Q 10 PO), Take 1  capsule by mouth at bedtime. , Disp: , Rfl:    colchicine  0.6 MG tablet, Take 1 tablet (0.6 mg total) by mouth daily., Disp: 90 tablet, Rfl: 3   Continuous Blood Gluc Receiver (DEXCOM G7 RECEIVER) DEVI, 1 Device by Does not apply route as directed. Use to check glucose level, Disp: , Rfl:    Continuous Glucose Sensor (DEXCOM G7 SENSOR) MISC, USE AS DIRECTED. CHANGE EVERY 10 DAYS, Disp: 9 each, Rfl: 3   cyanocobalamin  (VITAMIN B12) 1000 MCG tablet, Take 1 tablet (1,000 mcg total) by mouth daily., Disp: 90 tablet, Rfl: 3   lisinopril  (ZESTRIL ) 40 MG tablet, Take 1 tablet (40 mg total) by mouth daily., Disp: 90 tablet, Rfl: 3   metFORMIN  (GLUCOPHAGE ) 1000 MG tablet, Take 1 tablet (1,000 mg total) by mouth 2 (two) times daily., Disp: 180 tablet, Rfl: 3   Multiple Vitamin (MULTIVITAMIN) tablet, Take 1 tablet by mouth daily. Reported on 12/20/2015, Disp: , Rfl:    Omega-3 Fatty Acids (OMEGA-3 FISH OIL) 1200 MG CAPS, Take 1,200 mg by mouth daily., Disp: , Rfl:    OZEMPIC , 1 MG/DOSE, 4 MG/3ML SOPN, DIAL AND INJECT UNDER THE SKIN 1 MG WEEKLY, Disp: 3 mL, Rfl: 0   pantoprazole  (PROTONIX ) 40 MG tablet, Take 40 mg by mouth as needed for indigestion., Disp: , Rfl:    sertraline  (ZOLOFT ) 50 MG tablet, TAKE 1 TABLET BY MOUTH DAILY, Disp: 90 tablet, Rfl: 0  "

## 2024-10-11 ENCOUNTER — Ambulatory Visit: Payer: Self-pay | Admitting: Family Medicine

## 2024-10-11 DIAGNOSIS — E1165 Type 2 diabetes mellitus with hyperglycemia: Secondary | ICD-10-CM

## 2024-10-31 ENCOUNTER — Encounter: Payer: Self-pay | Admitting: Family Medicine

## 2024-10-31 ENCOUNTER — Other Ambulatory Visit: Payer: Self-pay | Admitting: Family Medicine

## 2024-10-31 DIAGNOSIS — E1165 Type 2 diabetes mellitus with hyperglycemia: Secondary | ICD-10-CM

## 2024-11-01 ENCOUNTER — Other Ambulatory Visit: Payer: Self-pay

## 2024-11-01 DIAGNOSIS — E1165 Type 2 diabetes mellitus with hyperglycemia: Secondary | ICD-10-CM

## 2024-11-01 MED ORDER — OZEMPIC (1 MG/DOSE) 4 MG/3ML ~~LOC~~ SOPN: 1.0000 mg | PEN_INJECTOR | SUBCUTANEOUS | 1 refills | Status: AC

## 2025-01-09 ENCOUNTER — Ambulatory Visit: Admitting: Family Medicine
